# Patient Record
Sex: Female | Born: 1960 | Race: White | Hispanic: No | Marital: Married | Smoking: Never smoker
Health system: Southern US, Community
[De-identification: ages and names within clinical notes are randomized; demographics above are authoritative.]

## PROBLEM LIST (undated history)

## (undated) DIAGNOSIS — G8929 Other chronic pain: Secondary | ICD-10-CM

## (undated) DIAGNOSIS — K589 Irritable bowel syndrome without diarrhea: Secondary | ICD-10-CM

## (undated) DIAGNOSIS — G4733 Obstructive sleep apnea (adult) (pediatric): Secondary | ICD-10-CM

## (undated) DIAGNOSIS — M549 Dorsalgia, unspecified: Secondary | ICD-10-CM

## (undated) DIAGNOSIS — N390 Urinary tract infection, site not specified: Secondary | ICD-10-CM

## (undated) DIAGNOSIS — J45909 Unspecified asthma, uncomplicated: Secondary | ICD-10-CM

## (undated) DIAGNOSIS — Z9989 Dependence on other enabling machines and devices: Secondary | ICD-10-CM

## (undated) DIAGNOSIS — K602 Anal fissure, unspecified: Secondary | ICD-10-CM

## (undated) DIAGNOSIS — Z8719 Personal history of other diseases of the digestive system: Secondary | ICD-10-CM

## (undated) DIAGNOSIS — G5600 Carpal tunnel syndrome, unspecified upper limb: Secondary | ICD-10-CM

## (undated) DIAGNOSIS — R911 Solitary pulmonary nodule: Secondary | ICD-10-CM

## (undated) DIAGNOSIS — G43909 Migraine, unspecified, not intractable, without status migrainosus: Secondary | ICD-10-CM

## (undated) DIAGNOSIS — E039 Hypothyroidism, unspecified: Secondary | ICD-10-CM

## (undated) DIAGNOSIS — K648 Other hemorrhoids: Secondary | ICD-10-CM

## (undated) HISTORY — PX: OTHER SURGICAL HISTORY: SHX169

## (undated) HISTORY — DX: Dependence on other enabling machines and devices: Z99.89

## (undated) HISTORY — PX: NASAL SINUS SURGERY: SHX719

## (undated) HISTORY — DX: Dorsalgia, unspecified: M54.9

## (undated) HISTORY — PX: LYMPH NODE BIOPSY: SHX201

## (undated) HISTORY — DX: Other chronic pain: G89.29

## (undated) HISTORY — DX: Anal fissure, unspecified: K60.2

## (undated) HISTORY — DX: Solitary pulmonary nodule: R91.1

## (undated) HISTORY — DX: Urinary tract infection, site not specified: N39.0

## (undated) HISTORY — DX: Unspecified asthma, uncomplicated: J45.909

## (undated) HISTORY — DX: Carpal tunnel syndrome, unspecified upper limb: G56.00

## (undated) HISTORY — PX: EXCISION OF BREAST BIOPSY: SHX5822

## (undated) HISTORY — PX: HEMORRHOID SURGERY: SHX153

## (undated) HISTORY — PX: TONSILLECTOMY: SUR1361

## (undated) HISTORY — DX: Irritable bowel syndrome, unspecified: K58.9

## (undated) HISTORY — DX: Personal history of other diseases of the digestive system: Z87.19

## (undated) HISTORY — DX: Hypothyroidism, unspecified: E03.9

## (undated) HISTORY — DX: Other hemorrhoids: K64.8

## (undated) HISTORY — DX: Obstructive sleep apnea (adult) (pediatric): G47.33

## (undated) HISTORY — PX: TOTAL VAGINAL HYSTERECTOMY: SHX2548

## (undated) HISTORY — DX: Migraine, unspecified, not intractable, without status migrainosus: G43.909

## (undated) HISTORY — PX: BREAST EXCISIONAL BIOPSY: SUR124

---

## 1998-08-27 ENCOUNTER — Other Ambulatory Visit: Admission: RE | Admit: 1998-08-27 | Discharge: 1998-08-27 | Payer: Self-pay | Admitting: Obstetrics & Gynecology

## 1998-09-26 ENCOUNTER — Ambulatory Visit (HOSPITAL_COMMUNITY): Admission: RE | Admit: 1998-09-26 | Discharge: 1998-09-26 | Payer: Self-pay | Admitting: Obstetrics and Gynecology

## 1998-10-17 ENCOUNTER — Ambulatory Visit (HOSPITAL_COMMUNITY): Admission: RE | Admit: 1998-10-17 | Discharge: 1998-10-17 | Payer: Self-pay | Admitting: Obstetrics & Gynecology

## 1998-10-23 ENCOUNTER — Ambulatory Visit (HOSPITAL_COMMUNITY): Admission: RE | Admit: 1998-10-23 | Discharge: 1998-10-23 | Payer: Self-pay | Admitting: Obstetrics & Gynecology

## 1999-05-21 ENCOUNTER — Other Ambulatory Visit: Admission: RE | Admit: 1999-05-21 | Discharge: 1999-05-21 | Payer: Self-pay | Admitting: Obstetrics & Gynecology

## 1999-10-28 ENCOUNTER — Inpatient Hospital Stay (HOSPITAL_COMMUNITY): Admission: AD | Admit: 1999-10-28 | Discharge: 1999-10-28 | Payer: Self-pay | Admitting: Obstetrics and Gynecology

## 1999-11-04 ENCOUNTER — Encounter: Payer: Self-pay | Admitting: Obstetrics & Gynecology

## 1999-11-04 ENCOUNTER — Inpatient Hospital Stay (HOSPITAL_COMMUNITY): Admission: AD | Admit: 1999-11-04 | Discharge: 1999-11-04 | Payer: Self-pay | Admitting: Obstetrics & Gynecology

## 1999-12-12 ENCOUNTER — Encounter (INDEPENDENT_AMBULATORY_CARE_PROVIDER_SITE_OTHER): Payer: Self-pay

## 1999-12-12 ENCOUNTER — Inpatient Hospital Stay (HOSPITAL_COMMUNITY): Admission: AD | Admit: 1999-12-12 | Discharge: 1999-12-14 | Payer: Self-pay | Admitting: Obstetrics & Gynecology

## 1999-12-15 ENCOUNTER — Encounter: Admission: RE | Admit: 1999-12-15 | Discharge: 2000-02-19 | Payer: Self-pay | Admitting: Obstetrics and Gynecology

## 2000-01-20 ENCOUNTER — Other Ambulatory Visit: Admission: RE | Admit: 2000-01-20 | Discharge: 2000-01-20 | Payer: Self-pay | Admitting: Obstetrics & Gynecology

## 2000-07-14 ENCOUNTER — Observation Stay (HOSPITAL_COMMUNITY): Admission: RE | Admit: 2000-07-14 | Discharge: 2000-07-15 | Payer: Self-pay | Admitting: Obstetrics & Gynecology

## 2000-07-14 ENCOUNTER — Encounter (INDEPENDENT_AMBULATORY_CARE_PROVIDER_SITE_OTHER): Payer: Self-pay | Admitting: Specialist

## 2001-04-19 ENCOUNTER — Other Ambulatory Visit: Admission: RE | Admit: 2001-04-19 | Discharge: 2001-04-19 | Payer: Self-pay | Admitting: Obstetrics & Gynecology

## 2002-04-19 ENCOUNTER — Other Ambulatory Visit: Admission: RE | Admit: 2002-04-19 | Discharge: 2002-04-19 | Payer: Self-pay | Admitting: Obstetrics & Gynecology

## 2003-09-12 ENCOUNTER — Other Ambulatory Visit: Admission: RE | Admit: 2003-09-12 | Discharge: 2003-09-12 | Payer: Self-pay | Admitting: Obstetrics & Gynecology

## 2003-09-25 ENCOUNTER — Encounter: Admission: RE | Admit: 2003-09-25 | Discharge: 2003-09-25 | Payer: Self-pay | Admitting: Obstetrics & Gynecology

## 2003-11-04 ENCOUNTER — Encounter: Admission: RE | Admit: 2003-11-04 | Discharge: 2003-11-04 | Payer: Self-pay | Admitting: Obstetrics & Gynecology

## 2004-02-11 ENCOUNTER — Encounter (INDEPENDENT_AMBULATORY_CARE_PROVIDER_SITE_OTHER): Payer: Self-pay | Admitting: Specialist

## 2004-02-11 ENCOUNTER — Ambulatory Visit (HOSPITAL_COMMUNITY): Admission: RE | Admit: 2004-02-11 | Discharge: 2004-02-11 | Payer: Self-pay | Admitting: *Deleted

## 2004-02-11 ENCOUNTER — Ambulatory Visit (HOSPITAL_BASED_OUTPATIENT_CLINIC_OR_DEPARTMENT_OTHER): Admission: RE | Admit: 2004-02-11 | Discharge: 2004-02-11 | Payer: Self-pay | Admitting: *Deleted

## 2004-10-08 ENCOUNTER — Other Ambulatory Visit: Admission: RE | Admit: 2004-10-08 | Discharge: 2004-10-08 | Payer: Self-pay | Admitting: Obstetrics & Gynecology

## 2004-11-25 ENCOUNTER — Encounter: Admission: RE | Admit: 2004-11-25 | Discharge: 2004-11-25 | Payer: Self-pay | Admitting: Obstetrics & Gynecology

## 2004-12-25 ENCOUNTER — Encounter (INDEPENDENT_AMBULATORY_CARE_PROVIDER_SITE_OTHER): Payer: Self-pay | Admitting: Specialist

## 2004-12-25 ENCOUNTER — Ambulatory Visit (HOSPITAL_BASED_OUTPATIENT_CLINIC_OR_DEPARTMENT_OTHER): Admission: RE | Admit: 2004-12-25 | Discharge: 2004-12-25 | Payer: Self-pay | Admitting: *Deleted

## 2004-12-25 ENCOUNTER — Ambulatory Visit (HOSPITAL_COMMUNITY): Admission: RE | Admit: 2004-12-25 | Discharge: 2004-12-25 | Payer: Self-pay | Admitting: *Deleted

## 2005-02-04 ENCOUNTER — Ambulatory Visit: Payer: Self-pay | Admitting: Internal Medicine

## 2005-02-24 ENCOUNTER — Ambulatory Visit: Payer: Self-pay

## 2005-10-28 ENCOUNTER — Other Ambulatory Visit: Admission: RE | Admit: 2005-10-28 | Discharge: 2005-10-28 | Payer: Self-pay | Admitting: Obstetrics & Gynecology

## 2005-12-01 ENCOUNTER — Encounter: Admission: RE | Admit: 2005-12-01 | Discharge: 2005-12-01 | Payer: Self-pay | Admitting: Obstetrics & Gynecology

## 2006-11-30 ENCOUNTER — Encounter: Admission: RE | Admit: 2006-11-30 | Discharge: 2006-11-30 | Payer: Self-pay | Admitting: Obstetrics & Gynecology

## 2007-01-12 ENCOUNTER — Ambulatory Visit: Payer: Self-pay | Admitting: Cardiology

## 2007-02-01 ENCOUNTER — Ambulatory Visit: Payer: Self-pay | Admitting: Cardiology

## 2007-02-01 ENCOUNTER — Inpatient Hospital Stay (HOSPITAL_BASED_OUTPATIENT_CLINIC_OR_DEPARTMENT_OTHER): Admission: RE | Admit: 2007-02-01 | Discharge: 2007-02-01 | Payer: Self-pay | Admitting: Cardiology

## 2007-02-23 ENCOUNTER — Ambulatory Visit: Payer: Self-pay | Admitting: Cardiology

## 2007-06-01 ENCOUNTER — Encounter: Admission: RE | Admit: 2007-06-01 | Discharge: 2007-06-01 | Payer: Self-pay | Admitting: Obstetrics & Gynecology

## 2007-07-06 ENCOUNTER — Ambulatory Visit: Payer: Self-pay | Admitting: Internal Medicine

## 2007-07-11 ENCOUNTER — Ambulatory Visit (HOSPITAL_COMMUNITY): Admission: RE | Admit: 2007-07-11 | Discharge: 2007-07-11 | Payer: Self-pay | Admitting: Internal Medicine

## 2007-07-12 ENCOUNTER — Ambulatory Visit (HOSPITAL_COMMUNITY): Admission: RE | Admit: 2007-07-12 | Discharge: 2007-07-12 | Payer: Self-pay | Admitting: Internal Medicine

## 2007-07-25 ENCOUNTER — Ambulatory Visit: Payer: Self-pay | Admitting: Internal Medicine

## 2007-08-01 ENCOUNTER — Ambulatory Visit (HOSPITAL_COMMUNITY): Admission: RE | Admit: 2007-08-01 | Discharge: 2007-08-01 | Payer: Self-pay | Admitting: Internal Medicine

## 2007-08-11 ENCOUNTER — Ambulatory Visit: Payer: Self-pay | Admitting: Internal Medicine

## 2007-08-11 ENCOUNTER — Ambulatory Visit (HOSPITAL_COMMUNITY): Admission: RE | Admit: 2007-08-11 | Discharge: 2007-08-11 | Payer: Self-pay | Admitting: Internal Medicine

## 2007-08-17 DIAGNOSIS — R05 Cough: Secondary | ICD-10-CM

## 2007-08-17 DIAGNOSIS — R0602 Shortness of breath: Secondary | ICD-10-CM | POA: Insufficient documentation

## 2007-08-17 DIAGNOSIS — R059 Cough, unspecified: Secondary | ICD-10-CM | POA: Insufficient documentation

## 2007-08-17 DIAGNOSIS — R079 Chest pain, unspecified: Secondary | ICD-10-CM | POA: Insufficient documentation

## 2008-01-10 ENCOUNTER — Encounter: Admission: RE | Admit: 2008-01-10 | Discharge: 2008-01-10 | Payer: Self-pay | Admitting: Obstetrics & Gynecology

## 2008-02-20 ENCOUNTER — Encounter: Admission: RE | Admit: 2008-02-20 | Discharge: 2008-02-20 | Payer: Self-pay | Admitting: Obstetrics & Gynecology

## 2008-03-20 ENCOUNTER — Telehealth (INDEPENDENT_AMBULATORY_CARE_PROVIDER_SITE_OTHER): Payer: Self-pay | Admitting: *Deleted

## 2008-03-20 DIAGNOSIS — J984 Other disorders of lung: Secondary | ICD-10-CM | POA: Insufficient documentation

## 2008-07-15 ENCOUNTER — Ambulatory Visit: Payer: Self-pay | Admitting: Cardiology

## 2008-07-16 ENCOUNTER — Ambulatory Visit: Payer: Self-pay | Admitting: Internal Medicine

## 2008-08-12 ENCOUNTER — Ambulatory Visit: Payer: Self-pay | Admitting: Cardiology

## 2009-06-20 ENCOUNTER — Encounter: Admission: RE | Admit: 2009-06-20 | Discharge: 2009-06-20 | Payer: Self-pay | Admitting: Obstetrics & Gynecology

## 2009-09-22 ENCOUNTER — Ambulatory Visit: Payer: Self-pay | Admitting: Psychology

## 2009-09-27 ENCOUNTER — Encounter: Admission: RE | Admit: 2009-09-27 | Discharge: 2009-09-27 | Payer: Self-pay | Admitting: Neurology

## 2010-01-08 ENCOUNTER — Telehealth (INDEPENDENT_AMBULATORY_CARE_PROVIDER_SITE_OTHER): Payer: Self-pay | Admitting: *Deleted

## 2010-01-20 ENCOUNTER — Telehealth (INDEPENDENT_AMBULATORY_CARE_PROVIDER_SITE_OTHER): Payer: Self-pay | Admitting: *Deleted

## 2010-03-06 ENCOUNTER — Encounter: Payer: Self-pay | Admitting: Cardiology

## 2010-03-10 ENCOUNTER — Encounter: Payer: Self-pay | Admitting: Cardiology

## 2010-03-18 ENCOUNTER — Ambulatory Visit: Payer: Self-pay | Admitting: Cardiology

## 2010-03-18 DIAGNOSIS — R011 Cardiac murmur, unspecified: Secondary | ICD-10-CM | POA: Insufficient documentation

## 2010-04-13 ENCOUNTER — Ambulatory Visit: Payer: Self-pay

## 2010-04-13 ENCOUNTER — Ambulatory Visit: Payer: Self-pay | Admitting: Cardiovascular Disease

## 2010-04-13 ENCOUNTER — Encounter: Payer: Self-pay | Admitting: Cardiology

## 2010-04-13 ENCOUNTER — Ambulatory Visit (HOSPITAL_COMMUNITY): Admission: RE | Admit: 2010-04-13 | Discharge: 2010-04-13 | Payer: Self-pay | Admitting: Cardiology

## 2010-06-23 ENCOUNTER — Encounter: Admission: RE | Admit: 2010-06-23 | Discharge: 2010-06-23 | Payer: Self-pay | Admitting: Obstetrics & Gynecology

## 2010-11-22 ENCOUNTER — Encounter: Payer: Self-pay | Admitting: Obstetrics & Gynecology

## 2010-11-23 ENCOUNTER — Encounter: Payer: Self-pay | Admitting: Internal Medicine

## 2010-12-03 NOTE — Progress Notes (Signed)
Summary: office manage to call her not nurse  Phone Note Call from Patient Call back at Home Phone [phone]   Caller: Patient Reason for Call: Talk to Nurse Details for Reason: Per pt calling, doesn't want to speak with the [place] office. wants to come to [place] office if possible.  want office manager to call her not the nurse.  Initial call taken by: Lorne Skeens,  January 08, 2010 3:10 PM  Follow-up for Phone Call        Phone Call Completed

## 2010-12-03 NOTE — Progress Notes (Signed)
Summary: Talked with Ahlam on January 20, 2010  Talked with Lynden Ang on March 22,2011 she said that her husband has an appointment with Dr. Juanda Chance in [place].  I asked her if she needed too talk with me about any dissatisfaction she or her husband may have with the [facility] clinic. She said that she did not need too talk.

## 2010-12-03 NOTE — Assessment & Plan Note (Signed)
Summary: np6/jss   Primary Provider:  Dr. Vernell Leep  CC:  new patient/ pt to establish with Dr. Shirlee Latch.  Pt concerned with chest pain that has  been intermittent over the last year.  This as patient states was not a concern for Dr Andee Lineman and so she is switching physicians..  History of Present Illness: 50 yo with history of asthma and atypical chest pain presents for evaluation of chest pain.  Patient has had atypical chest pain for at least 2-3 years. In 2008, she had a normal left heart cath. She gets episodes of sharp, left-sided chest pain about 2-3 times a week.  They have increased in frequency recently. Pain lasts 5-10 minutes.  It is not associated with exertion and not associated with wheezing.  She does report some shortness of breath and chest tightness when she walks up the hill to get to her garden.  She has noticed this over the past 2 weeks since she started working in her garden.  She has not trouble with a flight of steps.    ECG: NSR, normal  Labs (5/11): creatinine 0.9, LDL 123, HDL 45, TGs 105, HCT 33.6   Current Medications (verified): 1)  Singulair 10 Mg  Tabs (Montelukast Sodium) .... At Bedtime 2)  Proventil Hfa 108 (90 Base) Mcg/act  Aers (Albuterol Sulfate) .... As Needed 3)  Xanax 0.5 Mg  Tabs (Alprazolam) .... As Needed 4)  Albuterol Sulfate 0.63 Mg/74ml  Nebu (Albuterol Sulfate) .... As Needed 5)  Topamax 100 Mg Tabs (Topiramate) .... Once Daily 6)  Cymbalta 30 Mg Cpep (Duloxetine Hcl) .... Once Daily 7)  Flexeril 10 Mg Tabs (Cyclobenzaprine Hcl) .... 1/2 Tablet Two Times A Day As Needed 8)  Ambien 10 Mg Tabs (Zolpidem Tartrate) .... 1/2 Tablet As Needed 9)  Caltrate 600 1500 Mg Tabs (Calcium Carbonate) .... Once Daily 10)  Vitamin D3 1000 Unit Caps (Cholecalciferol) .... Once Daily 11)  Align  Caps (Probiotic Product) .... Once Daily 12)  Vitamin D (Ergocalciferol) 50000 Unit Caps (Ergocalciferol) .... 2 X Weekly 13)  Miralax  Powd (Polyethylene Glycol 3350)  .... As Needed  Allergies (verified): 1)  ! * Advair 2)  ! Bactrim  Past History:  Past Medical History: 1. 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  2. CHEST PAIN, LEFT (ICD-786.50): Atypical.  Had left heart cath in 7/08 with EF 60%, normal coronaries.  3. Asthma 4. IBS 5. ? fibromyalgia 6. Depression  Family History: Sister with MV prolapse Father with MI at 71 and CHF  Social History: social stress -conflict between her and step daughter Works full time Married, lives in [place] Tobacco Use - No.  Alcohol Use - no Regular Exercise - no Drug Use - no  Review of Systems       All systems reviewed and negative except as per HPI.   Vital Signs:  Patient profile:   50 year old female Height:      60 inches Weight:      142 pounds BMI:     27.83 Pulse rate:   78 / minute Pulse rhythm:   regular BP sitting:   116 / 82  (left arm) Cuff size:   regular  Vitals Entered By: Judithe Modest CMA (Mar 18, 2010 11:45 AM)  Physical Exam  General:  Well developed, well nourished, in no acute distress. Head:  normocephalic and atraumatic Nose:  no deformity, discharge, inflammation, or lesions Mouth:  Teeth, gums and palate normal.  Oral mucosa normal. Neck:  Neck supple, no JVD. No masses, thyromegaly or abnormal cervical nodes. Lungs:  Clear bilaterally to auscultation and percussion. Heart:  Non-displaced PMI, chest non-tender; regular rate and rhythm, S1, S2 without rubs or gallops. 1/6 systolic murmur LLSB/apex.  Carotid upstroke normal, no bruit.  Pedals normal pulses. No edema, no varicosities. Abdomen:  Bowel sounds positive; abdomen soft and non-tender without masses, organomegaly, or hernias noted. No hepatosplenomegaly. Extremities:  No clubbing or cyanosis. Neurologic:  Alert and oriented x 3. Psych:  Normal affect.   Impression & Recommendations:  Problem # 1:  CHEST PAIN, LEFT (ICD-786.50) Chest pain with both  typical and atypical features (primarily atypical).  Negative cath in 2008.  Given some typical features, will have her do a stress echo to look for evidence for ischemia.   Problem # 2:  CARDIAC MURMUR (ICD-785.2) Soft systolic murmur.  Will assess valves with echo.   Other Orders: Echocardiogram (Echo) Stress Echo (Stress Echo)  Patient Instructions: 1)  Your physician has requested that you have an echocardiogram.  Echocardiography is a painless test that uses sound waves to create images of your heart. It provides your doctor with information about the size and shape of your heart and how well your heart's chambers and valves are working.  This procedure takes approximately one hour. There are no restrictions for this procedure. 2)  Your physician has requested that you have a stress echocardiogram. For further information please visit [email or web address]  Please follow instruction sheet as given. 3)  Your physician recommends that you schedule a follow-up appointment as needed with Dr Shirlee Latch.

## 2010-12-03 NOTE — Progress Notes (Signed)
Summary: Returned Catherine Hancock's phone call.  Attempted too call Catherine Hancock left message on answering machine on the following dates: March 17th; March 21st.

## 2011-03-16 NOTE — Assessment & Plan Note (Signed)
[facility]                             PULMONARY OFFICE NOTE   NAME:BURNETTRachyl, Wuebker                      MRN:          [id number]  DATE:08/11/2007                            DOB:          1961/08/18    CHIEF COMPLAINT:  Follow up for:  1. Left sided chest pain with results of bone scan.  2. Shortness of breath, cough. Follow up of methacholine challenge      test results.  3. Right lower lobe nodule. Follow up CT scan in 9-10 months to one      year.  4. Rash after Pneumovax administration at last visit on September      23rd. Status post doxycycline treatment.   HISTORY OF PRESENT ILLNESS:  Catherine Hancock returns for follow up after a  bone scan and methacholine challenge test. She is here with her husband.  They report no complaints. After her last visit on September 23rd and  Pneumovax administration, she picked up cellulitis on her left arm. I  told her to take pictures and I started her on doxycycline treatment.  She showed the pictures to me and it looks like definite cellulitis, but  it has resolved now after doxycycline treatment. In the interim, there  are no symptoms. It is essentially unchanged. She stopped the Symbicort  a few days ago prior to the methacholine challenge test, but she feels  that cough improved with symbicort. Now, even after stopping symbicort  cough has resolved. However, left sided chest pain still persists.   PAST MEDICAL HISTORY:  Unchanged from July 06, 2007.   PAST SURGICAL HISTORY:  Unchanged from July 06, 2007.   CURRENT MEDICATIONS:  Unchanged from July 06, 2007. She is not  taking Symbicort currently.   SOCIAL HISTORY:  Unchanged from July 06, 2007.   FAMILY HISTORY:  Unchanged from July 06, 2007.   REVIEW OF SYSTEMS:  Unchanged from July 06, 2007, except that her  cough is much better.   ALLERGIES:  LATEX.   PHYSICAL EXAMINATION:  VITAL SIGNS:  Weight 142 pounds,  temperature  98.3, blood pressure 112/62, pulse 82, saturation 99% on room air.  GENERAL:  Pleasant lady seated in the exam room comfortably.  CENTRAL NERVOUS SYSTEM:  Awake, alert, and oriented x3. Speech and  ambulation are normal.  CARDIOVASCULAR:  Normal heart sounds. No murmurs or gallops.  NECK:  No neck nodes. Supple. No elevated JVP.  RESPIRATORY:  Air entry equal on both sides with normal breath sounds.  CHEST:  Left lateral tenderness still present.  ABDOMEN:  Soft, nontender.  EXTREMITIES:  No cyanosis, clubbing, or edema.  SKIN:  Intact.  MUSCULOSKELETAL:  No joint deformities.   LABORATORY DATA:  1. Bone scan done on August 01, 2007. The bony tracer distribution      is normal. No focus or abnormal tracer distribution in the left      chest. It is an essentially negative bone scan.  2. Methacholine challenge test. This was done today. Baseline FEV1      prior to challenge was 2.72 liters, which was 120%  predicted. FVC      was 3.45 liters, 118% predicted. FEV1/FVC ratio was 79 (predicted      81). She had serial increments in methacholine at doses of 0.062,      0.025, 1, 4, and then 16 mg/mL. With these serial increments of      methacholine, FeV1 dropped -6% (2.57L) at 4 mg/mL, and then -40%      (FeV1 1.62L) at a dose of 16 mg/mL. Extrapolation shows that PC20      FeV1 was around 7-8 mg/mL. In summary,  per ATS criteria this is a      positive test with borderline bronchial hyper-responsiveness.   ASSESSMENT AND PLAN:  1. Left sided chest pain. Currently, cardiac catheterization is      negative, VQ scan, and bone scans are negative. Pain is chronic. I      do not know the etiology of the pain. I have reassured her that is      not lung cancer, blood clot, or malignancy locally that is causing      this pain. She and her husband felt reassured by this explanation.      She then had a question about whether she should have a breast MRI      versus a mammogram,  and whether there are breast issues that could      cause radiation of this pain. I have told her to follow this up      with a gynecologist.   1. Cough and shortness of breath. Her bronchial hyper-responsiveness      is borderline positive test. It is possible that this is either a      false positive test (more common at 4-16 mg/mL) or that the      symbicort wash-out was not adequate enough and it masked the      positivity of test at doses < 4mg /mL. The latter assumes      signficance esp so becasuse she reports symptom improvement with      symbicort. Regardless, she has no cough now. I have reassured her.      She is not taking symbicort anymore and is cough free. Told her to      retake symbicort if symptoms recur and reassess.   1. Left lower lobe nodule. She will have a CT scan of the chest, non-      contrast, in 10 to 12 months, and then follow up with me.   1. Cellulitis following Pneumovax administration. This is gone. I have      reassured her. I have told her that we filed an incident report.      She seems satisfied with the explanation.   Follow up in 10 to 12 months with CT scan of the chest, in the interim  if there are any problems, she can come back.    Kalman Shan, MD  Electronically Signed   MR/MedQ  DD: 08/13/2007  DT: 08/14/2007  Job #: [id number]

## 2011-03-16 NOTE — Assessment & Plan Note (Signed)
[facility]                             PULMONARY OFFICE NOTE   NAME:BURNETTYanitza, Shvartsman                      MRN:          [id number]  DATE:07/25/2007                            DOB:          Oct 19, 1961    CHIEF COMPLAINT:  Follow up for left sided chest pain, shortness of  breath, and cough.   HISTORY OF PRESENT ILLNESS:  Catherine Hancock is a pleasant 50 year old  female who I last saw on July 06, 2007 for left sided chronic chest  pain, dyspnea for a year, and cough for at least 2 to 3 months. At that  visit, I ordered CT scan of the chest, a VQ scan, and also pulmonary  function tests. She returns today for follow up of these test results  and also to report her status.   In the interim, she continues to have symptoms. They are unchanged. She  did start some Symbicort a few days ago and feels that her left sided  chest pain is a little bit better.   PAST MEDICAL HISTORY:  Unchanged from clinic visit of July 06, 2007.   PAST SURGICAL HISTORY:  Unchanged from July 06, 2007.   CURRENT MEDICATIONS:  Unchanged from July 06, 2007. The only  exception is that she has been taking Symbicort for a few days.   ALLERGIES:  LATEX.   SOCIAL HISTORY:  Unchanged from July 06, 2007.   FAMILY HISTORY:  Unchanged from July 06, 2007.   REVIEW OF SYSTEMS:  Unchanged from July 06, 2007.   PHYSICAL EXAMINATION:  VITAL SIGNS:  Weight 140 pounds, temperature  98.1, blood pressure 110/66, pulse 83, saturation 100% on room air.  GENERAL:  Pleasant lady sitting in the exam room comfortably.  CENTRAL NERVOUS SYSTEM:  Awake, alert, and oriented x3. Speech and  ambulation are normal.  CARDIOVASCULAR:  Normal heart sounds. No murmurs or gallops.  HEENT:  Normal, Mallampati class 1.  NECK:  No neck nodes. Supple. No elevated JVP.  RESPIRATORY:  Air entry equal on both sides, normal breath sounds.  CHEST:  Left axillary region around the 5th to  6th rib space is tender  to palpation, otherwise no deformity.  ABDOMEN:  Soft and nontender, no organomegaly, normal bowel sounds, no  mass.  EXTREMITIES:  No cyanosis, clubbing, or edema.  SKIN:  Intact.  MUSCULOSKELETAL:  No joint deformities.   LABORATORY DATA:  1. VQ scan done on July 10, 2007 is reported as very low      probability for pulmonary embolism.  2. CT scan of the chest, non-contrast, done on July 12, 2007 is      essentially normal except for a 3 millimeter nodule in the right      lower lobe.  3. Pulmonary function testing was done on July 12, 2007 at [facility] [place]. Her FEV1 was 2.96 liters 124%, FVC 3.7 liters 130%,      FEV1/FVC ratio 80, total lung capacity 4.99 liters, 119% predicted,      DLCO at 19.1 ml per millimeter of mercury per minute  which is 117%      predicted. Essentially, the PFTs are normal.   ASSESSMENT AND PLAN:   PROBLEM LIST:  1. Left sided chest pain. Again, there is no etiology yet for this      chest pain. Her prior cardiac catheterization is normal. The      current VQ scan along with her clinical pre-test probability      essentially rules out a pulmonary embolism. A CT scan of the chest      on the left side shows no pulmonary etiology for the chest pain.      Our plan is to proceed with a bone scan to see if there any bony      lesions causing chest pain.   1. PFTs are normal. She seems to get some relief with the Symbicort.      Therefore, this could be cough variant asthma. I will proceed with      getting a methacholine challenge test. I have instructed her that      she is stop her Symbicort at least 5 days before her methacholine      challenge test.   1. Right lower lobe nodule. This is an incidental finding when she was      having a CT scan of the chest for her left sided chest pain. The      nodule is 3 millimeters and is on the right sided. The probability      of lung cancer is extremely low.  The patient is extremely concerned      about lung cancer. She says that she knows of many instances where      patients with such small nodules have been dead a month later with      lung cancer. I spoke to her about the Fleischner criteria and said      that for a nodule of less than 4 millimeters in size in low risk      patients no follow up is warranted. However, given her concern and      family history of cancer, we could assume that she was high risk      and scan her chest again in one year. She is going to think about      this. I have meanwhile written an order for a CT scan of the chest      in one year. I also gave her a copy of the Fleischner criteria from      [employer] textbook of medicine.   1. Health maintenance. She wants Pneumovax and a flu shot, and this      will be administered today in the clinic.   I will see her back again after completion of the methacholine challenge  testing and bone scan.   NOTE: On 07/25/2007 I got a call from Ms. Giebler. She complained of rash  around left shoulder 1 below the pneumovax injection site. Sounded like  cellulits. I called in Doxycycline prescription  for 7 days. I advised reporteing to ER but she opted to treat it at home  with antibioitcs and would get to ER if it worsened. Incident report  filed in clinic on 07/28/2007     Kalman Shan, MD  Electronically Signed    MR/MedQ  DD: 07/25/2007  DT: 07/26/2007  Job #: [id number]
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
## 2011-03-16 NOTE — Assessment & Plan Note (Signed)
[facility]                          [facility] NOTE   Catherine Hancock, Catherine Hancock                      MRN:          [id number]  DATE:08/12/2008                            DOB:          [birth year]    PRIMARY CARE PHYSICIAN:  Dr. Corrie Mckusick.   HISTORY OF PRESENT ILLNESS:  The patient is a very pleasant 50 year old  female with a history of atypical chest pain.  She underwent a prior  cardiac catheterization, which was within normal limits.  She was also  seen by Dr. Kinnie Scales in [place] and had an EGD and colonoscopy.  She  was felt to have gastroesophageal reflux disease.  She also has a long-  standing history of irritable bowel syndrome and is on current treatment  with MiraLax and Align.  More recently, however, the patient feels very  tired and exhausted all the time.  She has also developed pain under  both breasts in a band-like pattern that is worse with certain positions  particularly when lying on the left side.  This does not worsen with  exertion.  She is concerned about this because her mother had similar  symptoms and died from lymphoma.  She is seen by pulmonary doctors for  asthma and also has a known pulmonary nodule, which is stable.  No  further followup CTs have been recommended in the meanwhile.  The  patient in addition also complains of significant insomnia,  irritability, and fatigue.   MEDICATIONS:  Prenatal vitamins, Topamax __________ p.o. daily, B  complex, vitamin D, MiraLax OTC, Align OTC.   PHYSICAL EXAMINATION:  VITAL SIGNS:  Blood pressure 102/66, heart rate  is 76, weight is 138 pounds.  NECK:  Normal carotid upstrokes and no carotid bruits.  LUNGS:  Clear breath sounds bilaterally.  HEART:  Regular rate and rhythm.  Normal S1 and S2.  No murmurs or  gallops.  ABDOMEN:  Soft, nontender.  No rebound or guarding.  Good bowel sounds.  EXTREMITIES:  No cyanosis, clubbing, or edema.   PROBLEM LIST:  1.  Normal cardiac catheterization.  2. Atypical chest pain.  3. Irritable bowel syndrome.  4. Rule out fibromyalgia.  5. Asthma.  6. History of normal cardiac catheterization.  7. History of gastroesophageal reflux and esophageal spasm.   PLAN:  1. I told the patient that I do not think her chest pain is cardiac in      nature.  2. Given some of the symptoms that she complains about and with her      profile of IBS and the fact that she has difficulty sleeping and is      irritable, I am concerned that she could have a mood disorder that      is contributing to her chest pain.  I have given her recommendation      to see Dr. Steele Sizer for an evaluation particularly to      diagnose possible mood disorder.  If this is negative, certainly a      CT scan of the abdomen can be considered, although I did not  particularly encourage the patient to do this.  3. From a cardiac perspective, however, the patient has no active      problems and can see [place] back in 1 year.      Learta Codding, MD,FACC  Electronically Signed    GED/MedQ  DD: 08/12/2008  DT: 08/13/2008  Job #: [id number]

## 2011-03-19 NOTE — Op Note (Signed)
[facility]  Patient:    Catherine Hancock, Catherine Hancock                        MRN: [id number] Adm. Date:  [id number] Attending:  Minette Headland                           Operative Report  PREOPERATIVE DIAGNOSES: 1. Slight uterine enlargement. 2. Suspected endometriosis. 3. Clinical history of menorrhagia and severe dysmenorrhea uncontrolled with    oral contraceptives.  POSTOPERATIVE DIAGNOSES: 1. Uterine enlarged consistent with adenomyosis. 2. Scattered minimal lesions consistent with endometriosis in cul-de-sac and    on anterior peritoneum overlying bladder.  OPERATIVE PROCEDURES: 1. Laparoscopy. 2. Laparoscopically-assisted vaginal hysterectomy. 3. Laparoscopic fulguration of endometriotic lesions.  SURGEON:  Freddy Finner, M.D.  ASSISTANT:  Trevor Iha, M.D.  ANESTHESIA:  General endotracheal.  INTRAOPERATIVE COMPLICATIONS:  None.  ESTIMATED INTRAOPERATIVE BLOOD LOSS:  200 cc.  INDICATIONS:  Details of the present illness are recorded in the admission note.  DESCRIPTION OF PROCEDURE:  The patient was admitted on the morning of surgery and brought to the operating room after receiving a gram of Ceftin IV and being placed on PAS hose.  She was placed in the dorsolithotomy position using the [employer].  A Betadine prep was carried out in the usual fashion.  The bladder was evacuated with a Robinson catheter.  A Hulka tenaculum was attached to the cervix under direct visualization.  Sterile d drapes were applied.  Two small incisions were made in the abdomen, one in the umbilicus, and one just above the symphysis.  Through the umbilical incision, a 12 mm disposable trocar was introduced.  Inspection with the laparoscope revealed adequate placement with no evidence of injury on entry. Pneumoperitoneum was allowed to accumulate with carbon dioxide gas.  A 5 mm trocar was placed through the lower incision.  A blunt probe and a later  the Nezhat irrigation and aspiration system probe was used through this trocar port.  Systemic examination of the pelvic and abdominal contents was carried out with findings as noted above.  The appendix was visualized and was completely normal and for that reason was left in place.  The bipolar Klepinger forceps were used through the operating channel of the laparoscope to fulgurate all visible evidence of endometriosis and some hemosiderin deposits also possibly consistent with endometriosis on the anterior peritoneum overlying the bladder.  It was felt that the mobility of the uterus would allow removal without additional stapling or other procedures laparoscopically.  Attention was then turned vaginally.  The trocars were left in place.  Gas was allowed to escape from the abdomen.  Other instruments were removed.  A posterior weighted vaginal retractor was then placed.  Deaver retractors were used to retract the anterolateral vaginal wall.  The cervix was visualized and grasped with a Judkins tenaculum after removal of the Hulka tenaculum.  A colpotomy incision was made while tenting the cul-de-sac with an Allis clamp.  The cervix was circumscribed with a scalpel to release the mucosa from the cervix.  Curved Heaneys were used to developed uterosacral pedicles on each side, which were divided sharply and ligated with 0 Monocryl in a Heaney fashion.  Bladder pedicles were taken separately, divided sharply, and ligated with 0 Monocryl.  The bladder was advanced off of the cervix. Cardinal ligament pedicles were taken with curved Heaneys, divided sharply, and ligated with  0 Monocryl.  Anterior vessel pedicles were then taken on each side, divided sharply, and ligated with 0 Monocryl.  One additional pedicle was taken above the vessels on each side, which was divided sharply and ligated with 0 Monocryl.  The uterus was delivered through the vaginal introitus.  Utero-ovarian pedicles were  cross clamped with Heaneys and the uterus removed.  Each of these pedicles were doubly ligated with free ties of 0 Monocryl followed by suture ligature of 0 Monocryl.  The angles of the vagina were anchored to the uterosacrals with a mattress suture of 0 Monocryl. The uterosacrals were plicated and the posterior peritoneum closed with a single interrupted 0 Monocryl suture.  The cuff was closed vertically with figure-of-eight of 0 Monocryl.  A Foley catheter was placed.  Attention was redirected abdominally with the laparoscope.  Minimal small bleeding sources along the dissected tissue were controlled with the bipolar without difficulty.  Irrigation was used and careful inspection revealed complete hemostasis.  Irrigating solution was aspirated.  Gas was allowed to escape from the abdomen.  All of the instruments were removed.  The incision sites were injected with 0.25% plain Marcaine for postoperative analgesia.  The umbilical incision was closed in two layers with a deep suture of 0 Vicryl placed in the fascia.  The skin incisions were both closed with interrupted subcuticular sutures of 3-0 Dexon.  Steri-Strips were applied to the lower incision.  There were no intraoperative difficulties or complications.  Counts were correct.  The estimated intraoperative blood loss was 200 cc. DD:  07/14/00 TD:  07/14/00 Job: [id number]

## 2011-03-19 NOTE — Assessment & Plan Note (Signed)
[facility]                          Catherine Hancock Catherine Hancock OFFICE NOTE   Catherine Hancock                      MRN:          [id number]  DATE:01/12/2007                            DOB:          15-Apr-1961    Catherine Hancock Catherine Hancock CONSULTATION NOTE:   PRIMARY CARDIOLOGIST:  Dr. Marcos Eke. Pradhan in [place], [place].   REASON FOR CONSULTATION:  Catherine Hancock is a very pleasant 50 year old  female, with no documented history of coronary artery disease, who is  now referred to Dr. Andee Lineman for evaluation of chest pain.   The patient was previously seen by Dr. Nicholes Mango in consultation at  our [facility] in 2006 for evaluation of chest pain and  palpitations.  Her symptoms were felt to be quite atypical and most  likely suggestive of a GI etiology.  Nevertheless, she was referred for  further evaluation with a 2-D echocardiogram which revealed normal left  ventricular size and function and no definite valvular abnormalities.  Additionally, she had an exercise stress Cardiolite during which she  walked for 7-1/2 minutes, achieved 86% of PMHR, had no associated chest  pain or significant EKG changes, and had no evidence of either scar or  ischemia by perfusion imaging.   The patient's cardiac risk factors are negative for history of  hypertension, diabetes mellitus, history of tobacco smoking,  hyperlipidemia or family history of premature coronary artery disease.   The patient relates a several year history of CONSTANT chest discomfort  localized under the left breast which is not exacerbated either by  movement, deep inspiration, or activity.  However, she tells me that  several hours following a pulmonary function test, she had worsening of  this discomfort.   Recently, the patient awoke at 3 a.m. with severe anterior chest  discomfort, described as stabbing, but not sharp, which radiated to the  left scapula, jaw and into the left arm.  There  was associated dyspnea,  nausea, but no diaphoresis or vomiting.  She took one nitroglycerin  tablet that belongs to her husband and reported immediate improvement in  her symptoms with complete resolution after approximately 20 minutes.  She has not had any of the severe chest pain, but continues to have the  same constant left chest pain that she has had for the last several  years.   The electrocardiogram today reveals NSR at 63 BPM with normal axis and  no ischemic changes.   ALLERGIES:  No known drug allergies.   HOME MEDICATIONS:  1. Prozac 20 daily.  2. Topamax of 50 daily.   PAST MEDICAL HISTORY:  1. Asthma.      a.     Reportedly recent normal pulmonary function tests.  2. History of severe headache.   SURGICAL HISTORY:  1. Status post partial hysterectomy in 2001.  2. Hemorrhoidectomy in 2005.  3. Sinus surgery in 2003.   REVIEW OF SYSTEMS:  Denies any exertional chest discomfort, but has mild  exertional dyspnea when walking up a flight of stairs.  Has occasional  reflux symptoms.  Otherwise, as noted per HPI, the remaining systems  are  negative.  The patient also reports that she has noticed transient  dizziness when turning her head from side-to-side, but no frank syncope.   SOCIAL HISTORY:  The patient is married.  Has 3 children.  Denies any  history of tobacco use.  She denies drinking alcohol.  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   FAMILY HISTORY:  Father deceased at age 60; history of CHF and  peripheral vascular disease.  Mother died at age 38; complications from  non-Hodgkin's lymphoma.  Sister, age 93, with mitral valve prolapse.   PHYSICAL EXAMINATION:  VITAL SIGNS:  Blood pressure is 111/71, pulse 64  and regular, weight 139.4.  GENERAL:  A 50 year old female sitting upright in no distress.  HEENT:  Normocephalic and atraumatic.  NECK:  Palpable bilateral carotid pulses without bruits.   No JVD.  LUNGS:  Clear to auscultation in all fields.  HEART:  Regular rate and rhythm (S1 and S2).  No murmurs, rubs or  gallops.  ABDOMEN:  Soft, nontender.  Intact bowel sounds.  EXTREMITIES:  Comparable femoral pulses without bruits.  Intact distal  pulses without edema.  NEUROLOGIC:  No focal deficits.   LABORATORY DATA:  Recent laboratory data notable for normal CBC,  electrolytes, renal function and liver enzymes.  Total cholesterol of  165, triglycerides 86, HDL 45 and LDL 103.   IMPRESSION:  1. Atypical chest pain.      a.     Suspect gastrointestinal etiology (i.e. esophageal spasm).      b.     Normal exercise stress Cardiolite in April of 2006.  2. Asthma.  3. Gastroesophageal reflux disease.  4. History of headaches.   PLAN:  Although the patient presents with symptoms which are felt to be  atypical for an ischemic etiology, and also in the context of no  significant cardiac risk factors, the patient is quite concerned about  this chest pain and has been for quite some time.  She had a normal  adequate exercise stress test in April of 2006, as well as a normal 2-D  echocardiogram at that time, as well.  Additionally, she is  hypervigilant with respect to this chest pain, given that her husband  has known coronary artery disease which was found by CT angiogram of the  chest and subsequent cardiac catheterization.  These 2 options were  presented to the patient, and she elects to proceed with definitive  exclusion of significant coronary artery disease with a cardiac  catheterization.  Therefore, we will arrange to have this scheduled in  our [facility] lab in the next few days, and the risks and benefits of the  procedure were discussed by Dr. Andee Lineman, and the patient is agreeable to  proceed.      Catherine Serpe, PA-C  Electronically Signed      Learta Codding, MD,FACC  Electronically Signed  GS/MedQ  DD: 01/12/2007  DT: 01/13/2007  Job #: [id number]   cc:   Pradeet K.  Vernell Leep, M.D.

## 2011-03-19 NOTE — Discharge Summary (Signed)
[facility]  Patient:    Catherine Hancock, Catherine Hancock                        MRN: [id number] Adm. Date:  [id number] Disc. Date: 07/15/00 Attending:  Minette Headland                           Discharge Summary  DISCHARGE DIAGNOSES: 1. Pelvic endometriosis. 2. Clinical symptoms consistent with adenomyosis of the uterus including    severe dysmenorrhea, menorrhagia unresponsive to hormonal therapy.  PROCEDURE:  Laparoscopically assisted vaginal hysterectomy, fulguration of pelvic endometriosis.  POSTOPERATIVE COMPLICATIONS:  None.  DISPOSITION:  The patient is in satisfactory improved condition at the time of her discharge. She is having adequate bowel and bladder activity. She is ambulating without difficulty and tolerating a regular diet.  She is discharged home with Percocet 5 mg to be taken 1 or 2 every 4h as needed for postoperative pain which can be used on conjunction with ibuprofen. She is to avoid heavy lifting, vaginal entry. She is to call for heavy vaginal bleeding, for severe pain or for fever above 100. She is to return to the office in approximately 2 weeks to see me for her first postoperative visit.  Details of the present illness, past history, family history, review of systems and physical exam are recorded in the admission note. Clinically, the pelvic exam was essentially normal on admission with perhaps slight enlargement of the uterus clinically and some tenderness. She is known to have symptoms consistent with endometriosis for many years.  LABORATORY DATA:  During this admission includes an admission hemoglobin of 12.5, a normal prothrombin time, normal INR, normal activated PTT, normal urinalysis. Her postoperative hemoglobin was 10.7. Histologically her surgical pathology report did not confirm adenomyosis but her symptoms are clinically consistent with this.  The patient was admitted on the morning of surgery. She was  treated perioperatively with PAS hose. She was given IV Cefotan preoperatively. Her postoperative course was without complications. She remained afebrile throughout the hospital stay. By the evening of the first postoperative day, she was ambulating without difficulty. She was having adequate bowel and bladder function. Her disposition is further noted above. She was discharged home with follow-up as noted above. DD:  07/15/00 TD:  07/15/00 Job: [id number]

## 2011-03-19 NOTE — Assessment & Plan Note (Signed)
[facility]                          [facility] NOTE   Catherine Hancock, Catherine Hancock                      MRN:          [id number]  DATE:02/23/2007                            DOB:          10-Jan-1961    HISTORY OF PRESENT ILLNESS:  The patient is a 50 year old female who was  evaluated by [name] in the office on January 12, 2007. The patient had  very atypical chest pain and we felt that this was likely GI in  etiology. However, the patient wanted to proceed with a cardiac  catheterization, which was done. The patient had no coronary artery  disease and she has normal LV function. She developed no complications  after catheterization. In the interim, she has already seen Dr. Kinnie Scales  in [place] and he will be doing EGD and colonoscopy. She was told  that he anticipates that she has significant reflux with esophageal  spasm which confirms our earlier diagnosis. The patient is reassured.  She states that her husband is going to visit me tomorrow. She has no  other cardiovascular related symptoms.   The patient's vital signs were stable. The blood pressure is 119/66,  heart rate 57.  LUNGS:  Clear.  HEART: Regular rate and rhythm.  EXTREMITIES: Revealed no swelling in the groin and no bruit. This is at  the catheterization site.   PROBLEM LIST:  1. Gastroesophageal reflux and esophageal spasm.  2. Normal cardiac catheterization.  3. History of headaches.  4. History of asthma.   PLAN:  1. The patient is doing quite well. She has no cardiovascular      symptoms.  2. The patient will followup with Dr. Kinnie Scales in [place] and at this      point does not need to return to our clinic.     Learta Codding, MD,FACC     GED/MedQ  DD: 02/23/2007  DT: 02/23/2007  Job #: [id number]

## 2011-03-19 NOTE — Cardiovascular Report (Signed)
NAMEJENNET, Catherine Hancock               ACCOUNT NO.:  [id number]   MEDICAL RECORD NO.:  [id number]          PATIENT TYPE:  OIB   LOCATION:  NA                           FACILITY:  [facility]   PHYSICIAN:  Bruce R. Juanda Chance, MD, FACCDATE OF BIRTH:  1960-12-20   DATE OF PROCEDURE:  02/01/2007  DATE OF DISCHARGE:                            CARDIAC CATHETERIZATION   CLINICAL HISTORY:  [name] is a 50 year old and has no prior history  of heart disease but has had recurrent chest pain over the past year.  She was seen in consultation by [name] and Lewayne Bunting.  In 2006, she  was seen by Nicholes Mango, and he did a 2-D echo which was normal and a  stress Cardiolite which was normal.  Her risk factors are mostly  negative, but because of persistent pain and because of her concern  about the possibility of coronary disease, a decision was made to  proceed with angiography.  Her husband has coronary artery disease and  is seen by Dr. Andee Lineman and has had stents put in by Dr. Samule Ohm.   PROCEDURE:  The procedure was performed by right femoral artery arterial  sheath and 4-French preformed coronary catheters.  A front wall arterial  pucture was performed and Omnipaque contrast was used.  The patient  tolerated procedure well and left the laboratory in satisfactory  condition.   RESULTS:  The left main coronary:  The left main coronary artery was  free of significant disease.   Left anterior descending artery:  The left anterior descending artery  gave rise to 2 large diagonal branches, a septal perforator and 2  smaller diagonal branches.  These and its branches were free of  significant disease.   The circumflex artery:  The circumflex artery was a large dominant  vessel that gave rise to a small marginal branch, large marginal branch,  two posterolateral branches and posterior descending branch.  These  vessels were free of significant disease.   Right coronary artery:  The right coronary  artery is a nondominant  vessel despite a conus branch and 2 right ventricular branches.  These  vessels were free of significant disease.   LEFT VENTRICULOGRAM:  The left ventriculogram in the RAO projection  showed good wall motion with no areas of hypokinesis.  The estimated  fraction was 60%.   CONCLUSION:  Normal coronary angiography left ventricle wall motion.   RECOMMENDATIONS:  Reassurance.  We will reassure the patient regarding  cardiac etiology for her chest pain.  Not sure if she has had a full GI  evaluation in the past and will arrange for follow-up with Dr. Andee Lineman,  and he can decide about further evaluation.      Bruce Elvera Lennox Juanda Chance, MD, [facility]  Electronically Signed     BRB/MEDQ  D:  02/01/2007  T:  02/01/2007  Job:  [id number]   cc:   Learta Codding, MD,FACC  Corrie Mckusick

## 2011-03-19 NOTE — H&P (Signed)
[facility]  Patient:    Catherine Hancock, Catherine Hancock                        MRN: [id number] Adm. Date:  07/14/00 Attending:  Freddy Finner, M.D.                         History and Physical  ADMITTING DIAGNOSIS:  Long history of severe dysmenorrhea unrelieved by oral contraceptives, long history of pelvic pain and dyspareunia, multiparity, desire for permanent sterilization.  HISTORY OF PRESENT ILLNESS:  Patient is a 50 year old white married female, gravida 4, para 3, who has long history of pelvic pain and female-type problems.  On previous occasions, she was scheduled for laparoscopy for pelvic pain but, on both occasions, was found to be pregnant and did not proceed with laparoscopy.  She recently gave birth to her third viable child and has now requested permanent surgical sterilization.  She is on oral contraceptives but continues to have menorrhagia and severe dysmenorrhea.  I am considering the options.  The patient specifically requested something definitive and asked for hysterectomy.  The option of laparoscopy with tubal ligation with delaying the more major procedure was considered with her and she refused.  She has reviewed the video in the office which describes the operative procedure and is admitted now for laparoscopically assisted vaginal hysterectomy and BSO if the ovaries are abnormal.  She has also very specifically requested appendectomy, if possible, and understands the increased risk of postoperative infection in the event that this is done.  REVIEW OF SYSTEMS:  Her current review of systems is otherwise negative  No cardiopulmonary, GI, or GU complaints.  PAST MEDICAL HISTORY:  Patient has no known significant medical illnesses. Previous operative procedures include a D&C for a missed AB in 2000.  She has given birth to three infants vaginally.  She has never had a blood transfusion.  ALLERGIES:  She has no known allergies to  medications.  SOCIAL HISTORY:  She does not use cigarettes.  FAMILY HISTORY:  Noncontributory.  PHYSICAL EXAMINATION:  HEENT:  Grossly within normal limits.  VITAL SIGNS:  Blood pressure in the office was 110/80.  NECK:  Thyroid gland is not palpably enlarged.  BREASTS:  Exam is considered to be normal.  No palpable masses, no skin change.  HEART:  Normal sinus rhythm without murmurs, rubs, or gallops.  LUNGS:  Clear to auscultation.  ABDOMEN:  Soft and nontender without appreciable organomegaly or palpable masses.  EXTREMITIES:  Without clubbing, cyanosis, or edema.  PELVIC:  External genitalia, vagina, and cervix were normal.  There is no significant descent of the bladder or rectum.  No evidence of prolapse.  There is a very small urethrocele but no cystocele.  The adnexa are free of palpable masses on bimanual.  The uterus is upper normal in size.  There are no palpable adnexal masses.  RECTAL:  Palpably normal and rectovaginal exam confirms these findings.  ASSESSMENT:  Long history of menorrhagia, dysmenorrhea, pelvic pain, and dyspareunia, a request for definitive surgical intervention.  PLAN:  Laparoscopically assisted vaginal hysterectomy, possible bilateral salpingo-oophorectomy, possible appendectomy. DD:  07/13/00 TD:  07/13/00 Job: [id number]

## 2011-03-19 NOTE — Op Note (Signed)
Catherine Hancock, Catherine Hancock               ACCOUNT NO.:  [id number]   MEDICAL RECORD NO.:  [id number]          PATIENT TYPE:  AMB   LOCATION:  [facility]                         FACILITY:  [facility]   PHYSICIAN:  Vikki Ports, MDDATE OF BIRTH:  August 03, 1961   DATE OF PROCEDURE:  12/25/2004  DATE OF DISCHARGE:                                 OPERATIVE REPORT   PREOPERATIVE DIAGNOSES:  Grade 3 internal hemorrhoids with prolapse.   POSTOPERATIVE DIAGNOSES:  Grade 3 internal hemorrhoids with prolapse.   PROCEDURE:  PPH rectopexy.   SURGEON:  Vikki Ports, MD   ANESTHESIA:  General.   DESCRIPTION OF PROCEDURE:  The patient was taken to the operating room,  placed supine position and after adequate general anesthesia was induced,  using an endotracheal tube, the patient was placed in the prone jackknife  position.  Digital rectal dilatation was accomplished, the perianal and  rectal prep were undertaken and then three hemorrhoidal bundles were  injected with 0.5 Marcaine with Wydase. Internal and external sphincters  were injected with additional 0.5 Marcaine. A 2-0 Prolene pursestring suture  was placed 5 cm proximal to the dentate line in the rectal mucosa.  The  stapler was then placed within the rectum and pursestring was tied down. An  additional stitch was used to complete the pursestring's proximity to the  anvil. It was closed, held in place for 30 seconds. The vagina was checked.  It was fired and removed. I had a good 2 cm segment of hemorrhoidal tissue  with no evidence of muscularis.  The staple line was tediously inspected.  There was no bleeding. Gelfoam packing was placed. The patient tolerated the  procedure well and went to PACU in good condition.      KRH/MEDQ  D:  12/25/2004  T:  12/25/2004  Job:  [id number]

## 2011-03-19 NOTE — Op Note (Signed)
NAME:  Catherine Hancock, Catherine Hancock                         ACCOUNT NO.:  [id number]   MEDICAL RECORD NO.:  [id number]                   PATIENT TYPE:  AMB   LOCATION:  [facility]                                  FACILITY:  [facility]   PHYSICIAN:  Vikki Ports, M.D.         DATE OF BIRTH:  1961-07-23   DATE OF PROCEDURE:  02/11/2004  DATE OF DISCHARGE:                                 OPERATIVE REPORT   PREOPERATIVE DIAGNOSIS:  Left axillary mass.   POSTOPERATIVE DIAGNOSIS:  Left axillary mass.   PROCEDURE:  Excision of left axillary lymph node.   SURGEON:  Vikki Ports, M.D.   ANESTHESIA:  General.   DESCRIPTION OF PROCEDURE:  The patient was taken to the operating room and  placed in the supine position.  After adequate anesthesia was induced using  laryngeal mask, the left axilla was prepped and draped in normal sterile  fashion.  A transverse incision was made over the axilla and the palpable  mass.  Dissecting down through subcutaneous fat into the axillary space, an  enlarged but soft lymph node was identified, grasped with a Babcock, and  excised.  This was accomplished using Bovie electrocautery, adequate  hemostasis was assured, and the skin was closed with a subcuticular 4-0  Monocryl.  Steri-Strips and sterile dressing were applied.  The patient  tolerated the procedure well and went to PACU in good condition.                                               Vikki Ports, M.D.    KRH/MEDQ  D:  02/11/2004  T:  02/12/2004  Job:  [id number]

## 2011-04-23 ENCOUNTER — Other Ambulatory Visit: Payer: Self-pay | Admitting: Obstetrics & Gynecology

## 2011-04-23 DIAGNOSIS — Z1231 Encounter for screening mammogram for malignant neoplasm of breast: Secondary | ICD-10-CM

## 2011-06-28 ENCOUNTER — Ambulatory Visit
Admission: RE | Admit: 2011-06-28 | Discharge: 2011-06-28 | Disposition: A | Discharge status: Home / Self Care | Payer: BC Managed Care – PPO | Source: Ambulatory Visit | Attending: Obstetrics & Gynecology | Admitting: Obstetrics & Gynecology

## 2011-06-28 DIAGNOSIS — Z1231 Encounter for screening mammogram for malignant neoplasm of breast: Secondary | ICD-10-CM

## 2011-12-28 ENCOUNTER — Ambulatory Visit (INDEPENDENT_AMBULATORY_CARE_PROVIDER_SITE_OTHER): Payer: BC Managed Care – PPO | Admitting: Physician Assistant

## 2011-12-28 ENCOUNTER — Encounter: Payer: Self-pay | Admitting: Physician Assistant

## 2011-12-28 ENCOUNTER — Encounter: Payer: Self-pay | Admitting: *Deleted

## 2011-12-28 VITALS — BP 131/90 | HR 93 | Ht 60.0 in | Wt 160.0 lb

## 2011-12-28 DIAGNOSIS — R079 Chest pain, unspecified: Secondary | ICD-10-CM

## 2011-12-28 NOTE — Patient Instructions (Signed)
Your physician recommends that you schedule a follow-up appointment as needed with Dr Shirlee Latch (unless we need to see you after your test) Your physician recommends that you have lab work drawn today (Troponin) Your physician has requested that you have an echocardiogram. Echocardiography is a painless test that uses sound waves to create images of your heart. It provides your doctor with information about the size and shape of your heart and how well your heart's chambers and valves are working. This procedure takes approximately one hour. There are no restrictions for this procedure. Your physician has requested that you have a stress echocardiogram. For further information please visit [email or web address] Please follow instruction sheet as given. Your physician recommends that you schedule a follow-up appointment with your primary care Dr for your blood pressure Try Prevacid daily for 1 month -- if chest pain gets better see your primary care Dr for GERD -- if it does not help Try Symbicort daily for 1 month -- if chest pain gets better see Dr Marchelle Gearing (pulmonary)

## 2011-12-28 NOTE — Assessment & Plan Note (Signed)
Atypical.  She did have a prolonged episode 2 nights ago.  This was quite concerning for her.  I recommend getting a troponin today.  If this is negative we can proceed with a stress echocardiogram for further evaluation.  We discussed her normal heart catheterization in 2008.  We also discussed that the likelihood of developing rapidly progressive coronary disease is quite low.  If her stress test is negative, I would recommend further followup with her PCP.  She has several other reasons for chest pain.  One possibility is acid reflux disease.  She takes Prevacid as needed.  I suggest that she try taking Prevacid for one month on a daily basis.  If this does not help her symptoms, she should try to use Symbicort on a daily basis to cover for the possibility of her asthma causing her symptoms.  She currently takes Symbicort as needed.  She can follow up with Dr. Marca Ancona as needed.

## 2011-12-28 NOTE — Progress Notes (Signed)
[address] [place], [place]  [address] Phone: [phone] Fax:  [phone]  Date:  12/28/2011   Name:  Catherine Hancock       DOB:  01/24/1961 MRN:  [id number]  PCP:  Dr. Vernell Leep Primary Cardiologist:  Dr. Marca Ancona  Primary Electrophysiologist:  None    History of Present Illness: Catherine Hancock is a 51 y.o. female who presents for chest pain.  She has a history of atypical chest pain for the last several years.  She had a heart catheterization in 2000 and demonstrated normal coronary arteries and an EF of 60%.  She was evaluated by Dr. Shirlee Latch in 5/11.  She underwent an echocardiogram 6/11: EF 55-60%.  Stress echo images showed no ischemia.    She states she's had left-sided chest pain for the last 5-6 years.  She says is constant.  Nothing she does makes it better or worse.  She has shortness of breath that she relates to her asthma.  This is no worse.  She denies orthopnea, PND or edema. She denies pleuritic chest pains.  She denies chest pains associated with meals.  Several nights ago, she developed worsening chest pain that was in a different location.  She took her husband's nitroglycerin without relief.  It lasted for several hours.  She has not had a recurrence.  She denies any dyspnea or fatigue since that time.  She notes occasional skipping of her heartbeat.  She has noted increased blood pressures over the last several months.  She is going through menopause.  Past Medical History  Diagnosis Date  . Pulmonary nodule, right   . Chest pain     LHC 7/08: EF 60%, normal coronaries; ETT-Echo 6/11: EF 55-60%, no ischemia  . Asthma   . IBS (irritable bowel syndrome)   . Fibromyalgia   . Depression     Current Outpatient Prescriptions  Medication Sig Dispense Refill  . ALPRAZolam (XANAX) 0.5 MG tablet PRN      . BLACK COHOSH PO Take 1 tablet by mouth daily.      . cyclobenzaprine (FLEXERIL) 10 MG tablet Take 10 mg by mouth as needed.      .  montelukast (SINGULAIR) 10 MG tablet Take 10 mg by mouth at bedtime.      . Prenat-FeFum-Doc-FA-DHA w/o A (NEXA PLUS) 29-1.25-350 MG CAPS Take 1 tablet by mouth Daily.      Marland Kitchen zolpidem (AMBIEN) 10 MG tablet Take 1 tablet by mouth as needed.        Allergies: Allergies  Allergen Reactions  . Advair Hfa   . Sulfamethoxazole W/Trimethoprim     REACTION: GI upset    History  Substance Use Topics  . Smoking status: Never Smoker   . Smokeless tobacco: Not on file  . Alcohol Use: Not on file     Family History  Problem Relation Age of Onset  . Mitral valve prolapse Sister      ROS:  Please see the history of present illness.   She has chronic headaches and sees neurology.  She gained about 15 pounds in last several months.  She notes her major symptoms of asthma are shortness of breath and coughing.  She also has symptoms of IBS.  All other systems reviewed and negative.   PHYSICAL EXAM: VS:  BP 131/90  Pulse 93  Ht 5' (1.524 m)  Wt 160 lb (72.576 kg)  BMI 31.25 kg/m2 Repeat blood pressure by me on the  right 120/90  Well nourished, well developed, in no acute distress HEENT: normal Neck: no JVD Vascular: No carotid bruits Endocrine: No thyromegaly Cardiac:  normal S1, S2; RRR; no murmur Lungs:  clear to auscultation bilaterally, no wheezing, rhonchi or rales Abd: soft, nontender, no hepatomegaly Ext: no edema Skin: warm and dry Neuro:  CNs 2-12 intact, no focal abnormalities noted Psych: Normal affect  EKG:  Sinus rhythm, heart rate 93, normal axis, nonspecific ST-T wave changes, No significant change when compared to prior tracing  ASSESSMENT AND PLAN:

## 2012-01-10 ENCOUNTER — Other Ambulatory Visit: Payer: Self-pay

## 2012-01-10 ENCOUNTER — Ambulatory Visit (HOSPITAL_COMMUNITY): Payer: BC Managed Care – PPO | Attending: Cardiology

## 2012-01-10 DIAGNOSIS — R0609 Other forms of dyspnea: Secondary | ICD-10-CM | POA: Insufficient documentation

## 2012-01-10 DIAGNOSIS — I079 Rheumatic tricuspid valve disease, unspecified: Secondary | ICD-10-CM | POA: Insufficient documentation

## 2012-01-10 DIAGNOSIS — R0989 Other specified symptoms and signs involving the circulatory and respiratory systems: Secondary | ICD-10-CM | POA: Insufficient documentation

## 2012-01-10 DIAGNOSIS — R072 Precordial pain: Secondary | ICD-10-CM

## 2012-01-10 DIAGNOSIS — R079 Chest pain, unspecified: Secondary | ICD-10-CM

## 2012-01-11 ENCOUNTER — Telehealth: Payer: Self-pay | Admitting: *Deleted

## 2012-01-11 NOTE — Telephone Encounter (Signed)
lmom 2D echo normal, stress echo will be done 01/14/12 . Catherine Hancock

## 2012-01-11 NOTE — Telephone Encounter (Signed)
Message copied by Tarri Fuller on Tue Jan 11, 2012 12:10 PM ------      Message from: [place], [place] T      Created: Mon Jan 10, 2012  5:11 PM       Normal LV function      Stress Echo pending      Tereso Newcomer, PA-C  5:11 PM 01/10/2012

## 2012-01-14 ENCOUNTER — Ambulatory Visit (HOSPITAL_COMMUNITY): Payer: BC Managed Care – PPO

## 2012-01-14 ENCOUNTER — Ambulatory Visit (HOSPITAL_COMMUNITY): Payer: BC Managed Care – PPO | Attending: Physician Assistant

## 2012-01-14 ENCOUNTER — Telehealth: Payer: Self-pay | Admitting: *Deleted

## 2012-01-14 DIAGNOSIS — R079 Chest pain, unspecified: Secondary | ICD-10-CM

## 2012-01-14 DIAGNOSIS — R011 Cardiac murmur, unspecified: Secondary | ICD-10-CM | POA: Insufficient documentation

## 2012-01-14 DIAGNOSIS — R5381 Other malaise: Secondary | ICD-10-CM | POA: Insufficient documentation

## 2012-01-14 DIAGNOSIS — R072 Precordial pain: Secondary | ICD-10-CM | POA: Insufficient documentation

## 2012-01-14 NOTE — Telephone Encounter (Signed)
Message copied by Tarri Fuller on Fri Jan 14, 2012  2:57 PM ------      Message from: [place], [place] T      Created: Fri Jan 14, 2012 12:19 PM       Please inform patient stress test normal.      Tereso Newcomer, PA-C  12:19 PM 01/14/2012

## 2012-01-14 NOTE — Telephone Encounter (Signed)
lmom stress test normal . Danielle Rankin

## 2012-01-24 ENCOUNTER — Ambulatory Visit (INDEPENDENT_AMBULATORY_CARE_PROVIDER_SITE_OTHER): Payer: BC Managed Care – PPO | Admitting: Cardiology

## 2012-01-24 ENCOUNTER — Encounter: Payer: Self-pay | Admitting: Cardiology

## 2012-01-24 VITALS — BP 138/82 | HR 82 | Ht 60.0 in | Wt 161.1 lb

## 2012-01-24 DIAGNOSIS — R079 Chest pain, unspecified: Secondary | ICD-10-CM

## 2012-01-24 MED ORDER — METOPROLOL TARTRATE 50 MG PO TABS: ORAL_TABLET | ORAL | Status: DC

## 2012-01-24 MED ORDER — ISOSORBIDE MONONITRATE ER 30 MG PO TB24: 30.0000 mg | ORAL_TABLET | Freq: Every day | ORAL | Status: DC

## 2012-01-24 NOTE — Progress Notes (Signed)
PCP: Dr. Vernell Leep  51 yo with history of asthma and atypical chest pain presents for evaluation of chest pain. Patient has had atypical chest pain for at least 5 years. In 2008, she had a normal left heart cath. I had her do a stress echo in 6/11, which was normal.  In 2/13, she had an episode of severe chest pain at rest and was seen by the PA in our office.  She took NTG with the episode but had no relief of chest pain. A repeat stress echo was done, which showed normal wall motion by echo but significant ST depression in recovery on ECG.    Patient continues to have periodic left-sided chest aching. This is similar to the past in character but is becoming more frequent.  It is not related to exertion and can really happen at any time.  She has been taking a PPI.  This helps heartburn, but the pain she calls her "heart pain" is different and not affected by PPI.  She has also been more short of breath with exertion recently (stairs, inclines) and has been using her Symbicort fairly regularly.  She says that the chest pain is not associated with wheezing.   Labs (5/11): creatinine 0.9, LDL 123, HDL 45, TGs 105, HCT 33.6   Allergies (verified):  1) ! * Advair  2) ! Bactrim   Past Medical History:  1. 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  2. CHEST PAIN, LEFT (ICD-786.50): Atypical. Had left heart cath in 7/08 with EF 60%, normal coronaries.  Stress echo (6/11) showed no evidence for ischemia.  Stress echo (3/13) with EF 60-65%, no significant valvular abnormalities, no wall motion abnormalities at rest or with stress but there was significant ST depression in recovery on the ECG.  3. Asthma  4. IBS  5. ? fibromyalgia  6. Depression   Family History:  Sister with MV prolapse  Father with MI at 20 and CHF   Social History:  Works full time  Married, lives in [place]  Tobacco Use - No.  Alcohol Use - no  Regular Exercise - no  Drug Use - no    Review of Systems  All systems reviewed and negative except as per HPI.   Current Outpatient Prescriptions  Medication Sig Dispense Refill  . ALPRAZolam (XANAX) 0.5 MG tablet PRN      . BLACK COHOSH PO Take 1 tablet by mouth daily.      . cyclobenzaprine (FLEXERIL) 10 MG tablet Take 10 mg by mouth as needed.      . montelukast (SINGULAIR) 10 MG tablet Take 10 mg by mouth at bedtime.      . Prenat-FeFum-Doc-FA-DHA w/o A (NEXA PLUS) 29-1.25-350 MG CAPS Take 1 tablet by mouth Daily.      Marland Kitchen zolpidem (AMBIEN) 10 MG tablet Take 1 tablet by mouth as needed.      . isosorbide mononitrate (IMDUR) 30 MG 24 hr tablet Take 1 tablet (30 mg total) by mouth daily.  90 tablet  3  . metoprolol (LOPRESSOR) 50 MG tablet Take one tablet 2 hours before the CT scan  1 tablet  0  . DISCONTD: isosorbide mononitrate (IMDUR) 30 MG 24 hr tablet Take 1 tablet (30 mg total) by mouth daily.  30 tablet  11    BP 138/82  Pulse 82  Ht 5' (1.524 m)  Wt 161 lb 1.9 oz (73.084 kg)  BMI 31.47 kg/m2 General: NAD Neck: No JVD,  no thyromegaly or thyroid nodule.  Lungs: Clear to auscultation bilaterally with normal respiratory effort. CV: Nondisplaced PMI.  Heart regular S1/S2, no S3/S4, no murmur.  No peripheral edema.  No carotid bruit.  Normal pedal pulses.  Abdomen: Soft, nontender, no hepatosplenomegaly, no distention.  Neurologic: Alert and oriented x 3.  Psych: Normal affect. Extremities: No clubbing or cyanosis.

## 2012-01-24 NOTE — Assessment & Plan Note (Signed)
Episodes of atypical chest pain, becoming more frequent recently.  She had a negative cath in 2008.  She had a stress echo this month that showed no wall motion abnormalities but did show significant ST depression during recovery on the stress ECG.  She is concerned because her chest pain seems to be becoming more frequent.  It is certainly possible, especially given the ECG changes, that she has microvascular angina (Syndrome X).  Another consideration would be coronary vasospasm.  PPI has not helped.  The chest pain also does not appear to be related to wheezing (she has asthma as well).  Given ongoing symptoms, will start her on Imdur 30 mg daily to see if this helps.  I will also arrange for a coronary CT angiogram to assess more directly for coronary stenosis given the abnormal ECG response on the stress echo. She will need a BMET prior to the test.

## 2012-01-24 NOTE — Patient Instructions (Signed)
Start Imdur(isosorbide) 30mg  daily.  Your physician recommends that you have lab work today--BMET 786.50  Your physician has requested that you have cardiac CT. Cardiac computed tomography (CT) is a painless test that uses an x-ray machine to take clear, detailed pictures of your heart. For further information please visit [email or web address] Please follow instruction sheet as given.   Take metoprolol 50mg  two hours before the CT is scheduled to be done.  Your physician recommends that you schedule a follow-up appointment as needed with Dr Shirlee Latch.

## 2012-01-25 LAB — BASIC METABOLIC PANEL
Creatinine, Ser: 0.6 mg/dL (ref 0.4–1.2)
Glucose, Bld: 78 mg/dL (ref 70–99)
Potassium: 4 mEq/L (ref 3.5–5.1)
Sodium: 139 mEq/L (ref 135–145)

## 2012-01-26 ENCOUNTER — Telehealth: Payer: Self-pay | Admitting: *Deleted

## 2012-01-26 NOTE — Telephone Encounter (Signed)
Called patient to inform her that we are still waiting on the insurance company approve the Cardiac CT.

## 2012-02-02 ENCOUNTER — Telehealth: Payer: Self-pay | Admitting: Cardiology

## 2012-02-02 NOTE — Telephone Encounter (Signed)
LMTCB

## 2012-02-02 NOTE — Telephone Encounter (Signed)
New msg Pt was calling back about test to be scheduled Please call her back

## 2012-02-08 ENCOUNTER — Telehealth: Payer: Self-pay | Admitting: Cardiology

## 2012-02-08 NOTE — Telephone Encounter (Signed)
Spoke with pt

## 2012-02-08 NOTE — Telephone Encounter (Signed)
Spoke with pt. Pt states after starting Imdur she developed diarrhea. She took Imdur for 4-5 days but had to stop due to diarrhea. Her diarrhea has resolved since stopping Imdur. I will forward to Dr Shirlee Latch.

## 2012-02-08 NOTE — Telephone Encounter (Signed)
Ok, await CTA.

## 2012-02-08 NOTE — Telephone Encounter (Signed)
Patient would like nurse to call [facility] [employer] [phone] and give them patient member # [id number] DOB 06/06/1961 Name Catherine Hancock Test# Diagnosis code  For precert for CTA test patient is having.   Patient can be reached at hm# for additional information [phone].

## 2012-02-08 NOTE — Telephone Encounter (Signed)
Catherine Hancock took care of this and talked with pt.

## 2012-02-08 NOTE — Telephone Encounter (Signed)
LMTCB

## 2012-02-08 NOTE — Telephone Encounter (Signed)
Fu call °Patient returning your call °

## 2012-02-09 ENCOUNTER — Encounter: Payer: Self-pay | Admitting: *Deleted

## 2012-02-23 ENCOUNTER — Encounter (HOSPITAL_COMMUNITY): Payer: Self-pay

## 2012-02-23 ENCOUNTER — Ambulatory Visit (HOSPITAL_COMMUNITY)
Admission: RE | Admit: 2012-02-23 | Discharge: 2012-02-23 | Disposition: A | Discharge status: Home / Self Care | Payer: BC Managed Care – PPO | Source: Ambulatory Visit | Attending: Cardiology | Admitting: Cardiology

## 2012-02-23 DIAGNOSIS — R079 Chest pain, unspecified: Secondary | ICD-10-CM

## 2012-02-23 DIAGNOSIS — R911 Solitary pulmonary nodule: Secondary | ICD-10-CM | POA: Insufficient documentation

## 2012-02-23 DIAGNOSIS — J9819 Other pulmonary collapse: Secondary | ICD-10-CM | POA: Insufficient documentation

## 2012-02-23 MED ORDER — IOHEXOL 350 MG/ML SOLN
80.0000 mL | Freq: Once | INTRAVENOUS | Status: AC | PRN
  Administered 2012-02-23: 80 mL via INTRAVENOUS

## 2012-02-23 MED ORDER — SODIUM CHLORIDE 0.9 % IV SOLN
Freq: Once | INTRAVENOUS | Status: AC
  Administered 2012-02-23: 250 mL via INTRAVENOUS

## 2012-02-23 MED ORDER — METOPROLOL TARTRATE 1 MG/ML IV SOLN
INTRAVENOUS | Status: AC
  Filled 2012-02-23: qty 5

## 2012-02-23 MED ORDER — NITROGLYCERIN 0.4 MG SL SUBL
SUBLINGUAL_TABLET | SUBLINGUAL | Status: AC
  Administered 2012-02-23: 0.4 mg
  Filled 2012-02-23: qty 25

## 2012-03-06 ENCOUNTER — Telehealth: Payer: Self-pay | Admitting: *Deleted

## 2012-03-06 MED ORDER — NITROGLYCERIN 0.4 MG SL SUBL: 0.4000 mg | SUBLINGUAL_TABLET | SUBLINGUAL | Status: AC | PRN

## 2012-03-06 NOTE — Telephone Encounter (Signed)
Spoke with pt. Will send in a prescription for NTG

## 2012-03-06 NOTE — Telephone Encounter (Signed)
LMTCB

## 2012-03-06 NOTE — Telephone Encounter (Signed)
That would be fine 

## 2012-03-06 NOTE — Telephone Encounter (Signed)
Pt had normal cardiac CT scan 02/24/12. Pt was given Imdur 01/24/12 before CT scan was done but had to stop after about 4 days due to diarrhea. Pt states she was under some stress last week and had chest pain similar to previous chest pain. She took someone else's NTG and it relieved her chest pain. Pt is requesting a prescription for NTG. I will forward to Dr Shirlee Latch for review.

## 2012-05-02 ENCOUNTER — Encounter: Payer: Self-pay | Admitting: Internal Medicine

## 2012-05-12 ENCOUNTER — Other Ambulatory Visit: Payer: Self-pay | Admitting: Otolaryngology

## 2012-05-12 DIAGNOSIS — R599 Enlarged lymph nodes, unspecified: Secondary | ICD-10-CM

## 2012-05-12 DIAGNOSIS — J029 Acute pharyngitis, unspecified: Secondary | ICD-10-CM

## 2012-05-15 ENCOUNTER — Other Ambulatory Visit: Payer: BC Managed Care – PPO

## 2012-05-16 ENCOUNTER — Ambulatory Visit
Admission: RE | Admit: 2012-05-16 | Discharge: 2012-05-16 | Disposition: A | Discharge status: Home / Self Care | Payer: BC Managed Care – PPO | Source: Ambulatory Visit | Attending: Otolaryngology | Admitting: Otolaryngology

## 2012-05-16 DIAGNOSIS — J029 Acute pharyngitis, unspecified: Secondary | ICD-10-CM

## 2012-05-16 DIAGNOSIS — R599 Enlarged lymph nodes, unspecified: Secondary | ICD-10-CM

## 2012-05-30 ENCOUNTER — Encounter: Payer: Self-pay | Admitting: Gastroenterology

## 2012-05-30 ENCOUNTER — Ambulatory Visit (AMBULATORY_SURGERY_CENTER): Payer: BC Managed Care – PPO

## 2012-05-30 ENCOUNTER — Other Ambulatory Visit: Payer: Self-pay | Admitting: Internal Medicine

## 2012-05-30 VITALS — Ht 60.0 in | Wt 138.8 lb

## 2012-05-30 DIAGNOSIS — Z800 Family history of malignant neoplasm of digestive organs: Secondary | ICD-10-CM

## 2012-05-30 DIAGNOSIS — Z1211 Encounter for screening for malignant neoplasm of colon: Secondary | ICD-10-CM

## 2012-05-30 DIAGNOSIS — R1012 Left upper quadrant pain: Secondary | ICD-10-CM

## 2012-05-30 MED ORDER — MOVIPREP 100 G PO SOLR: ORAL | Status: DC

## 2012-05-30 NOTE — Progress Notes (Signed)
Dr Juanda Chance spoke with patient while she was in the office today with her husband (who is our patient as well). Patient was told that she needed an office visit due to abdominal pain before we would do colonoscopy. Her colonoscopy on 07/05/12 was cancelled. Dr Juanda Chance states that patient does not need an office visit prior to colonoscopy. We will schedule colonoscopy at [facility] since the dates patient originally had were cancelled. She has a previsit today with Belenda Cruise, RN. Scheduled procedure with Noreene Larsson @ [facility]. Booking number [id number].

## 2012-05-31 ENCOUNTER — Encounter: Payer: Self-pay | Admitting: Internal Medicine

## 2012-06-07 ENCOUNTER — Ambulatory Visit: Payer: BC Managed Care – PPO | Admitting: Gastroenterology

## 2012-06-09 ENCOUNTER — Encounter (HOSPITAL_COMMUNITY)
Admission: RE | Disposition: A | Discharge status: Home / Self Care | Payer: Self-pay | Source: Ambulatory Visit | Attending: Internal Medicine

## 2012-06-09 ENCOUNTER — Encounter (HOSPITAL_COMMUNITY): Payer: Self-pay

## 2012-06-09 ENCOUNTER — Ambulatory Visit (HOSPITAL_COMMUNITY)
Admission: RE | Admit: 2012-06-09 | Discharge: 2012-06-09 | Disposition: A | Discharge status: Home / Self Care | Payer: BC Managed Care – PPO | Source: Ambulatory Visit | Attending: Internal Medicine | Admitting: Internal Medicine

## 2012-06-09 ENCOUNTER — Encounter: Payer: Self-pay | Admitting: Internal Medicine

## 2012-06-09 DIAGNOSIS — K648 Other hemorrhoids: Secondary | ICD-10-CM | POA: Insufficient documentation

## 2012-06-09 DIAGNOSIS — Z1211 Encounter for screening for malignant neoplasm of colon: Secondary | ICD-10-CM

## 2012-06-09 DIAGNOSIS — R1012 Left upper quadrant pain: Secondary | ICD-10-CM

## 2012-06-09 DIAGNOSIS — Z800 Family history of malignant neoplasm of digestive organs: Secondary | ICD-10-CM | POA: Insufficient documentation

## 2012-06-09 DIAGNOSIS — R109 Unspecified abdominal pain: Secondary | ICD-10-CM | POA: Insufficient documentation

## 2012-06-09 DIAGNOSIS — K589 Irritable bowel syndrome without diarrhea: Secondary | ICD-10-CM | POA: Insufficient documentation

## 2012-06-09 DIAGNOSIS — J45909 Unspecified asthma, uncomplicated: Secondary | ICD-10-CM | POA: Insufficient documentation

## 2012-06-09 HISTORY — PX: COLONOSCOPY: SHX5424

## 2012-06-09 SURGERY — COLONOSCOPY
Anesthesia: Moderate Sedation

## 2012-06-09 MED ORDER — SODIUM CHLORIDE 0.9 % IV SOLN
INTRAVENOUS | Status: DC
  Administered 2012-06-09: 500 mL via INTRAVENOUS

## 2012-06-09 MED ORDER — HYOSCYAMINE SULFATE 0.125 MG SL SUBL: 0.1250 mg | SUBLINGUAL_TABLET | SUBLINGUAL | Status: DC | PRN

## 2012-06-09 MED ORDER — MIDAZOLAM HCL 10 MG/2ML IJ SOLN
INTRAMUSCULAR | Status: AC
  Filled 2012-06-09: qty 4

## 2012-06-09 MED ORDER — MIDAZOLAM HCL 5 MG/5ML IJ SOLN
INTRAMUSCULAR | Status: DC | PRN
  Administered 2012-06-09: 2 mg via INTRAVENOUS
  Administered 2012-06-09: 1 mg via INTRAVENOUS
  Administered 2012-06-09: 2 mg via INTRAVENOUS
  Administered 2012-06-09: 1 mg via INTRAVENOUS

## 2012-06-09 MED ORDER — SODIUM CHLORIDE 0.9 % IV SOLN: INTRAVENOUS | Status: DC

## 2012-06-09 MED ORDER — DIPHENHYDRAMINE HCL 50 MG/ML IJ SOLN
INTRAMUSCULAR | Status: AC
  Filled 2012-06-09: qty 1

## 2012-06-09 MED ORDER — FENTANYL CITRATE 0.05 MG/ML IJ SOLN
INTRAMUSCULAR | Status: DC | PRN
  Administered 2012-06-09 (×3): 25 ug via INTRAVENOUS

## 2012-06-09 MED ORDER — FENTANYL CITRATE 0.05 MG/ML IJ SOLN
INTRAMUSCULAR | Status: AC
  Filled 2012-06-09: qty 4

## 2012-06-09 NOTE — H&P (Signed)
Catherine Hancock 12/16/1960 MRN [id number]        History of Present Illness:  This is a 51 yo female presenting for recall colonoscopy   Past Medical History  Diagnosis Date  . Pulmonary nodule, right   . Chest pain     LHC 7/08: EF 60%, normal coronaries; ETT-Echo 6/11: EF 55-60%, no ischemia  . Asthma   . IBS (irritable bowel syndrome)   . CTS (carpal tunnel syndrome)     bil   Past Surgical History  Procedure Date  . Total vaginal hysterectomy   . Nasal sinus surgery     1992  . Tonsillectomy     1981  . Breast nodule surg     right breast nodule and left lymph node removed in 1994  . Lymph node biopsy     left breast  . Hemorrhoid surgery     2007    reports that she has never smoked. She does not have any smokeless tobacco history on file. She reports that she does not drink alcohol or use illicit drugs. family history includes Breast cancer in her maternal aunt; Colon cancer in her maternal aunts, maternal grandmother, and maternal uncle; Heart disease in her maternal grandfather, paternal grandfather, and paternal uncles; Mitral valve prolapse in her sister; and Ovarian cancer in her maternal aunt. Allergies  Allergen Reactions  . Fluticasone-Salmeterol   . Sulfamethoxazole W-Trimethoprim     REACTION: GI upset        Review of Systems:  The remainder of the 10 point ROS is negative except as outlined in H&P   Physical Exam: General appearance  Well developed, in no distress. Eyes- non icteric. HEENT nontraumatic, normocephalic. Mouth no lesions, tongue papillated, no cheilosis. Neck supple without adenopathy, thyroid not enlarged, no carotid bruits, no JVD. Lungs Clear to auscultation bilaterally. Cor normal S1, normal S2, regular rhythm, no murmur,  quiet precordium. Abdomen: soft, normal bowl sounds Rectal:deferred to colonoscopy Extremities no pedal edema. Skin no lesions. Neurological alert and oriented x 3. Psychological normal mood and  affect.  Assessment and Plan: Recall colonoscopy   06/09/2012 Lina Sar

## 2012-06-09 NOTE — Op Note (Signed)
[facility] [address] [place], [place]  [address]  COLONOSCOPY PROCEDURE REPORT  PATIENT:  Catherine Hancock, Catherine Hancock  MR#:  [id number] BIRTHDATE:  1961-07-06, 51 yrs. old  GENDER:  female ENDOSCOPIST:  Hedwig Morton. Juanda Chance, MD REF. BY: PROCEDURE DATE:  06/09/2012 PROCEDURE:  Colonoscopy 28413 ASA CLASS:  Class II INDICATIONS:  Abdominal pain, colorectal cancer screening, average risk grandfather with colon cancer LUQ abd. pain MEDICATIONS:   These medications were titrated to patient response per physician's verbal order, Versed 6 mg, Fentanyl 75 mcg, Benadryl 25  DESCRIPTION OF PROCEDURE:   After the risks and benefits and of the procedure were explained, informed consent was obtained. Digital rectal exam was performed and revealed no rectal masses. The Pentax Ped Colon [id number] endoscope was introduced through the anus and advanced to the cecum, which was identified by both the appendix and ileocecal valve.  The quality of the prep was good, using MoviPrep.  The instrument was then slowly withdrawn as the colon was fully examined. <<PROCEDUREIMAGES>>  FINDINGS:  No polyps or cancers were seen (see image1, image2, image4, and image5).  Internal Hemorrhoids were found. Retroflexed views in the rectum revealed no abnormalities.    The scope was then withdrawn from the patient and the procedure completed.  COMPLICATIONS:  None ENDOSCOPIC IMPRESSION: 1) No polyps or cancers 2) Internal hemorrhoids RECOMMENDATIONS: rial of Align 1 po qd, Levsin SL.125 mg prn q 4 hrs OVisit  REPEAT EXAM:  In 10 year(s) for.  ______________________________ Hedwig Morton. Juanda Chance, MD  CC:  n. eSIGNED:   Hedwig Morton. Imanuel Pruiett at 06/09/2012 09:33 AM  Jimmy Picket, [phone]

## 2012-06-12 ENCOUNTER — Encounter (HOSPITAL_COMMUNITY): Payer: Self-pay | Admitting: Internal Medicine

## 2012-06-14 ENCOUNTER — Encounter: Payer: BC Managed Care – PPO | Admitting: Internal Medicine

## 2012-06-28 ENCOUNTER — Other Ambulatory Visit: Payer: Self-pay | Admitting: Obstetrics & Gynecology

## 2012-06-28 DIAGNOSIS — N644 Mastodynia: Secondary | ICD-10-CM

## 2012-06-28 DIAGNOSIS — N63 Unspecified lump in unspecified breast: Secondary | ICD-10-CM

## 2012-07-04 ENCOUNTER — Other Ambulatory Visit: Payer: Self-pay | Admitting: Otolaryngology

## 2012-07-04 DIAGNOSIS — J029 Acute pharyngitis, unspecified: Secondary | ICD-10-CM

## 2012-07-04 DIAGNOSIS — H9209 Otalgia, unspecified ear: Secondary | ICD-10-CM

## 2012-07-04 DIAGNOSIS — R599 Enlarged lymph nodes, unspecified: Secondary | ICD-10-CM

## 2012-07-05 ENCOUNTER — Ambulatory Visit: Payer: BC Managed Care – PPO | Admitting: Gastroenterology

## 2012-07-05 ENCOUNTER — Encounter: Payer: BC Managed Care – PPO | Admitting: Internal Medicine

## 2012-07-05 ENCOUNTER — Encounter: Payer: Self-pay | Admitting: *Deleted

## 2012-07-06 ENCOUNTER — Ambulatory Visit
Admission: RE | Admit: 2012-07-06 | Discharge: 2012-07-06 | Disposition: A | Discharge status: Home / Self Care | Payer: BC Managed Care – PPO | Source: Ambulatory Visit | Attending: Obstetrics & Gynecology | Admitting: Obstetrics & Gynecology

## 2012-07-06 ENCOUNTER — Other Ambulatory Visit: Payer: Self-pay | Admitting: Obstetrics & Gynecology

## 2012-07-06 ENCOUNTER — Ambulatory Visit
Admission: RE | Admit: 2012-07-06 | Discharge: 2012-07-06 | Disposition: A | Discharge status: Home / Self Care | Payer: BC Managed Care – PPO | Source: Ambulatory Visit | Attending: Otolaryngology | Admitting: Otolaryngology

## 2012-07-06 DIAGNOSIS — J029 Acute pharyngitis, unspecified: Secondary | ICD-10-CM

## 2012-07-06 DIAGNOSIS — N644 Mastodynia: Secondary | ICD-10-CM

## 2012-07-06 DIAGNOSIS — H9209 Otalgia, unspecified ear: Secondary | ICD-10-CM

## 2012-07-06 DIAGNOSIS — R599 Enlarged lymph nodes, unspecified: Secondary | ICD-10-CM

## 2012-07-06 MED ORDER — IOHEXOL 300 MG/ML  SOLN
75.0000 mL | Freq: Once | INTRAMUSCULAR | Status: AC | PRN
  Administered 2012-07-06: 75 mL via INTRAVENOUS

## 2012-07-25 ENCOUNTER — Ambulatory Visit: Payer: BC Managed Care – PPO | Admitting: Internal Medicine

## 2012-08-31 ENCOUNTER — Encounter: Payer: Self-pay | Admitting: *Deleted

## 2012-09-20 ENCOUNTER — Ambulatory Visit (INDEPENDENT_AMBULATORY_CARE_PROVIDER_SITE_OTHER): Payer: BC Managed Care – PPO | Admitting: Internal Medicine

## 2012-09-20 ENCOUNTER — Encounter: Payer: Self-pay | Admitting: Internal Medicine

## 2012-09-20 VITALS — BP 110/80 | HR 88 | Ht 60.0 in | Wt 137.4 lb

## 2012-09-20 DIAGNOSIS — R1012 Left upper quadrant pain: Secondary | ICD-10-CM

## 2012-09-20 NOTE — Progress Notes (Signed)
Catherine Hancock 12-29-1960 MRN [id number]  History of Present Illness:  This is a 51 year old white female with persistent left costal margin discomfort which radiates through to her back and occurs independent of eating or position. She had a colonoscopy in August 2013 to examine the splenic flexure. The exam was normal except for internal hemorrhoids. She has some improvement of the discomfort with Levsin sublingual but she is still having sharp intermittent pain under the left rib cage going to her back. Her mother died of non-Hodgkin's lymphoma and patient is concerned about the possibility of malignancy. She was losing weight initially but has now stabilized. Her current weight is up 137 pounds. She has lactose intolerance and was recently evaluated in [facility] for multiple multisystemic complaints and there were no serious findings.   Past Medical History  Diagnosis Date  . Pulmonary nodule, right   . Chest pain     LHC 7/08: EF 60%, normal coronaries; ETT-Echo 6/11: EF 55-60%, no ischemia  . Asthma   . IBS (irritable bowel syndrome)   . CTS (carpal tunnel syndrome)     bil  . Internal hemorrhoids   . Migraines   . Hypothyroidism   . Chronic back pain   . Asthma    Past Surgical History  Procedure Date  . Total vaginal hysterectomy   . Nasal sinus surgery     1992  . Tonsillectomy     1981  . Breast nodule surg     right breast nodule and left lymph node removed in 1994  . Lymph node biopsy     left breast  . Hemorrhoid surgery     2007  . Colonoscopy 06/09/2012    Procedure: COLONOSCOPY;  Surgeon: Hart Carwin, MD;  Location: [facility];  Service: Endoscopy;  Laterality: N/A;    reports that she has never smoked. She does not have any smokeless tobacco history on file. She reports that she drinks alcohol. She reports that she does not use illicit drugs. family history includes Breast cancer in her maternal aunt; COPD in her father; Colon cancer in her maternal aunt,  maternal grandmother, and maternal uncle; Heart disease in her maternal grandfather, paternal grandfather, and paternal uncle; Mitral valve prolapse in her sister; Ovarian cancer in her maternal aunt; and Prostate cancer in her father. Allergies  Allergen Reactions  . Fluticasone-Salmeterol Other (See Comments)    Blurry vision  . Sulfamethoxazole W-Trimethoprim     REACTION: GI upset        Review of Systems:  The remainder of the 10 point ROS is negative except as outlined in H&P   Physical Exam: General appearance  Well developed, in no distress. Eyes- non icteric. HEENT nontraumatic, normocephalic. Mouth no lesions, tongue papillated, no cheilosis. Neck supple without adenopathy, thyroid not enlarged, no carotid bruits, no JVD. Lungs Clear to auscultation bilaterally. Cor normal S1, normal S2, regular rhythm, no murmur,  quiet precordium. Abdomen: Soft with minimal tenderness in left upper quadrants. No pain on inspiration. No enlargement of the spleen. No CVA tenderness. Bowel sounds are normal active. Rectal: Not repeated. Extremities no pedal edema. Skin no lesions. Neurological alert and oriented x 3. Psychological normal mood and affect.  Assessment and Plan:  Problem #1 Chronic left upper quadrant abdominal discomfort. Patient has had a negative colonoscopy. Patient raises several multisystemic concerns. We will go ahead with a CT scan of the abdomen and pelvis with attention to the left upper quadrant. We will look for a  space-occupying lesion, adenopathy or anatomic abnormality which would explain her discomfort. She is to continue on her IBS regimen.   09/20/2012 Lina Sar

## 2012-09-20 NOTE — Patient Instructions (Addendum)
You have been scheduled for a CT scan of the abdomen and pelvis at [facility] ([address]---this is in the same building as [facility]).   You are scheduled on 09/21/12 at 9:00 am. You should arrive 15 minutes prior to your appointment time for registration. Please follow the written instructions below on the day of your exam:  WARNING: IF YOU ARE ALLERGIC TO IODINE/X-RAY DYE, PLEASE NOTIFY RADIOLOGY IMMEDIATELY AT [phone]! YOU WILL BE GIVEN A 13 HOUR PREMEDICATION PREP.  1) Do not eat or drink anything after 5:00 am (4 hours prior to your test) 2) You have been given 2 bottles of oral contrast to drink. The solution may taste better if refrigerated, but do NOT add ice or any other liquid to this solution. Shake well before drinking.    Drink 1 bottle of contrast @ 7:00 am (2 hours prior to your exam)  Drink 1 bottle of contrast @ 8:00 am (1 hour prior to your exam)  You may take any medications as prescribed with a small amount of water except for the following: Metformin, Glucophage, Glucovance, Avandamet, Riomet, Fortamet, Actoplus Met, Janumet, Glumetza or Metaglip. The above medications must be held the day of the exam AND 48 hours after the exam.  The purpose of you drinking the oral contrast is to aid in the visualization of your intestinal tract. The contrast solution may cause some diarrhea. Before your exam is started, you will be given a small amount of fluid to drink. Depending on your individual set of symptoms, you may also receive an intravenous injection of x-ray contrast/dye. Plan on being at [facility] for 30 minutes or long, depending on the type of exam you are having performed.  This test typically takes 30-45 minutes to complete.  If you have any questions regarding your exam or if you need to reschedule, you may call the CT department at [phone] between the hours of 8:00 am and 5:00 pm,  Monday-Friday.  ________________________________________________________________________  CC: Dr Corrie Mckusick

## 2012-09-21 ENCOUNTER — Ambulatory Visit (INDEPENDENT_AMBULATORY_CARE_PROVIDER_SITE_OTHER)
Admission: RE | Admit: 2012-09-21 | Discharge: 2012-09-21 | Disposition: A | Discharge status: Home / Self Care | Payer: BC Managed Care – PPO | Source: Ambulatory Visit | Attending: Internal Medicine | Admitting: Internal Medicine

## 2012-09-21 DIAGNOSIS — R1012 Left upper quadrant pain: Secondary | ICD-10-CM

## 2012-09-21 MED ORDER — IOHEXOL 300 MG/ML  SOLN
100.0000 mL | Freq: Once | INTRAMUSCULAR | Status: AC | PRN
  Administered 2012-09-21: 100 mL via INTRAVENOUS

## 2013-01-18 ENCOUNTER — Other Ambulatory Visit: Payer: Self-pay | Admitting: Obstetrics & Gynecology

## 2013-01-18 DIAGNOSIS — N63 Unspecified lump in unspecified breast: Secondary | ICD-10-CM

## 2013-01-29 ENCOUNTER — Ambulatory Visit
Admission: RE | Admit: 2013-01-29 | Discharge: 2013-01-29 | Disposition: A | Discharge status: Home / Self Care | Payer: BC Managed Care – PPO | Source: Ambulatory Visit | Attending: Obstetrics & Gynecology | Admitting: Obstetrics & Gynecology

## 2013-01-29 DIAGNOSIS — N63 Unspecified lump in unspecified breast: Secondary | ICD-10-CM

## 2013-05-15 ENCOUNTER — Other Ambulatory Visit: Payer: Self-pay

## 2013-05-15 DIAGNOSIS — Z1231 Encounter for screening mammogram for malignant neoplasm of breast: Secondary | ICD-10-CM

## 2013-07-09 ENCOUNTER — Ambulatory Visit: Payer: BC Managed Care – PPO

## 2013-07-24 ENCOUNTER — Ambulatory Visit
Admission: RE | Admit: 2013-07-24 | Discharge: 2013-07-24 | Disposition: A | Discharge status: Home / Self Care | Payer: BC Managed Care – PPO | Source: Ambulatory Visit

## 2013-07-24 DIAGNOSIS — Z1231 Encounter for screening mammogram for malignant neoplasm of breast: Secondary | ICD-10-CM

## 2015-01-13 ENCOUNTER — Encounter: Payer: Self-pay | Admitting: Neurology

## 2015-01-13 ENCOUNTER — Ambulatory Visit (INDEPENDENT_AMBULATORY_CARE_PROVIDER_SITE_OTHER): Payer: BLUE CROSS/BLUE SHIELD | Admitting: Neurology

## 2015-01-13 VITALS — BP 122/76 | HR 77 | Ht 60.0 in | Wt 141.0 lb

## 2015-01-13 DIAGNOSIS — R519 Headache, unspecified: Secondary | ICD-10-CM

## 2015-01-13 DIAGNOSIS — R51 Headache: Secondary | ICD-10-CM

## 2015-01-13 DIAGNOSIS — G9389 Other specified disorders of brain: Secondary | ICD-10-CM

## 2015-01-13 DIAGNOSIS — G939 Disorder of brain, unspecified: Secondary | ICD-10-CM

## 2015-01-13 MED ORDER — ELETRIPTAN HYDROBROMIDE 40 MG PO TABS: 40.0000 mg | ORAL_TABLET | ORAL | Status: DC | PRN

## 2015-01-13 MED ORDER — TOPIRAMATE 100 MG PO TABS: 100.0000 mg | ORAL_TABLET | Freq: Two times a day (BID) | ORAL | Status: DC

## 2015-01-13 MED ORDER — CYCLOBENZAPRINE HCL 10 MG PO TABS: 10.0000 mg | ORAL_TABLET | Freq: Three times a day (TID) | ORAL | Status: DC | PRN

## 2015-01-13 NOTE — Progress Notes (Signed)
[facility]    Provider:  Dr Jaynee Eagles Referring Provider: Tobe Sos, MD Primary Care Physician:  Tobe Sos, MD  CC:  Migraine  HPI:  Catherine Hancock is a 54 y.o. female here as a referral from Dr. Shon Millet for migraines. She is a former patient of Dr. Erling Cruz. She has a thousand crickets in her head and a loud pitched noise in her head. The noises wake her up. It is all over her head. She has been very dizzy for 3 weeks. She has sharp pains in the right side of her head. The sound is on the top of her head on the right. In the last year it has been worse. Her vision is worsening, she is buying magnifying glasses for everything, more blurry, no double vision, Sharp pain. Also in the back of the head. He gave her flexeril tid, that helps with the headaches. Imitrex doesn't help, her face feels on fire when she takes it. She feels like someone poured gasoline on her head. She was also given hydrocodone for acute management. She has the headaches daily, worse with changes in weather. + light sensitivity, + photophobia, + nausea.  They last for several hours, 7-8/10. Headaches are worsening.   Review of Systems: Patient complains of symptoms per HPI as well as the following symptoms: No CP, no SOB Pertinent negatives per HPI. All others negative.   History   Social History  . Marital Status: Married    Spouse Name: Doren Custard  . Number of Children: 3  . Years of Education: 12   Occupational History  . Not on file.   Social History Main Topics  . Smoking status: Never Smoker   . Smokeless tobacco: Not on file  . Alcohol Use: Yes     Comment: socially  . Drug Use: No  . Sexual Activity: Not on file   Other Topics Concern  . Not on file   Social History Narrative   Live at home with husband and son.   Right handed.   Caffeine use: drinks 1 pepsi/day        Family History  Problem Relation Age of Onset  . Mitral valve prolapse Sister   . Colon  cancer Maternal Aunt     x 4  . Colon cancer Maternal Uncle   . Colon cancer Maternal Grandmother   . Heart disease Maternal Grandfather   . Breast cancer Maternal Aunt   . Ovarian cancer Maternal Aunt   . Heart disease Paternal Uncle     x 4  . Heart disease Paternal Grandfather   . Prostate cancer Father   . COPD Father     Past Medical History  Diagnosis Date  . Pulmonary nodule, right   . Chest pain     LHC 7/08: EF 60%, normal coronaries; ETT-Echo 6/11: EF 55-60%, no ischemia  . Asthma   . IBS (irritable bowel syndrome)   . CTS (carpal tunnel syndrome)     bil  . Internal hemorrhoids   . Migraines   . Hypothyroidism   . Chronic back pain   . Asthma     Past Surgical History  Procedure Laterality Date  . Total vaginal hysterectomy    . Nasal sinus surgery      1992  . Tonsillectomy      1981  . Breast nodule surg      right breast nodule and left lymph node removed in 1994  . Lymph node biopsy  left breast  . Hemorrhoid surgery      2007  . Colonoscopy  06/09/2012    Procedure: COLONOSCOPY;  Surgeon: Lafayette Dragon, MD;  Location: [facility];  Service: Endoscopy;  Laterality: N/A;    Current Outpatient Prescriptions  Medication Sig Dispense Refill  . ALPRAZolam (XANAX) 0.5 MG tablet PRN    . budesonide-formoterol (SYMBICORT) 80-4.5 MCG/ACT inhaler Inhale 1 puff into the lungs 2 (two) times daily as needed.     . montelukast (SINGULAIR) 10 MG tablet Take 10 mg by mouth at bedtime.    . polyethylene glycol powder (GLYCOLAX/MIRALAX) powder Take 17 g by mouth as needed.    . Prenat-FeFum-Doc-FA-DHA w/o A (NEXA PLUS) 29-1.25-350 MG CAPS Take 1 tablet by mouth Daily.    Marland Kitchen topiramate (TOPAMAX) 100 MG tablet Take 50 mg by mouth. Take one pill in am and 2 at bedtime    . zolpidem (AMBIEN) 10 MG tablet Take 1 tablet by mouth as needed.    . Azelastine HCl 0.15 % SOLN Place 1 Squirt into the nose as needed.    . DULoxetine (CYMBALTA) 20 MG capsule Take 20 mg by  mouth at bedtime.    Marland Kitchen HYDROcodone-acetaminophen (NORCO/VICODIN) 5-325 MG per tablet Take 1 tablet by mouth as needed.    . hyoscyamine (LEVSIN/SL) 0.125 MG SL tablet Place 1 tablet (0.125 mg total) under the tongue every 4 (four) hours as needed for cramping. 30 tablet 2  . lansoprazole (PREVACID) 30 MG capsule Take 30 mg by mouth as needed.     . nitroGLYCERIN (NITROSTAT) 0.4 MG SL tablet Place 1 tablet (0.4 mg total) under the tongue every 5 (five) minutes as needed for chest pain. 25 tablet 12  . PROAIR HFA 108 (90 BASE) MCG/ACT inhaler Take 2 puffs by mouth 4 (four) times daily. PRN    . SYNTHROID 75 MCG tablet Take 75 mcg by mouth daily.     No current facility-administered medications for this visit.    Allergies as of 01/13/2015 - Review Complete 01/13/2015  Allergen Reaction Noted  . Fluticasone-salmeterol Other (See Comments)   . Sulfamethoxazole-trimethoprim      Vitals: BP 122/76 mmHg  Pulse 77  Ht 5' (1.524 m)  Wt 141 lb (63.957 kg)  BMI 27.54 kg/m2 Last Weight:  Wt Readings from Last 1 Encounters:  01/13/15 141 lb (63.957 kg)   Last Height:   Ht Readings from Last 1 Encounters:  01/13/15 5' (1.524 m)    Physical exam: Exam: Gen: NAD, conversant, well nourised, obese, well groomed                     CV: RRR, no MRG. No Carotid Bruits. No peripheral edema, warm, nontender Eyes: Conjunctivae clear without exudates or hemorrhage  Neuro: Detailed Neurologic Exam  Speech:    Speech is normal; fluent and spontaneous with normal comprehension.  Cognition:    The patient is oriented to person, place, and time;     recent and remote memory intact;     language fluent;     normal attention, concentration,     fund of knowledge Cranial Nerves:    The pupils are equal, round, and reactive to light. The fundi are normal and spontaneous venous pulsations are present. Visual fields are full to finger confrontation. Extraocular movements are intact. Trigeminal  sensation is intact and the muscles of mastication are normal. The face is symmetric. The palate elevates in the midline. Hearing intact. Voice is normal.  Shoulder shrug is normal. The tongue has normal motion without fasciculations.   Coordination:    Normal finger to nose and heel to shin. Normal rapid alternating movements.   Gait:    Heel-toe and tandem gait are normal.   Motor Observation:    No asymmetry, no atrophy, and no involuntary movements noted. Tone:    Normal muscle tone.    Posture:    Posture is normal. normal erect    Strength:    Strength is V/V in the upper and lower limbs.      Sensation: intact to LT     Reflex Exam:  DTR's:    Deep tendon reflexes in the upper and lower extremities are normal bilaterally.   Toes:    The toes are downgoing bilaterally.   Clonus:    Clonus is absent.      Assessment/Plan:    Catherine Hancock is a 54 y.o. female here as a referral from Dr. Shon Millet for migraines. She is a former patient of Dr. Erling Cruz. She has been very dizzy for 3 weeks. She has sharp pains in the right side of her head. The sound is on the top of her head on the right. In the last year it has been worse. Her vision is worsening, she is buying magnifying glasses for everything, more blurry, no double vision, Sharp pain.She has the headaches daily, worse with changes in weather. + light sensitivity, + photophobia, + nausea.  They last for several hours, 7-8/10. Headaches are worsening.  Chronic migraines:  Increase Topamax 100mg  twice daily. Relpax at onset of headache. Please take one tablet at the onset of your headache. If it does not improve the symptoms please take one additional tablet. Do not take more then 2 tablets in 24hrs. Do not take use more then 2  Days in a week. MRI of the brain     Sarina Ill, MD  [facility] [address] [place], [place] [address]  Phone [phone] Fax [phone]  A total of 30  minutes was spent face-to-face with this patient. Over half this time was spent on counseling patient on the migraine diagnosis and different diagnostic and therapeutic options available.

## 2015-01-13 NOTE — Patient Instructions (Addendum)
Overall you are doing fairly well but I do want to suggest a few things today:   Remember to drink plenty of fluid, eat healthy meals and do not skip any meals. Try to eat protein with a every meal and eat a healthy snack such as fruit or nuts in between meals. Try to keep a regular sleep-wake schedule and try to exercise daily, particularly in the form of walking, 20-30 minutes a day, if you can.   As far as your medications are concerned, I would like to suggest: Topamax 100mg  twice daily. Relpax at onset of headache. Please take one tablet at the onset of your headache. If it does not improve the symptoms please take one additional tablet. Do not take more then 2 tablets in 24hrs. Do not take use more then 2  Days in a week.  As far as diagnostic testing: MRI of the brain w/wo contrast, labs  I would like to see you back in 4-6 months, sooner if we need to. Please call us with any interim questions, concerns, problems, updates or refill requests.   Please also call us for any test results so we can go over those with you on the phone.  My clinical assistant and will answer any of your questions and relay your messages to me and also relay most of my messages to you.   Our phone number is [phone]. We also have an after hours call service for urgent matters and there is a physician on-call for urgent questions. For any emergencies you know to call 911 or go to the nearest emergency room

## 2015-01-14 ENCOUNTER — Telehealth: Payer: Self-pay | Admitting: *Deleted

## 2015-01-14 LAB — COMPREHENSIVE METABOLIC PANEL
A/G RATIO: 2.1 (ref 1.1–2.5)
ALK PHOS: 93 IU/L (ref 39–117)
ALT: 9 IU/L (ref 0–32)
AST: 17 IU/L (ref 0–40)
Albumin: 4.4 g/dL (ref 3.5–5.5)
BUN / CREAT RATIO: 17 (ref 9–23)
BUN: 11 mg/dL (ref 6–24)
Bilirubin Total: 0.3 mg/dL (ref 0.0–1.2)
CO2: 19 mmol/L (ref 18–29)
CREATININE: 0.66 mg/dL (ref 0.57–1.00)
Calcium: 9.7 mg/dL (ref 8.7–10.2)
Chloride: 107 mmol/L (ref 97–108)
GFR calc Af Amer: 116 mL/min/{1.73_m2} (ref >59)
GFR calc non Af Amer: 100 mL/min/{1.73_m2} (ref >59)
GLOBULIN, TOTAL: 2.1 g/dL (ref 1.5–4.5)
Glucose: 95 mg/dL (ref 65–99)
Potassium: 4.2 mmol/L (ref 3.5–5.2)
SODIUM: 143 mmol/L (ref 134–144)
Total Protein: 6.5 g/dL (ref 6.0–8.5)

## 2015-01-14 NOTE — Telephone Encounter (Signed)
Talked with pt about normal lab results. Pt verbalized understanding.

## 2015-01-22 ENCOUNTER — Ambulatory Visit (INDEPENDENT_AMBULATORY_CARE_PROVIDER_SITE_OTHER): Payer: BLUE CROSS/BLUE SHIELD

## 2015-01-22 DIAGNOSIS — G9389 Other specified disorders of brain: Secondary | ICD-10-CM

## 2015-01-22 DIAGNOSIS — G939 Disorder of brain, unspecified: Secondary | ICD-10-CM

## 2015-01-22 DIAGNOSIS — R519 Headache, unspecified: Secondary | ICD-10-CM

## 2015-01-22 DIAGNOSIS — R51 Headache: Secondary | ICD-10-CM

## 2015-01-22 MED ORDER — GADOPENTETATE DIMEGLUMINE 469.01 MG/ML IV SOLN: 14.0000 mL | Freq: Once | INTRAVENOUS | Status: AC | PRN

## 2015-01-24 ENCOUNTER — Telehealth: Payer: Self-pay | Admitting: *Deleted

## 2015-01-24 NOTE — Telephone Encounter (Signed)
Patient is returning a call regarding MRI results.  °

## 2015-01-24 NOTE — Telephone Encounter (Signed)
Left message for pt to call us back. Gave [facility] office hours and phone number.

## 2015-02-03 ENCOUNTER — Other Ambulatory Visit: Payer: Self-pay | Admitting: Obstetrics & Gynecology

## 2015-02-04 LAB — CYTOLOGY - PAP

## 2015-02-11 ENCOUNTER — Telehealth: Payer: Self-pay | Admitting: Neurology

## 2015-02-11 NOTE — Telephone Encounter (Signed)
Patient needing name of contrast from MRI on 01/22/15 due to having allergic reaction and would like to add to allergy list.  Please call and advise.

## 2015-02-12 NOTE — Telephone Encounter (Signed)
Left message for patient to let her know the contrast dye was called gadolinium contrast and I told her I added it to her allergy list. Told her to call back if she had any questions. Gave [facility] phone number.

## 2015-02-12 NOTE — Telephone Encounter (Signed)
It is gadolinium contrast, please add that to her allergies thank you

## 2015-03-12 ENCOUNTER — Ambulatory Visit (INDEPENDENT_AMBULATORY_CARE_PROVIDER_SITE_OTHER): Payer: BLUE CROSS/BLUE SHIELD | Admitting: Neurology

## 2015-03-12 ENCOUNTER — Telehealth: Payer: Self-pay | Admitting: *Deleted

## 2015-03-12 ENCOUNTER — Encounter: Payer: Self-pay | Admitting: Neurology

## 2015-03-12 ENCOUNTER — Ambulatory Visit
Admission: RE | Admit: 2015-03-12 | Discharge: 2015-03-12 | Disposition: A | Discharge status: Home / Self Care | Payer: BLUE CROSS/BLUE SHIELD | Source: Ambulatory Visit | Attending: Neurology | Admitting: Neurology

## 2015-03-12 VITALS — BP 119/69 | HR 85 | Ht 60.0 in | Wt 146.5 lb

## 2015-03-12 DIAGNOSIS — M1380 Other specified arthritis, unspecified site: Secondary | ICD-10-CM

## 2015-03-12 DIAGNOSIS — M25511 Pain in right shoulder: Secondary | ICD-10-CM

## 2015-03-12 DIAGNOSIS — I00 Rheumatic fever without heart involvement: Secondary | ICD-10-CM | POA: Diagnosis not present

## 2015-03-12 DIAGNOSIS — T508X5A Adverse effect of diagnostic agents, initial encounter: Secondary | ICD-10-CM

## 2015-03-12 NOTE — Telephone Encounter (Signed)
Called patient to let her know her xray of her right shoulder was normal and Dr. Jaynee Eagles will be happy to order MRI if her symptoms continue. I told her I would call her once her labwork is ready. Pt verbalized understanding.

## 2015-03-12 NOTE — Progress Notes (Signed)
[facility]    Provider:  Dr Jaynee Eagles Referring Provider: Tobe Sos, MD Primary Care Physician:  Tobe Sos, MD  CC:  Joint pain  HPI:  Catherine Hancock is a 54 y.o. female here as a follow up  She is here today aftre having an MRI of the brain. She has had MRi contrast in the past but always via IV. This time she was injected with a needle. She went shopping afterwards. She felt like she was going to collapse. Not fatigued, not lightheaded, no pain. She felt an unusual weakness all over. She can't explain it. 2 days later her hleft arm from the elbow "locked up".  When she moved her hands or arms she could hear her bones.  The day before she was in the labor room with her daughter and thought maybe it was due to fatigue. The muscles hardened in the left arm. Her shoulder hurt the next morning. Her eft shoulder "locked up" she couldn't raise it, she says she "screamed, and hollared and cried" all the way to [place], when questioned she states this was not an expression she really was screaming and crying. She did not stop at the ED. She got a knot on the right wrist and it went away. No skin problems currently or rashes.  It is still happening every other day or every day. They are migrating. She is eating motrin like it is candy. She is urinating a lot and it hurts a lot. She has migrating joint paint and the right shoulder is the worst. "locked up" for three days. Every day she is having a joint problem. Had issues with the left hip. It happens in the ankles and hips and knees. She had   Initial visit: Catherine Hancock is a 54 y.o. female here as a referral from Dr. Shon Millet for migraines. She is a former patient of Dr. Erling Cruz. She has a thousand crickets in her head and a loud pitched noise in her head. The noises wake her up. It is all over her head. She has been very dizzy for 3 weeks. She has sharp pains in the right side of her head. The sound is on the top of her  head on the right. In the last year it has been worse. Her vision is worsening, she is buying magnifying glasses for everything, more blurry, no double vision, Sharp pain. Also in the back of the head. He gave her flexeril tid, that helps with the headaches. Imitrex doesn't help, her face feels on fire when she takes it. She feels like someone poured gasoline on her head. She was also given hydrocodone for acute management. She has the headaches daily, worse with changes in weather. + light sensitivity, + photophobia, + nausea. They last for several hours, 7-8/10. Headaches are worsening.  Review of Systems: Patient complains of symptoms per HPI as well as the following symptoms: Requip and see of urination, urgency, joint pain, joint swelling, back pain, aching muscles, walking difficulty, neck pain, neck stiffness Pertinent negatives per HPI. All others negative.   History   Social History  . Marital Status: Married    Spouse Name: Doren Custard  . Number of Children: 3  . Years of Education: 12   Occupational History  . Not on file.   Social History Main Topics  . Smoking status: Never Smoker   . Smokeless tobacco: Not on file  . Alcohol Use: Yes     Comment: socially  .  Drug Use: No  . Sexual Activity: Not on file   Other Topics Concern  . Not on file   Social History Narrative   Live at home with husband and son.   Right handed.   Caffeine use: drinks 1 pepsi/day        Family History  Problem Relation Age of Onset  . Mitral valve prolapse Sister   . Colon cancer Maternal Aunt     x 4  . Colon cancer Maternal Uncle   . Colon cancer Maternal Grandmother   . Heart disease Maternal Grandfather   . Breast cancer Maternal Aunt   . Ovarian cancer Maternal Aunt   . Heart disease Paternal Uncle     x 4  . Heart disease Paternal Grandfather   . Prostate cancer Father   . COPD Father     Past Medical History  Diagnosis Date  . Pulmonary nodule, right   . Chest pain      LHC 7/08: EF 60%, normal coronaries; ETT-Echo 6/11: EF 55-60%, no ischemia  . Asthma   . IBS (irritable bowel syndrome)   . CTS (carpal tunnel syndrome)     bil  . Internal hemorrhoids   . Migraines   . Hypothyroidism   . Chronic back pain   . Asthma     Past Surgical History  Procedure Laterality Date  . Total vaginal hysterectomy    . Nasal sinus surgery      1992  . Tonsillectomy      1981  . Breast nodule surg      right breast nodule and left lymph node removed in 1994  . Lymph node biopsy      left breast  . Hemorrhoid surgery      2007  . Colonoscopy  06/09/2012    Procedure: COLONOSCOPY;  Surgeon: Lafayette Dragon, MD;  Location: [facility];  Service: Endoscopy;  Laterality: N/A;    Current Outpatient Prescriptions  Medication Sig Dispense Refill  . ALPRAZolam (XANAX) 0.5 MG tablet PRN    . Azelastine HCl 0.15 % SOLN Place 1 Squirt into the nose as needed.    . budesonide-formoterol (SYMBICORT) 80-4.5 MCG/ACT inhaler Inhale 1 puff into the lungs 2 (two) times daily as needed.     . cyclobenzaprine (FLEXERIL) 10 MG tablet Take 1 tablet (10 mg total) by mouth 3 (three) times daily as needed for muscle spasms. 270 tablet 3  . DULoxetine (CYMBALTA) 20 MG capsule Take 20 mg by mouth at bedtime.    Marland Kitchen eletriptan (RELPAX) 40 MG tablet Take 1 tablet (40 mg total) by mouth as needed for migraine or headache. One tablet by mouth at onset of headache. May repeat in 2 hours if headache persists or recurs. 12 tablet 0  . HYDROcodone-acetaminophen (NORCO/VICODIN) 5-325 MG per tablet Take 1 tablet by mouth as needed.    . lansoprazole (PREVACID) 30 MG capsule Take 30 mg by mouth as needed.     . montelukast (SINGULAIR) 10 MG tablet Take 10 mg by mouth at bedtime.    . polyethylene glycol powder (GLYCOLAX/MIRALAX) powder Take 17 g by mouth as needed.    . Prenat-FeFum-Doc-FA-DHA w/o A (NEXA PLUS) 29-1.25-350 MG CAPS Take 1 tablet by mouth Daily.    Marland Kitchen PROAIR HFA 108 (90 BASE) MCG/ACT  inhaler Take 2 puffs by mouth 4 (four) times daily. PRN    . SYNTHROID 75 MCG tablet Take 75 mcg by mouth daily.    Marland Kitchen topiramate (TOPAMAX) 100  MG tablet Take 1 tablet (100 mg total) by mouth 2 (two) times daily. (Patient taking differently: Take 50 mg by mouth 2 (two) times daily. 1 in the morning and 2 mg at night (50mg  tablet)) 180 tablet 3  . zolpidem (AMBIEN) 10 MG tablet Take 1 tablet by mouth as needed.    . hyoscyamine (LEVSIN/SL) 0.125 MG SL tablet Place 1 tablet (0.125 mg total) under the tongue every 4 (four) hours as needed for cramping. 30 tablet 2  . nitroGLYCERIN (NITROSTAT) 0.4 MG SL tablet Place 1 tablet (0.4 mg total) under the tongue every 5 (five) minutes as needed for chest pain. 25 tablet 12   No current facility-administered medications for this visit.    Allergies as of 03/12/2015 - Review Complete 01/13/2015  Allergen Reaction Noted  . Contrast media [iodinated diagnostic agents]  02/12/2015  . Fluticasone-salmeterol Other (See Comments)   . Sulfamethoxazole-trimethoprim      Vitals: BP 119/69 mmHg  Pulse 85  Ht 5' (1.524 m)  Wt 146 lb 8 oz (66.452 kg)  BMI 28.61 kg/m2 Last Weight:  Wt Readings from Last 1 Encounters:  03/12/15 146 lb 8 oz (66.452 kg)   Last Height:   Ht Readings from Last 1 Encounters:  03/12/15 5' (1.524 m)   Focused neurologic exam: No rashes, effusions. Full ROM at all joints. No pain on palpation of joints.       Assessment/Plan:  54 year old female who says she is hurting all over every since she had the MRI and MRA. She feels as though the inciting factor may have been that the dye was directly injected as opposed to the other ways which she has had IV contrast administrated. Neurologic exam is normal, no rashes and no effusions, full range of motion in all joints, no pain on palpation of joints.  We'll order labs including sedimentation rate, CRP, to check renal function, TSH, CK, ANA with reflex, rheumatoid factor. Since patient  is having migratory joint pain will refer her to rheumatology.  Sarina Ill, MD  [facility] [address] [place], [place] [address]  Phone [phone] Fax [phone]  A total of 30 minutes was spent face-to-face with this patient. Over half this time was spent on counseling patient on patient's joint pain and different diagnostic and therapeutic options available.

## 2015-03-12 NOTE — Patient Instructions (Addendum)
Overall you are doing fairly well but I do want to suggest a few things today:   Remember to drink plenty of fluid, eat healthy meals and do not skip any meals. Try to eat protein with a every meal and eat a healthy snack such as fruit or nuts in between meals. Try to keep a regular sleep-wake schedule and try to exercise daily, particularly in the form of walking, 20-30 minutes a day, if you can.   As far as your medications are concerned, I would like to suggest: continue current medications  As far as diagnostic testing: Labs today,xray of the right shoulder  I would like to see you back as needed, sooner if we need to. Please call us with any interim questions, concerns, problems, updates or refill requests.   Please also call us for any test results so we can go over those with you on the phone.  My clinical assistant and will answer any of your questions and relay your messages to me and also relay most of my messages to you.   Our phone number is [phone]. We also have an after hours call service for urgent matters and there is a physician on-call for urgent questions. For any emergencies you know to call 911 or go to the nearest emergency room

## 2015-03-14 ENCOUNTER — Telehealth: Payer: Self-pay | Admitting: Neurology

## 2015-03-14 ENCOUNTER — Other Ambulatory Visit: Payer: Self-pay | Admitting: Neurology

## 2015-03-14 MED ORDER — IBUPROFEN 800 MG PO TABS: 800.0000 mg | ORAL_TABLET | Freq: Three times a day (TID) | ORAL | Status: DC | PRN

## 2015-03-14 NOTE — Telephone Encounter (Signed)
Spoke to patient. ESr and CRP normal. She is taking ibuprofen multiple daily and wants a prescription. I warned against long-term daily NSAIDs due to risk of GI bleeding, she should take only as necessary and do not want her taking it every day for her safety. She is to call back if her joint pain persists or worsens. Take with food, watch for dark stools or gi upset.

## 2015-03-14 NOTE — Telephone Encounter (Signed)
Catherine Hancock - patient would like a copy of the labs performed on our office this week. Please mail them to her home thank you.

## 2015-03-17 ENCOUNTER — Encounter: Payer: Self-pay | Admitting: *Deleted

## 2015-03-17 NOTE — Telephone Encounter (Signed)
Sent letter of results from 03/12/15 on 03/17/15.

## 2015-03-21 LAB — COMPREHENSIVE METABOLIC PANEL
ALT: 9 IU/L (ref 0–32)
AST: 13 IU/L (ref 0–40)
Albumin/Globulin Ratio: 2 (ref 1.1–2.5)
Albumin: 4.2 g/dL (ref 3.5–5.5)
Alkaline Phosphatase: 92 IU/L (ref 39–117)
BUN/Creatinine Ratio: 17 (ref 9–23)
BUN: 12 mg/dL (ref 6–24)
Bilirubin Total: <0.2 mg/dL (ref 0.0–1.2)
CHLORIDE: 108 mmol/L (ref 97–108)
CO2: 22 mmol/L (ref 18–29)
Calcium: 9.3 mg/dL (ref 8.7–10.2)
Creatinine, Ser: 0.69 mg/dL (ref 0.57–1.00)
GFR calc Af Amer: 114 mL/min/{1.73_m2} (ref >59)
GFR calc non Af Amer: 99 mL/min/{1.73_m2} (ref >59)
Globulin, Total: 2.1 g/dL (ref 1.5–4.5)
Glucose: 84 mg/dL (ref 65–99)
POTASSIUM: 4.1 mmol/L (ref 3.5–5.2)
Sodium: 143 mmol/L (ref 134–144)
Total Protein: 6.3 g/dL (ref 6.0–8.5)

## 2015-03-21 LAB — ANA W/REFLEX: Anti Nuclear Antibody(ANA): NEGATIVE

## 2015-03-21 LAB — HYPERCOAGULABLE PANEL, COMPREHENSIVE
Anticardiolipin Ab, IgG: <10 [GPL'U]
Anticardiolipin Ab, IgM: <10 [MPL'U]
Beta-2 Glycoprotein I, IgG: <10 SGU
HOMOCYSTEINE: 8.5 umol/L

## 2015-03-21 LAB — TSH: TSH: 2.18 u[IU]/mL (ref 0.450–4.500)

## 2015-03-21 LAB — CK: Total CK: 73 U/L (ref 24–173)

## 2015-03-21 LAB — SEDIMENTATION RATE: SED RATE: 3 mm/h (ref 0–40)

## 2015-03-21 LAB — RHEUMATOID FACTOR: Rhuematoid fact SerPl-aCnc: 9.3 IU/mL (ref 0.0–13.9)

## 2015-03-21 LAB — C-REACTIVE PROTEIN: CRP: 3.8 mg/L (ref 0.0–4.9)

## 2016-03-10 ENCOUNTER — Other Ambulatory Visit: Payer: Self-pay | Admitting: Neurology

## 2016-06-09 ENCOUNTER — Other Ambulatory Visit: Payer: Self-pay

## 2016-06-09 DIAGNOSIS — Z1231 Encounter for screening mammogram for malignant neoplasm of breast: Secondary | ICD-10-CM

## 2017-02-04 ENCOUNTER — Ambulatory Visit: Payer: BLUE CROSS/BLUE SHIELD | Admitting: Internal Medicine

## 2017-03-18 ENCOUNTER — Ambulatory Visit (INDEPENDENT_AMBULATORY_CARE_PROVIDER_SITE_OTHER): Payer: BLUE CROSS/BLUE SHIELD | Admitting: Internal Medicine

## 2017-03-18 ENCOUNTER — Encounter: Payer: Self-pay | Admitting: Internal Medicine

## 2017-03-18 ENCOUNTER — Encounter (INDEPENDENT_AMBULATORY_CARE_PROVIDER_SITE_OTHER): Payer: Self-pay

## 2017-03-18 VITALS — BP 110/68 | HR 73 | Ht 60.0 in | Wt 161.6 lb

## 2017-03-18 DIAGNOSIS — R0683 Snoring: Secondary | ICD-10-CM | POA: Diagnosis not present

## 2017-03-18 DIAGNOSIS — R5383 Other fatigue: Secondary | ICD-10-CM

## 2017-03-18 DIAGNOSIS — R0789 Other chest pain: Secondary | ICD-10-CM | POA: Diagnosis not present

## 2017-03-18 MED ORDER — ISOSORBIDE MONONITRATE ER 30 MG PO TB24: 30.0000 mg | ORAL_TABLET | Freq: Every day | ORAL | 1 refills | Status: DC

## 2017-03-18 NOTE — Progress Notes (Signed)
New Outpatient Visit Date: 03/18/2017  Referring Provider: T[place] [address]  Chief Complaint: Chest pain  HPI:  Catherine Hancock is a 56 y.o. female who is being seen today for the evaluation of chest pain at the request of Dr. Shon Millet. She has a history of noncardiac chest pain, irritable bowel syndrome, asthma, chronic back pain, and hypothyroidism. She has been evaluated in our office several times for chest pain, most recently having seen Dr. Aundra Dubin in 2013. Previous workup has included left heart catheterization in 2008, stress echos in 2011 and 2013, and coronary CTA in 2013, all of which have been normal. In the past, treatment with PPI was not beneficial. She has noted some relief with sublingual nitroglycerin.  Over the last 1.5 years, Catherine Hancock has experienced intermittent chest pain that "comes and goes." She describes a sharp, stabbing sensation under the sternum and left breast without radiation. This is sometimes accompanied by shortness of breath. The episodes can last a few minutes to several hours. Her most recent episode occurred yesterday. Over the last 2-3 months, the episodes have become more frequent and are now happening almost once a week. The symptoms are typically not exertional or related to any other activity/position. The pain is usually 6/10 in intensity. She also notes constant left upper quadrant and lower chest pain radiating to the flank. This has been present for many years. Superimposed on this, Catherine Hancock notes that her asthma flares have been more frequent over the last 1-2 years. She has been on several courses of prednisone. She also contracted the flu this spring, from which she is still recovering. She denies palpitations, lightheadedness, orthopnea, PND, and edema.  --------------------------------------------------------------------------------------------------  Cardiovascular History & Procedures: Cardiovascular  Problems:  Chest pain  Risk Factors:  Obesity and sedentary lifestyle  Cath/PCI:  LHC (02/01/07): LMCA normal. LAD with 2 large diagonal branches, normal. Dominant LCx without significant disease. Nondominant RCA without significant disease. LVEF 60% without Hancock motion abnormalities.  CV Surgery:  None  EP Procedures and Devices:  None  Non-Invasive Evaluation(s):  Cardiac CTA (02/23/12): Coronary calcium score 0. Left dominant. Normal coronary arteries.  Exercise stress echo (01/14/12): Normal resting and stress echo images. Positive EKG changes and recovery with ST depression. Overall low risk study.  TTE (01/10/12): Normal LV size and function (EF 60-65%) with normal diastolic function. Normal RV size and function. No significant valvular abnormalities.  Exercise stress echo (04/13/10): Normal EKG and stress echocardiogram without evidence of ischemia.  TTE (04/13/10): Normal LV size and thickness (EF 55-60%). Normal diastolic function. No significant valvular abnormalities. Normal RV size and function.  Recent CV Pertinent Labs: Lab Results  Component Value Date   K 4.1 03/12/2015   BUN 12 03/12/2015   CREATININE 0.69 03/12/2015    --------------------------------------------------------------------------------------------------  Past Medical History:  Diagnosis Date  . Asthma   . Asthma   . Chest pain    LHC 7/08: EF 60%, normal coronaries; ETT-Echo 6/11: EF 55-60%, no ischemia  . Chronic back pain   . CTS (carpal tunnel syndrome)    bil  . Hypothyroidism   . IBS (irritable bowel syndrome)   . Internal hemorrhoids   . Migraines   . Pulmonary nodule, right     Past Surgical History:  Procedure Laterality Date  . breast nodule surg     right breast nodule and left lymph node removed in 1994  . COLONOSCOPY  06/09/2012   Procedure: COLONOSCOPY;  Surgeon: Lafayette Dragon, MD;  Location: [facility];  Service: Endoscopy;  Laterality: N/A;  . HEMORRHOID SURGERY       2007  . LYMPH NODE BIOPSY     left breast  . NASAL SINUS SURGERY     1992  . TONSILLECTOMY     1981  . TOTAL VAGINAL HYSTERECTOMY      Outpatient Encounter Prescriptions as of 03/18/2017  Medication Sig  . ALPRAZolam (XANAX) 0.5 MG tablet Take 0.25 mg by mouth as directed. PRN  . Azelastine HCl 0.15 % SOLN Place 1 Squirt into the nose as directed.   . budesonide-formoterol (SYMBICORT) 80-4.5 MCG/ACT inhaler Inhale 1 puff into the lungs 2 (two) times daily as needed.   . cyclobenzaprine (FLEXERIL) 10 MG tablet TAKE 1 TABLET THREE TIMES A DAY AS NEEDED FOR MUSCLE SPASMS  . DONNATAL 16.2 MG tablet Take 16.2 mg by mouth daily as needed. Stomach pain  . DULoxetine (CYMBALTA) 20 MG capsule Take 20 mg by mouth at bedtime.  Marland Kitchen esomeprazole (NEXIUM) 40 MG capsule Take 40 mg by mouth 2 (two) times daily.  Marland Kitchen HYDROcodone-acetaminophen (NORCO/VICODIN) 5-325 MG per tablet Take 1 tablet by mouth daily as needed (pain).   Marland Kitchen ibuprofen (ADVIL,MOTRIN) 800 MG tablet Take 800 mg by mouth every 8 (eight) hours as needed (pain).  . montelukast (SINGULAIR) 10 MG tablet Take 10 mg by mouth at bedtime.  . polyethylene glycol powder (GLYCOLAX/MIRALAX) powder Take 17 g by mouth as needed for mild constipation.   . Prenat-FeFum-Doc-FA-DHA w/o A (NEXA PLUS) 29-1.25-350 MG CAPS Take 1 tablet by mouth Daily.  Marland Kitchen PROAIR HFA 108 (90 BASE) MCG/ACT inhaler Take 2 puffs by mouth 4 (four) times daily. PRN  . SYNTHROID 75 MCG tablet Take 75 mcg by mouth daily.  Marland Kitchen topiramate (TOPAMAX) 50 MG tablet Take 50 mg by mouth every morning. Take 100mg  by mouth at night  . zolpidem (AMBIEN) 10 MG tablet Take 0.5 tablets by mouth at bedtime as needed for sleep.   . hyoscyamine (LEVSIN/SL) 0.125 MG SL tablet Place 1 tablet (0.125 mg total) under the tongue every 4 (four) hours as needed for cramping.  . nitroGLYCERIN (NITROSTAT) 0.4 MG SL tablet Place 1 tablet (0.4 mg total) under the tongue every 5 (five) minutes as needed for chest  pain.  . [DISCONTINUED] eletriptan (RELPAX) 40 MG tablet Take 1 tablet (40 mg total) by mouth as needed for migraine or headache. One tablet by mouth at onset of headache. May repeat in 2 hours if headache persists or recurs.  . [DISCONTINUED] ibuprofen (ADVIL,MOTRIN) 800 MG tablet Take 1 tablet (800 mg total) by mouth every 8 (eight) hours as needed.  . [DISCONTINUED] lansoprazole (PREVACID) 30 MG capsule Take 30 mg by mouth as needed.   . [DISCONTINUED] topiramate (TOPAMAX) 100 MG tablet Take 1 tablet (100 mg total) by mouth 2 (two) times daily. (Patient taking differently: Take 50 mg by mouth 2 (two) times daily. 1 in the morning and 2 mg at night (50mg  tablet))   No facility-administered encounter medications on file as of 03/18/2017.     Allergies: Contrast media [iodinated diagnostic agents]; Fluticasone-salmeterol; Latex; and Sulfamethoxazole-trimethoprim  Social History   Social History  . Marital status: Married    Spouse name: Doren Custard  . Number of children: 3  . Years of education: 12   Occupational History  . Not on file.   Social History Main Topics  . Smoking status: Never Smoker  . Smokeless tobacco: Never Used  .  Alcohol use No  . Drug use: No  . Sexual activity: Not on file   Other Topics Concern  . Not on file   Social History Narrative   Live at home with husband and son.   Right handed.   Caffeine use: drinks 1 pepsi/day        Family History  Problem Relation Age of Onset  . Colon cancer Maternal Aunt        x 4  . Colon cancer Maternal Uncle   . Colon cancer Maternal Grandmother   . Heart disease Maternal Grandfather   . Breast cancer Maternal Aunt   . Ovarian cancer Maternal Aunt   . Lymphoma Mother   . Prostate cancer Father   . COPD Father   . Heart failure Father   . Hypertension Father   . Heart disease Paternal Uncle        x 4  . Heart disease Paternal Grandfather   . Mitral valve prolapse Sister     Review of Systems: Review of  Systems  Constitutional: Positive for malaise/fatigue.  HENT: Negative.   Eyes: Negative.   Respiratory: Positive for cough. Negative for wheezing (Snores frequently at night).   Cardiovascular: Positive for chest pain. Negative for claudication (intermittent leg pain but not exertional).  Gastrointestinal: Positive for abdominal pain.  Genitourinary: Negative.   Musculoskeletal: Positive for back pain.  Skin: Negative.   Neurological: Positive for dizziness and headaches.  Endo/Heme/Allergies: Bruises/bleeds easily.  Psychiatric/Behavioral: Negative.    --------------------------------------------------------------------------------------------------  Physical Exam: BP 110/68   Pulse 73   Ht 5' (1.524 m)   Wt 161 lb 9.6 oz (73.3 kg)   BMI 31.56 kg/m   General:  Obese woman, seated comfortably in the exam room. HEENT: No conjunctival pallor or scleral icterus.  Moist mucous membranes.  OP clear. Neck: Supple without lymphadenopathy, thyromegaly, JVD, or HJR.  No carotid bruit. Lungs: Normal work of breathing.  Clear to auscultation bilaterally without wheezes or crackles. Heart: Regular rate and rhythm without murmurs, rubs, or gallops.  Non-displaced PMI. Abd: Bowel sounds present.  Mild diffuse abdominal tenderness without rebound or guarding. Nondistended. No hepatosplenomegaly. Ext: No lower extremity edema.  Radial, PT, and DP pulses are 2+ bilaterally Skin: warm and dry without rash Neuro: CNIII-XII intact.  Strength and fine-touch sensation intact in upper and lower extremities bilaterally. Psych: Normal mood and affect.  EKG:  Normal sinus rhythm without significant abnormalities.  No results found for: WBC, HGB, HCT, MCV, PLT  Lab Results  Component Value Date   NA 143 03/12/2015   K 4.1 03/12/2015   CL 108 03/12/2015   CO2 22 03/12/2015   BUN 12 03/12/2015   CREATININE 0.69 03/12/2015   GLUCOSE 84 03/12/2015   ALT 9 03/12/2015    No results found for:  CHOL, HDL, LDLCALC, LDLDIRECT, TRIG, CHOLHDL  --------------------------------------------------------------------------------------------------  ASSESSMENT AND PLAN: Chest pain Symptoms are atypical, given sharp nature that is not related to exertion. Catherine Hancock has undergone extensive prior cardiac workup including coronary angiography, cardiac CTA, and exercise stress echocardiogram 2. No CAD was noted on any of these tests. However, most recent ischemia evaluation was approximately 5 years ago. Given recent recurrence of pain, we have agreed to perform an exercise stress echocardiogram. Pending evaluation, I have asked the patient to start taking aspirin 81 mg daily. We will also initiate isosorbide mononitrate 30 mg daily for potential component of coronary vasospasm and/or microvascular dysfunction.  Fatigue and snoring These have been  chronic problems for the patient. Given her husband's report of snoring and brief apneic episodes combined with Catherine Hancock's obesity, I am suspicious for sleep apnea. We will refer her for a sleep study.  Follow-up: Return to clinic in 6-8 weeks.  Nelva Bush, MD 03/19/2017 12:12 PM

## 2017-03-18 NOTE — Patient Instructions (Signed)
Medication Instructions:  Start aspirin 81 mg daily.  Start Imdur (isosorbide mononitrate) 30 mg daily  Labwork: None   Testing/Procedures: Schedule an appointment for a stress echocardiogram.  Your physician has recommended that you have a sleep study. This test records several body functions during sleep, including: brain activity, eye movement, oxygen and carbon dioxide blood levels, heart rate and rhythm, breathing rate and rhythm, the flow of air through your mouth and nose, snoring, body muscle movements, and chest and belly movement.    Follow-Up: Your physician recommends that you schedule a follow-up appointment in: 6-8 weeks.         If you need a refill on your cardiac medications before your next appointment, please call your pharmacy.

## 2017-03-21 ENCOUNTER — Telehealth: Payer: Self-pay | Admitting: *Deleted

## 2017-03-21 NOTE — Telephone Encounter (Addendum)
ESS= 17 Informed patient of upcoming sleep study and patient understanding was verbalized. Patient understands her sleep study is scheduled for June 02 2017. Patient understands her sleep study will be done at [facility] sleep lab. Patient understands she will receive a sleep packet in a week or so. Patient understands to call if she does not receive the sleep packet in a timely manner. Patient agrees with treatment and thanked me for call

## 2017-03-31 ENCOUNTER — Telehealth (HOSPITAL_COMMUNITY): Payer: Self-pay | Admitting: *Deleted

## 2017-03-31 NOTE — Telephone Encounter (Signed)
Patient given detailed instructions per Stress Test Requisition Sheet for test on 04/04/17 at 7:30.Patient Notified to arrive 30 minutes early, and that it is imperative to arrive on time for appointment to keep from having the test rescheduled.  Patient verbalized understanding. Catherine Hancock

## 2017-04-04 ENCOUNTER — Ambulatory Visit (HOSPITAL_COMMUNITY): Payer: BLUE CROSS/BLUE SHIELD

## 2017-04-04 ENCOUNTER — Ambulatory Visit (HOSPITAL_COMMUNITY): Payer: BLUE CROSS/BLUE SHIELD | Attending: Cardiovascular Disease

## 2017-04-04 DIAGNOSIS — R0789 Other chest pain: Secondary | ICD-10-CM | POA: Diagnosis present

## 2017-04-07 ENCOUNTER — Telehealth: Payer: Self-pay | Admitting: *Deleted

## 2017-04-07 DIAGNOSIS — R0789 Other chest pain: Secondary | ICD-10-CM

## 2017-04-07 NOTE — Telephone Encounter (Signed)
Notes recorded by Katrine Coho, RN on 04/07/2017 at 3:02 PM EDT Discussed with pt, pt states her chest pain in unchanged, she has not started Imdur yet, she was waiting for the results of this test. I reviewed with Dr End-per Dr End get lexiscan myoview, start Imdur 30 mg daily. ------  Notes recorded by Nelva Bush, MD on 04/06/2017 at 8:09 AM EDT Please let Ms. Mapel know that her exercise stress echo did not show any evidence of a blockage, though she was unable to reach her target heart rate. This limits the sensitivity of the test somewhat. If her chest pain has improved with the medical therapy that we added at her last visit, we will f/u as planned next month. However, if the chest pain has not improved or has worsened, we will need to consider pharmacologic stress test, given suboptimal heart rate response with exercise. Thanks.  I discussed Dr Darnelle Bos recommendations to get lexiscan myoview and start Imdur 30 mg daily with patient. Pt verbalized understanding--instructions for lexiscan myoview reviewed with patient over the telephone.

## 2017-04-12 ENCOUNTER — Telehealth (HOSPITAL_COMMUNITY): Payer: Self-pay | Admitting: *Deleted

## 2017-04-12 NOTE — Telephone Encounter (Signed)
Left message on voicemail per DPR in reference to upcoming appointment scheduled on 04/15/17 at 0815 with detailed instructions given per Myocardial Perfusion Study Information Sheet for the test. LM to arrive 15 minutes early, and that it is imperative to arrive on time for appointment to keep from having the test rescheduled. If you need to cancel or reschedule your appointment, please call the office within 24 hours of your appointment. Failure to do so may result in a cancellation of your appointment, and a $50 no show fee. Phone number given for call back for any questions.

## 2017-04-15 ENCOUNTER — Ambulatory Visit (HOSPITAL_COMMUNITY): Payer: BLUE CROSS/BLUE SHIELD | Attending: Internal Medicine

## 2017-04-15 DIAGNOSIS — R0789 Other chest pain: Secondary | ICD-10-CM

## 2017-04-15 LAB — MYOCARDIAL PERFUSION IMAGING
CHL CUP NUCLEAR SDS: 3
CHL CUP RESTING HR STRESS: 64 {beats}/min
LHR: 0.25
LV sys vol: 20 mL
LVDIAVOL: 70 mL (ref 46–106)
NUC STRESS TID: 0.93
Peak HR: 94 {beats}/min
SRS: 0
SSS: 3

## 2017-04-15 MED ORDER — TECHNETIUM TC 99M TETROFOSMIN IV KIT
31.7000 | PACK | Freq: Once | INTRAVENOUS | Status: AC | PRN
  Administered 2017-04-15: 31.7 via INTRAVENOUS
  Filled 2017-04-15: qty 32

## 2017-04-15 MED ORDER — TECHNETIUM TC 99M TETROFOSMIN IV KIT
9.8000 | PACK | Freq: Once | INTRAVENOUS | Status: AC | PRN
  Administered 2017-04-15: 9.8 via INTRAVENOUS
  Filled 2017-04-15: qty 10

## 2017-04-15 MED ORDER — REGADENOSON 0.4 MG/5ML IV SOLN
0.4000 mg | Freq: Once | INTRAVENOUS | Status: AC
  Administered 2017-04-15: 0.4 mg via INTRAVENOUS

## 2017-05-06 ENCOUNTER — Ambulatory Visit (INDEPENDENT_AMBULATORY_CARE_PROVIDER_SITE_OTHER): Payer: BLUE CROSS/BLUE SHIELD | Admitting: Internal Medicine

## 2017-05-06 ENCOUNTER — Encounter: Payer: Self-pay | Admitting: Internal Medicine

## 2017-05-06 VITALS — BP 118/70 | HR 74 | Ht 60.0 in | Wt 162.0 lb

## 2017-05-06 DIAGNOSIS — R0789 Other chest pain: Secondary | ICD-10-CM | POA: Diagnosis not present

## 2017-05-06 DIAGNOSIS — M79605 Pain in left leg: Secondary | ICD-10-CM | POA: Diagnosis not present

## 2017-05-06 DIAGNOSIS — M79604 Pain in right leg: Secondary | ICD-10-CM | POA: Diagnosis not present

## 2017-05-06 NOTE — Patient Instructions (Signed)
Medication Instructions:  Your physician recommends that you continue on your current medications as directed. Please refer to the Current Medication list given to you today.  Labwork: No new orders.   Testing/Procedures: No new orders.   Follow-Up: Your physician wants you to follow-up in: 6 MONTHS with Dr End.  You will receive a reminder letter in the mail two months in advance. If you don't receive a letter, please call our office to schedule the follow-up appointment.   Any Other Special Instructions Will Be Listed Below (If Applicable).     If you need a refill on your cardiac medications before your next appointment, please call your pharmacy.

## 2017-05-06 NOTE — Progress Notes (Signed)
Follow-up Outpatient Visit Date: 05/06/2017  Primary Care Provider: Tobe Sos, [place] [address]  Chief Complaint: Follow-up chest pain  HPI:  Catherine Hancock is a 56 y.o. year-old female with history of noncardiac chest pain, irritable bowel syndrome, asthma, chronic back pain, and hypothyroidism, who presents for follow-up of chest pain. I last saw her on 03/18/17 for assessment of recurrent chest pain. We agreed to perform an exercise stress echocardiogram, which was inconclusive due to failure to achieve target heart rate. However, at workload achieved, there were no wall motion abnormalities. We subsequently obtained a pharmacologic myocardial perfusion stress test, which was normal. Today, Catherine Hancock reports that her chest pain is unchanged from our prior visit. She has intermittent tightening and "pain" across the precordium, which comes on randomly and can last up to 3 hours. She has not tried using isosorbide mononitrate or sublingual NTG, as we discussed at her last visit. She remains on a PPI but will need further esophageal dilation next month. She denies shortness of breath and edema.  -------------------------------------------------------------------------------------------------- Cardiovascular History & Procedures: Cardiovascular Problems:  Chest pain  Risk Factors:  Obesity and sedentary lifestyle  Cath/PCI:  LHC (02/01/07): LMCA normal. LAD with 2 large diagonal branches, normal. Dominant LCx without significant disease. Nondominant RCA without significant disease. LVEF 60% without wall motion abnormalities.  CV Surgery:  None  EP Procedures and Devices:  None  Non-Invasive Evaluation(s):  Pharmacologic MPI (04/15/17): Normal study without evidence of ischemia or scar. LVEF 71%.  Exercise stress echo (04/04/17): Submaximal workload achieving 81% MPHR. Normal LVEF at baseline with hyperdynamic response to stress. No EKG changes at  submaximal workload.  Cardiac CTA (02/23/12): Coronary calcium score 0. Left dominant. Normal coronary arteries.  Exercise stress echo (01/14/12): Normal resting and stress echo images. Positive EKG changes and recovery with ST depression. Overall low risk study.  TTE (01/10/12): Normal LV size and function (EF 60-65%) with normal diastolic function. Normal RV size and function. No significant valvular abnormalities.  Exercise stress echo (04/13/10): Normal EKG and stress echocardiogram without evidence of ischemia.  TTE (04/13/10): Normal LV size and thickness (EF 55-60%). Normal diastolic function. No significant valvular abnormalities. Normal RV size and function.  Recent CV Pertinent Labs: Lab Results  Component Value Date   K 4.1 03/12/2015   BUN 12 03/12/2015   CREATININE 0.69 03/12/2015    Past medical and surgical history were reviewed and updated in EPIC.  Current Meds  Medication Sig  . ALPRAZolam (XANAX) 0.5 MG tablet Take 0.25 mg by mouth as directed. PRN  . aspirin EC 81 MG tablet Take 1 tablet (81 mg total) by mouth daily.  . budesonide-formoterol (SYMBICORT) 80-4.5 MCG/ACT inhaler Inhale 1 puff into the lungs 2 (two) times daily as needed.   . cyclobenzaprine (FLEXERIL) 10 MG tablet TAKE 1 TABLET THREE TIMES A DAY AS NEEDED FOR MUSCLE SPASMS  . DONNATAL 16.2 MG tablet Take 16.2 mg by mouth daily as needed. Stomach pain  . DULoxetine (CYMBALTA) 20 MG capsule Take 20 mg by mouth at bedtime.  Marland Kitchen esomeprazole (NEXIUM) 40 MG capsule Take 40 mg by mouth 2 (two) times daily.  Marland Kitchen HYDROcodone-acetaminophen (NORCO/VICODIN) 5-325 MG per tablet Take 1 tablet by mouth daily as needed (pain).   . hyoscyamine (LEVSIN/SL) 0.125 MG SL tablet Place 1 tablet (0.125 mg total) under the tongue every 4 (four) hours as needed for cramping.  Marland Kitchen ibuprofen (ADVIL,MOTRIN) 800 MG tablet Take 800 mg by mouth every  8 (eight) hours as needed (pain).  . montelukast (SINGULAIR) 10 MG tablet Take 10 mg by  mouth at bedtime.  . nitroGLYCERIN (NITROSTAT) 0.4 MG SL tablet Place 1 tablet (0.4 mg total) under the tongue every 5 (five) minutes as needed for chest pain.  . polyethylene glycol powder (GLYCOLAX/MIRALAX) powder Take 17 g by mouth as needed for mild constipation.   . Prenat-FeFum-Doc-FA-DHA w/o A (NEXA PLUS) 29-1.25-350 MG CAPS Take 1 tablet by mouth Daily.  Marland Kitchen PROAIR HFA 108 (90 BASE) MCG/ACT inhaler Take 2 puffs by mouth 4 (four) times daily. PRN  . SYNTHROID 75 MCG tablet Take 75 mcg by mouth daily.  Marland Kitchen topiramate (TOPAMAX) 50 MG tablet Take 50 mg by mouth every morning. Take 100mg  by mouth at night  . zolpidem (AMBIEN) 10 MG tablet Take 0.5 tablets by mouth at bedtime as needed for sleep.     Allergies: Contrast media [iodinated diagnostic agents]; Fluticasone-salmeterol; Latex; and Sulfamethoxazole-trimethoprim  Social History   Social History  . Marital status: Married    Spouse name: Doren Custard  . Number of children: 3  . Years of education: 12   Occupational History  . Not on file.   Social History Main Topics  . Smoking status: Never Smoker  . Smokeless tobacco: Never Used  . Alcohol use No  . Drug use: No  . Sexual activity: Not on file   Other Topics Concern  . Not on file   Social History Narrative   Live at home with husband and son.   Right handed.   Caffeine use: drinks 1 pepsi/day        Family History  Problem Relation Age of Onset  . Colon cancer Maternal Aunt        x 4  . Colon cancer Maternal Uncle   . Colon cancer Maternal Grandmother   . Heart disease Maternal Grandfather   . Breast cancer Maternal Aunt   . Ovarian cancer Maternal Aunt   . Lymphoma Mother   . Prostate cancer Father   . COPD Father   . Heart failure Father   . Hypertension Father   . Heart disease Paternal Uncle        x 4  . Heart disease Paternal Grandfather   . Mitral valve prolapse Sister     Review of Systems: Patient reports pain and cramping in both legs,  especially at night. Otherwise, a 12-system review of systems was performed and was negative except as noted in the HPI.  --------------------------------------------------------------------------------------------------  Physical Exam: BP 118/70   Pulse 74   Ht 5' (1.524 m)   Wt 162 lb (73.5 kg)   SpO2 99%   BMI 31.64 kg/m   General:  Obese woman, seated comfortably in the exam room. She is accompanied by her husband. HEENT: No conjunctival pallor or scleral icterus. Moist mucous membranes.  OP clear. Neck: Supple without lymphadenopathy, thyromegaly, JVD, or HJR. No carotid bruit. Lungs: Normal work of breathing. Clear to auscultation bilaterally without wheezes or crackles. Heart: Regular rate and rhythm without murmurs, rubs, or gallops. Non-displaced PMI. Abd: Bowel sounds present. Soft with RUQ tenderness without guarding. Non-distended without hepatosplenomegaly. Ext: No lower extremity edema. Radial, PT, and DP pulses are 2+ bilaterally. Skin: Warm and dry without rash.  No results found for: WBC, HGB, HCT, MCV, PLT  Lab Results  Component Value Date   NA 143 03/12/2015   K 4.1 03/12/2015   CL 108 03/12/2015   CO2 22 03/12/2015  BUN 12 03/12/2015   CREATININE 0.69 03/12/2015   GLUCOSE 84 03/12/2015   ALT 9 03/12/2015    No results found for: CHOL, HDL, LDLCALC, LDLDIRECT, TRIG, CHOLHDL  --------------------------------------------------------------------------------------------------  ASSESSMENT AND PLAN: Atypical chest pain Pain is unchanged since our last visit. ESE with submaximal exercise and pharmacologic MPI were both without evidence of ischemia or scar. LHC in 2008 and coronary CTA in 2013 were also normal. I doubt that her chest pain is due to significant epicardial CAD. I have recommended that she try isosorbide mononitrate in case there is an element of coronary vasospasm and/or microvascular dysfunction contributing to her symptoms. She could begin with  15 mg daily, if she is concerned about potential side effects. I also advised her to speak with her PCP and/or gastroenterologist about potential for esophageal spasm and gall bladder disease, given RUQ tenderness on exam today. No further cardiac testing at this time; sleep study is still pending, as discussed at our prior visit.  Leg pain Symptoms are not typical for claudication. Patient has 2+ pedal pulses bilaterally. I have a low suspicion for obstructive PAD.  Follow-up: Return to clinic in 6 months.  Nelva Bush, MD 05/06/2017 8:26 AM

## 2017-05-22 ENCOUNTER — Encounter (HOSPITAL_BASED_OUTPATIENT_CLINIC_OR_DEPARTMENT_OTHER): Payer: BLUE CROSS/BLUE SHIELD

## 2017-06-02 ENCOUNTER — Ambulatory Visit (HOSPITAL_BASED_OUTPATIENT_CLINIC_OR_DEPARTMENT_OTHER): Payer: BLUE CROSS/BLUE SHIELD | Attending: Internal Medicine | Admitting: Cardiology

## 2017-06-02 VITALS — Ht 60.0 in | Wt 160.0 lb

## 2017-06-02 DIAGNOSIS — G4733 Obstructive sleep apnea (adult) (pediatric): Secondary | ICD-10-CM

## 2017-06-02 DIAGNOSIS — R0683 Snoring: Secondary | ICD-10-CM

## 2017-06-03 ENCOUNTER — Telehealth: Payer: Self-pay | Admitting: *Deleted

## 2017-06-03 NOTE — Telephone Encounter (Addendum)
Informed patient of sleep study results and patient understanding was verbalized. Patient understands Dr Radford Pax has ordered her a cpap in epic. Patient understands she will be contacted by [facility] to set up her cpap. She understands to call if [facility] does not contact her with new setup in a timely manner. She understands she will be called once confirmation has been received from [facility] that she has received her new machine to schedule 10 week follow up appointment.  Patient has chosen [facility] for her DME  [facility] notified and new cpap order faxed Please add to airview She was grateful for the call and thanked me

## 2017-06-03 NOTE — Telephone Encounter (Signed)
-----   Message from Sueanne Margarita, MD sent at 06/03/2017  2:23 PM EDT ----- Please let patient know that they have significant sleep apnea and had successful CPAP titration and will be set up with CPAP unit.  Please let DME know that order is in EPIC.  Please set patient up for OV in 10 weeks

## 2017-06-03 NOTE — Procedures (Signed)
Patient Name: Catherine Hancock, Speak Date: 06/02/2017   Gender: Female  D.O.B: Dec 25, 1960  Age (years): 11  Referring Provider: Nelva Bush MD  Height (inches): 60  Interpreting Physician: Fransico Him MD, ABSM  Weight (lbs): 160  RPSGT: Jorge Ny   BMI: 31  MRN: [id number]  Neck Size: 15.00     CLINICAL INFORMATION  Sleep Study Type: Split Night CPAP  Indication for sleep study: Snoring  Epworth Sleepiness Score: 13   SLEEP STUDY TECHNIQUE  As per the AASM Manual for the Scoring of Sleep and Associated Events v2.3 (April 2016) with a hypopnea requiring 4% desaturations.  The channels recorded and monitored were frontal, central and occipital EEG, electrooculogram (EOG), submentalis EMG (chin), nasal and oral airflow, thoracic and abdominal wall motion, anterior tibialis EMG, snore microphone, electrocardiogram, and pulse oximetry. Continuous positive airway pressure (CPAP) was initiated when the patient met split night criteria and was titrated according to treat sleep-disordered breathing.  MEDICATIONS  Medications self-administered by patient taken the night of the study : FLEXERIL, ASPIRIN, TOPAMAX, SINGULAIR, NEXIUM, XANAX, HYLAND'S RESTFUL LEGS   RESPIRATORY PARAMETERS  Diagnostic Total AHI (/hr):  13.1 RDI (/hr): 21.3 OA Index (/hr):  4.1 CA Index (/hr):  0.9  REM AHI (/hr):  N/A NREM AHI (/hr):  13.1 Supine AHI (/hr):  22.4 Non-supine AHI (/hr):  5.07  Min O2 Sat (%): 90.00 Mean O2 (%): 96.08 Time below 88% (min): 0.0    Titration Optimal Pressure (cm): 13 AHI at Optimal Pressure (/hr): 1.2 Min O2 at Optimal Pressure (%): 92.0  Supine % at Optimal (%): 100 Sleep % at Optimal (%): 93     SLEEP ARCHITECTURE  The recording time for the entire night was 385.4 minutes. During a baseline period of 178.2 minutes, the patient slept for 132.5 minutes in REM and nonREM, yielding a sleep efficiency of 74.4%. Sleep onset after lights out was 21.5 minutes with a  REM latency of N/A minutes. The patient spent 4.15% of the night in stage N1 sleep, 95.85% in stage N2 sleep, 0.00% in stage N3 and 0.00% in REM.  During the titration period of 194.4 minutes, the patient slept for 168.5 minutes in REM and nonREM, yielding a sleep efficiency of 86.7%. Sleep onset after CPAP initiation was 16.0 minutes with a REM latency of 95.0 minutes. The patient spent 2.67% of the night in stage N1 sleep, 57.57% in stage N2 sleep, 0.00% in stage N3 and 39.76% in REM.  CARDIAC DATA  The 2 lead EKG demonstrated sinus rhythm. The mean heart rate was 67.55 beats per minute. Other EKG findings include: None.   LEG MOVEMENT DATA  The total Periodic Limb Movements of Sleep (PLMS) were 148. The PLMS index was 29.40 .  IMPRESSIONS  - Mild obstructive sleep apnea occurred during the diagnostic portion of the study (AHI = 13.1 /hour). An optimal PAP pressure was selected for this patient ( 13 cm of water)  - No significant central sleep apnea occurred during the diagnostic portion of the study (CAI = 0.9/hour).  - The patient had minimal or no oxygen desaturation during the diagnostic portion of the study (Min O2 = 90.00%)  - The patient snored with Soft snoring volume during the diagnostic portion of the study.  - No cardiac abnormalities were noted during this study.  - Moderate periodic limb movements of sleep occurred during the study.  DIAGNOSIS  - Obstructive Sleep Apnea (327.23 [G47.33 ICD-10])  RECOMMENDATIONS  - Trial of  CPAP therapy on 13 cm H2O with a Small size Resmed Full Face Mask AirFit F20 mask and heated humidification.  - Avoid alcohol, sedatives and other CNS depressants that may worsen sleep apnea and disrupt normal sleep architecture.  - Sleep hygiene should be reviewed to assess factors that may improve sleep quality.  - Weight management and regular exercise should be initiated or continued.  - Return to Sleep Center for re-evaluation after 10 weeks of  therapy  [Electronically signed] 06/03/2017 02:18 PM Fransico Him MD, [place], American Board of Sleep Medicine  NPI: [id number]

## 2017-06-06 NOTE — Telephone Encounter (Signed)
Pt calling regarding needing a machine, she wants to pick up a mask and wanted the sleep nurse to call her first-pls call on cell

## 2017-06-07 NOTE — Telephone Encounter (Signed)
Sent all paperwork required to [facility] for patient to receive her cpap. Patient agrees with treatment and thanked me for the call.

## 2017-06-09 ENCOUNTER — Telehealth: Payer: Self-pay | Admitting: Cardiovascular Disease

## 2017-06-09 NOTE — Telephone Encounter (Signed)
Mrs  

## 2017-06-09 NOTE — Telephone Encounter (Signed)
New message  Juliann Pulse from the [employer] call to f/u on faxed paperwork for pt to receive a cpap. She states it was not received and asked if it could be re faxed. Please call back to discuss

## 2017-06-10 NOTE — Telephone Encounter (Signed)
All paperwork has been re- faxed to [facility]. Confirmation of fax received by Shirlee Limerick.

## 2017-06-13 NOTE — Telephone Encounter (Signed)
All paperwork has been re- faxed to [facility]. Confirmation of fax received by Shirlee Limerick.

## 2017-06-24 NOTE — Telephone Encounter (Signed)
Got set up on 06/14/17. Patient's compliance range is 9/14/1/-11/12.

## 2017-07-27 ENCOUNTER — Other Ambulatory Visit: Payer: Self-pay | Admitting: Obstetrics & Gynecology

## 2017-07-27 DIAGNOSIS — N6320 Unspecified lump in the left breast, unspecified quadrant: Secondary | ICD-10-CM

## 2017-07-29 ENCOUNTER — Ambulatory Visit
Admission: RE | Admit: 2017-07-29 | Discharge: 2017-07-29 | Disposition: A | Discharge status: Home / Self Care | Payer: BLUE CROSS/BLUE SHIELD | Source: Ambulatory Visit | Attending: Obstetrics & Gynecology | Admitting: Obstetrics & Gynecology

## 2017-07-29 DIAGNOSIS — N6320 Unspecified lump in the left breast, unspecified quadrant: Secondary | ICD-10-CM

## 2017-10-06 NOTE — Telephone Encounter (Addendum)
Patient has a 10 week follow up appointment scheduled for November 14 2017. Patient understands she needs to keep this appointment for insurance compliance. Patient was grateful for the call and thanked me.

## 2017-10-26 DIAGNOSIS — R911 Solitary pulmonary nodule: Secondary | ICD-10-CM | POA: Insufficient documentation

## 2017-10-26 DIAGNOSIS — K589 Irritable bowel syndrome without diarrhea: Secondary | ICD-10-CM | POA: Insufficient documentation

## 2017-10-26 DIAGNOSIS — G43909 Migraine, unspecified, not intractable, without status migrainosus: Secondary | ICD-10-CM | POA: Insufficient documentation

## 2017-10-26 DIAGNOSIS — G8929 Other chronic pain: Secondary | ICD-10-CM | POA: Insufficient documentation

## 2017-10-26 DIAGNOSIS — G5600 Carpal tunnel syndrome, unspecified upper limb: Secondary | ICD-10-CM | POA: Insufficient documentation

## 2017-10-26 DIAGNOSIS — K648 Other hemorrhoids: Secondary | ICD-10-CM | POA: Insufficient documentation

## 2017-10-26 DIAGNOSIS — E039 Hypothyroidism, unspecified: Secondary | ICD-10-CM | POA: Insufficient documentation

## 2017-10-26 DIAGNOSIS — M549 Dorsalgia, unspecified: Secondary | ICD-10-CM

## 2017-10-26 DIAGNOSIS — J45909 Unspecified asthma, uncomplicated: Secondary | ICD-10-CM | POA: Insufficient documentation

## 2017-11-13 ENCOUNTER — Encounter: Payer: Self-pay | Admitting: Cardiology

## 2017-11-13 DIAGNOSIS — Z9989 Dependence on other enabling machines and devices: Secondary | ICD-10-CM

## 2017-11-13 DIAGNOSIS — G4733 Obstructive sleep apnea (adult) (pediatric): Secondary | ICD-10-CM

## 2017-11-13 HISTORY — DX: Obstructive sleep apnea (adult) (pediatric): G47.33

## 2017-11-13 NOTE — Progress Notes (Deleted)
Cardiology Office Note:    Date:  11/13/2017   ID:  Catherine Hancock, DOB [birth year], MRN [id number]  PCP:  Tobe Sos, MD  Cardiologist:  No primary care provider on file.    Referring MD: Tobe Sos, MD   No chief complaint on file.   History of Present Illness:    Catherine Hancock is a 57 y.o. female with a hx of asthma who was referred by Dr. Saunders Revel for evaluation of OSA.  She underwent PSG showing mild OSA with an AHI of 13.1/hr and underwent CPAP titration to 13cm H2O.  She is doing well with her CPAP device and thinks that she has gotten used to it.  She tolerates the mask and feels the pressure is adequate.  Since going on CPAP she feels rested in the am and has no significant daytime sleepiness.  She denies any significant mouth or nasal dryness or nasal congestion.  She does not think that he snores.     Past Medical History:  Diagnosis Date  . Asthma   . Asthma   . Chest pain    LHC 7/08: EF 60%, normal coronaries; ETT-Echo 6/11: EF 55-60%, no ischemia  . Chronic back pain   . CTS (carpal tunnel syndrome)    bil  . CTS (carpal tunnel syndrome)   . Hypothyroidism   . IBS (irritable bowel syndrome)   . Internal hemorrhoids   . Migraines   . OSA on CPAP 11/13/2017   Mild with AHI 13/hr   . Pulmonary nodule, right     Past Surgical History:  Procedure Laterality Date  . BREAST EXCISIONAL BIOPSY Right   . breast nodule surg     right breast nodule and left lymph node removed in 1994  . COLONOSCOPY  06/09/2012   Procedure: COLONOSCOPY;  Surgeon: Lafayette Dragon, MD;  Location: [facility];  Service: Endoscopy;  Laterality: N/A;  . HEMORRHOID SURGERY     2007  . LYMPH NODE BIOPSY     left breast  . NASAL SINUS SURGERY     1992  . TONSILLECTOMY     1981  . TOTAL VAGINAL HYSTERECTOMY      Current Medications: No outpatient medications have been marked as taking for the 11/14/17 encounter (Appointment) with Sueanne Margarita, MD.     Allergies:    Contrast media [iodinated diagnostic agents]; Fluticasone-salmeterol; Latex; and Sulfamethoxazole-trimethoprim   Social History   Socioeconomic History  . Marital status: Married    Spouse name: Catherine Hancock  . Number of children: 3  . Years of education: 2  . Highest education level: Not on file  Social Needs  . Financial resource strain: Not on file  . Food insecurity - worry: Not on file  . Food insecurity - inability: Not on file  . Transportation needs - medical: Not on file  . Transportation needs - non-medical: Not on file  Occupational History  . Not on file  Tobacco Use  . Smoking status: Never Smoker  . Smokeless tobacco: Never Used  Substance and Sexual Activity  . Alcohol use: No  . Drug use: No  . Sexual activity: Not on file  Other Topics Concern  . Not on file  Social History Narrative   Live at home with husband and son.   Right handed.   Caffeine use: drinks 1 pepsi/day      Family History: The patient's family history includes Breast cancer in her maternal aunt; COPD in her father;  Colon cancer in her maternal aunt, maternal grandmother, and maternal uncle; Heart disease in her maternal grandfather, paternal grandfather, and paternal uncle; Heart failure in her father; Hypertension in her father; Lymphoma in her mother; Mitral valve prolapse in her sister; Ovarian cancer in her maternal aunt; Prostate cancer in her father.  ROS:   Please see the history of present illness.    ROS  All other systems reviewed and negative.   EKGs/Labs/Other Studies Reviewed:    The following studies were reviewed today: CPAP download  EKG:  EKG is not ordered today.   Recent Labs: No results found for requested labs within last 8760 hours.   Recent Lipid Panel No results found for: CHOL, TRIG, HDL, CHOLHDL, VLDL, LDLCALC, LDLDIRECT  Physical Exam:    VS:  There were no vitals taken for this visit.    Wt Readings from Last 3 Encounters:  06/02/17 160 lb (72.6 kg)    05/06/17 162 lb (73.5 kg)  03/18/17 161 lb 9.6 oz (73.3 kg)     GEN:  Well nourished, well developed in no acute distress HEENT: Normal NECK: No JVD; No carotid bruits LYMPHATICS: No lymphadenopathy CARDIAC: RRR, no murmurs, rubs, gallops RESPIRATORY:  Clear to auscultation without rales, wheezing or rhonchi  ABDOMEN: Soft, non-tender, non-distended MUSCULOSKELETAL:  No edema; No deformity  SKIN: Warm and dry NEUROLOGIC:  Alert and oriented x 3 PSYCHIATRIC:  Normal affect   ASSESSMENT:    1. OSA on CPAP    PLAN:    In order of problems listed above:  1.  OSA - the patient is tolerating PAP therapy well without any problems. The PAP download was reviewed today and showed an AHI of ***/hr on *** cm H2O with ***% compliance in using more than 4 hours nightly.  The patient has been using and benefiting from PAP use and will continue to benefit from therapy.      Medication Adjustments/Labs and Tests Ordered: Current medicines are reviewed at length with the patient today.  Concerns regarding medicines are outlined above.  No orders of the defined types were placed in this encounter.  No orders of the defined types were placed in this encounter.   Signed, Fransico Him, MD  11/13/2017 9:32 PM    [place]

## 2017-11-14 ENCOUNTER — Ambulatory Visit: Payer: BLUE CROSS/BLUE SHIELD | Admitting: Cardiology

## 2018-02-08 ENCOUNTER — Telehealth: Payer: Self-pay | Admitting: *Deleted

## 2018-02-08 DIAGNOSIS — Z9989 Dependence on other enabling machines and devices: Principal | ICD-10-CM

## 2018-02-08 DIAGNOSIS — G4733 Obstructive sleep apnea (adult) (pediatric): Secondary | ICD-10-CM

## 2018-02-08 NOTE — Addendum Note (Signed)
Addended by: Freada Bergeron on: 02/08/2018 09:03 AM   Modules accepted: Orders

## 2018-02-08 NOTE — Telephone Encounter (Signed)
-----   Message from Sueanne Margarita, MD sent at 02/06/2018  8:44 PM EDT ----- AHI too high - increaes CPAP to 8cm H2O and repeat download in 6 weeks - please encouraged increased compliance

## 2018-02-08 NOTE — Telephone Encounter (Signed)
Informed patient of compliance results and verbalized understanding was indicated. Patient understands her events are too high at 5.7. Patient understands her cpap pressure will increase to 8cm H20 and we will repeat a download in 6 weeks. Patient has been encouraged to increase her compliance. Patient states she has been suffering with allergies and had been out of town for a while and did not take her machine with her but she will resume compliance. Patient is agreeable to treatment.

## 2018-03-29 ENCOUNTER — Other Ambulatory Visit: Payer: Self-pay | Admitting: Obstetrics & Gynecology

## 2018-03-29 DIAGNOSIS — N6320 Unspecified lump in the left breast, unspecified quadrant: Secondary | ICD-10-CM

## 2018-04-03 ENCOUNTER — Ambulatory Visit
Admission: RE | Admit: 2018-04-03 | Discharge: 2018-04-03 | Disposition: A | Discharge status: Home / Self Care | Payer: BLUE CROSS/BLUE SHIELD | Source: Ambulatory Visit | Attending: Obstetrics & Gynecology | Admitting: Obstetrics & Gynecology

## 2018-04-03 DIAGNOSIS — N6320 Unspecified lump in the left breast, unspecified quadrant: Secondary | ICD-10-CM

## 2018-05-31 IMAGING — MG 2D DIGITAL DIAGNOSTIC BILATERAL MAMMOGRAM WITH CAD AND ADJUNCT T
8 of 13 series · 8 of 29 positions shown · non-contrast
Comparison: Previous exam(s).

CLINICAL DATA: 56-year-old female presenting for evaluation of
numerous palpable abnormalities in the left breast.

EXAM:
2D DIGITAL DIAGNOSTIC BILATERAL MAMMOGRAM WITH CAD AND ADJUNCT TOMO
LEFT BREAST ULTRASOUND

[L MLO (1 of 2)]
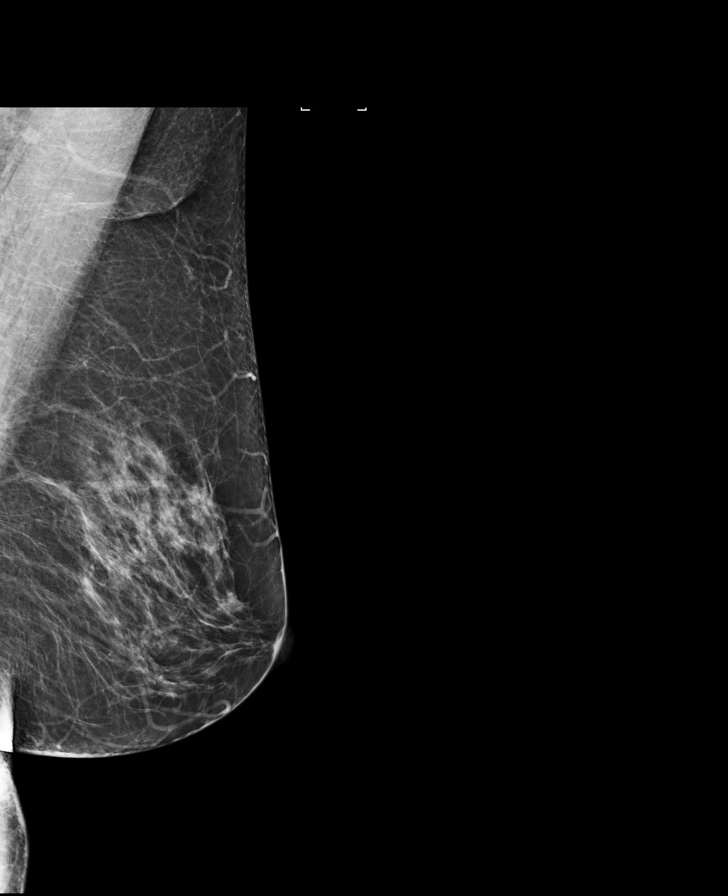

[R CC synth-2D]
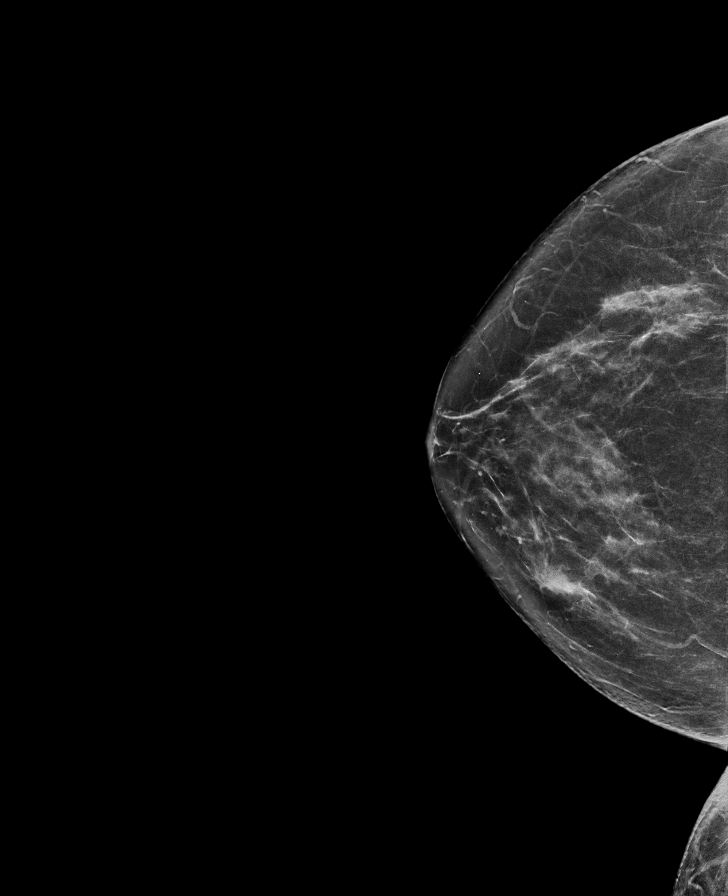

[R MLO]
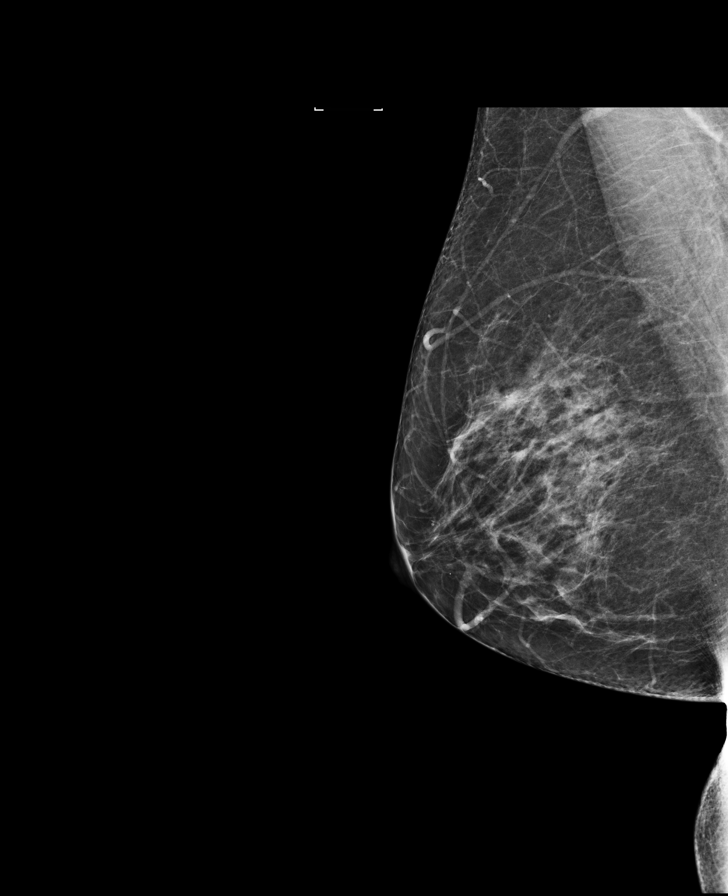

[L CC synth-2D]
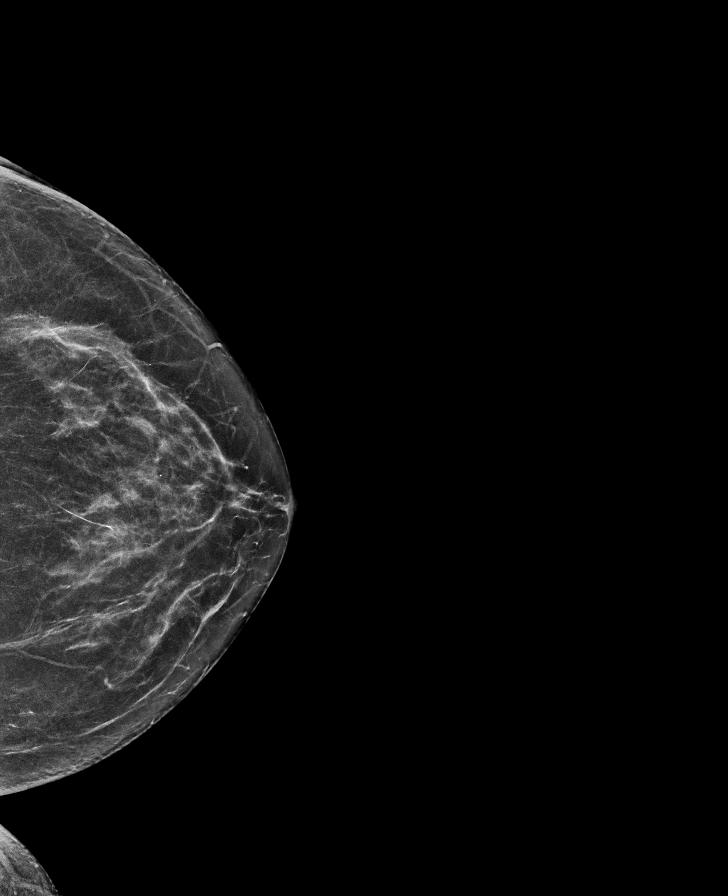

[R CC]
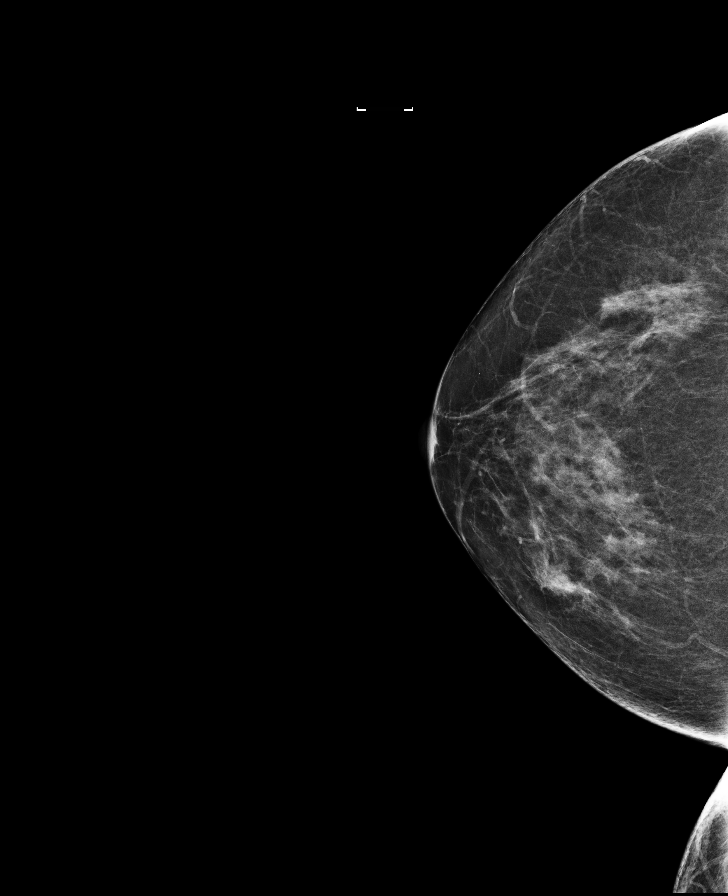

[R MLO synth-2D]
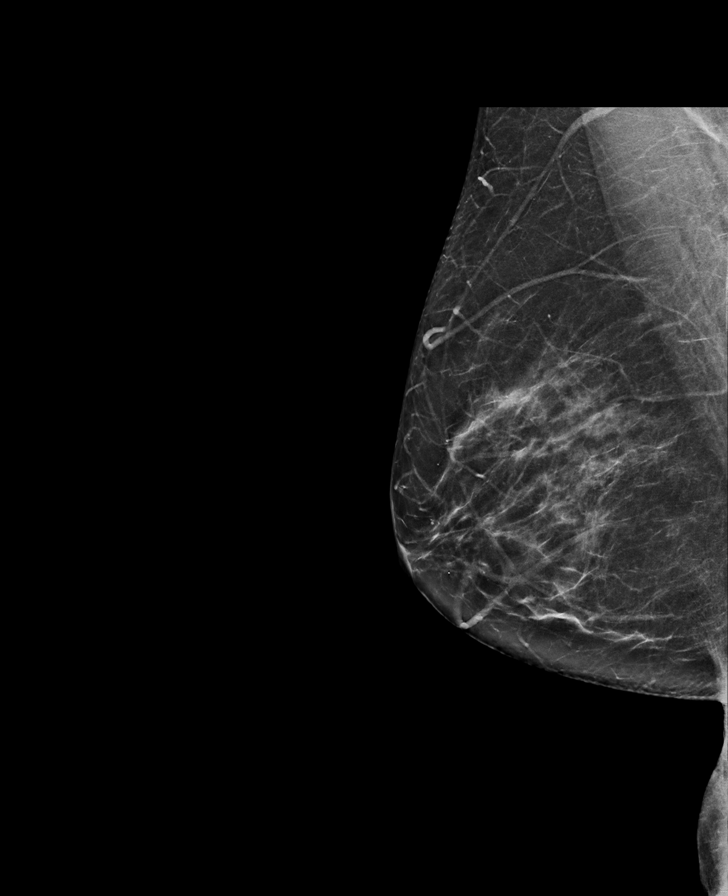

[L MLO synth-2D]
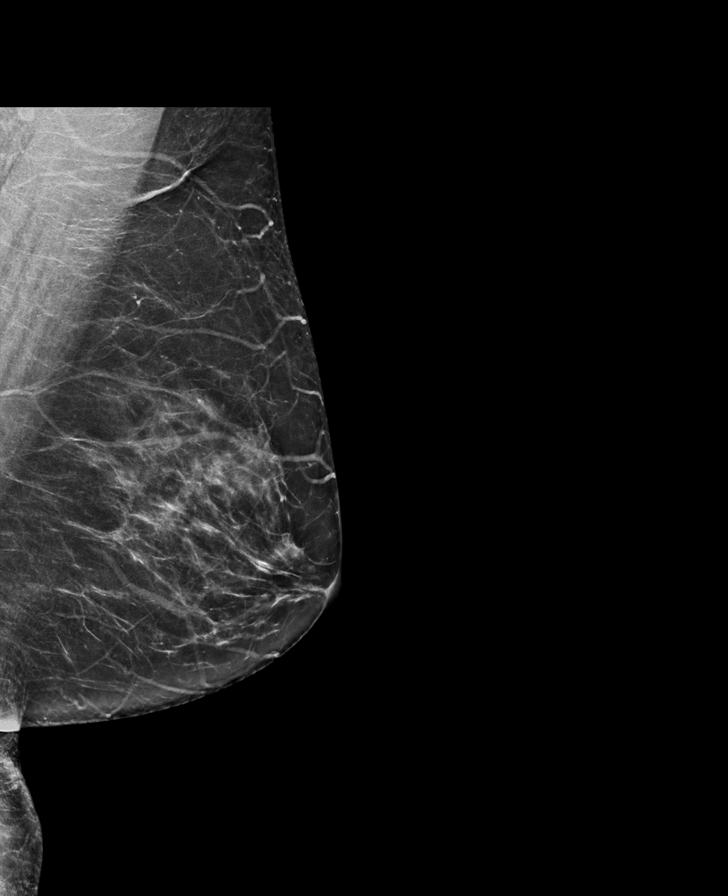

[L MLO (2 of 2)]
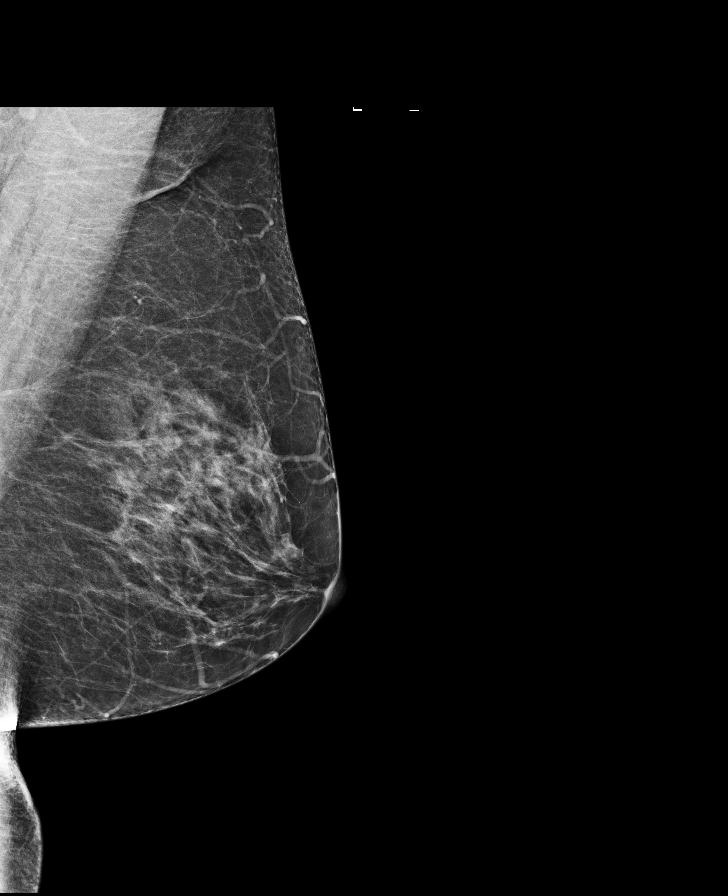

[8 of 29 positions shown; findings below may reference images not displayed]

ACR Breast Density Category c: The breast tissue is heterogeneously
dense, which may obscure small masses.
FINDINGS: No suspicious calcifications, masses or areas of distortion are seen
in the bilateral breasts.

Mammographic images were processed with CAD.

Physical exam of the upper inner, upper outer and lower outer
quadrants of the left breast demonstrate normal fibroglandular
tissue without discrete palpable mass.

Ultrasound of the palpable areas in the upper inner, upper outer and
inferior left breast demonstrates normal fibroglandular tissue. No
masses or suspicious areas of shadowing are identified. A 3 mm cyst
is identified at 5 o'clock, 2 cm from the nipple, however this is
too small to account for the palpable areas described by the
patient.
IMPRESSION: 1. There are no mammographic or targeted sonographic suspicious
abnormalities at the palpable sites of concern.

2.  No mammographic evidence of malignancy in the bilateral breasts.

RECOMMENDATION:
1. Clinical follow-up recommended for the palpable areas of concern
in the left breast. Any further workup should be based on clinical
grounds.

2.  Screening mammogram in one year.(Code:06-A-5G2)

I have discussed the findings and recommendations with the patient.
Results were also provided in writing at the conclusion of the
visit. If applicable, a reminder letter will be sent to the patient
regarding the next appointment.

BI-RADS CATEGORY  1: Negative.

## 2018-07-05 ENCOUNTER — Other Ambulatory Visit: Payer: Self-pay | Admitting: Obstetrics & Gynecology

## 2018-07-05 DIAGNOSIS — Z1231 Encounter for screening mammogram for malignant neoplasm of breast: Secondary | ICD-10-CM

## 2018-07-21 ENCOUNTER — Telehealth: Payer: Self-pay | Admitting: *Deleted

## 2018-07-21 NOTE — Telephone Encounter (Signed)
Patient called today to say she missed her 10 week sleep follow up appointment on 11/14/16 because of bad weather and she never rescheduled and would like to schedule her follow up visit today. Patient has been rescheduled for 09/07/2018. Pt is aware and agreeable treatment.

## 2018-08-11 ENCOUNTER — Ambulatory Visit: Payer: BLUE CROSS/BLUE SHIELD

## 2018-08-28 ENCOUNTER — Ambulatory Visit
Admission: RE | Admit: 2018-08-28 | Discharge: 2018-08-28 | Disposition: A | Discharge status: Home / Self Care | Payer: BLUE CROSS/BLUE SHIELD | Source: Ambulatory Visit | Attending: Obstetrics & Gynecology | Admitting: Obstetrics & Gynecology

## 2018-08-28 DIAGNOSIS — Z1231 Encounter for screening mammogram for malignant neoplasm of breast: Secondary | ICD-10-CM

## 2018-09-07 ENCOUNTER — Ambulatory Visit (INDEPENDENT_AMBULATORY_CARE_PROVIDER_SITE_OTHER): Payer: BLUE CROSS/BLUE SHIELD | Admitting: Cardiology

## 2018-09-07 ENCOUNTER — Telehealth: Payer: Self-pay | Admitting: *Deleted

## 2018-09-07 ENCOUNTER — Encounter: Payer: Self-pay | Admitting: Cardiology

## 2018-09-07 VITALS — BP 120/76 | HR 88 | Ht 60.0 in | Wt 156.0 lb

## 2018-09-07 DIAGNOSIS — Z9989 Dependence on other enabling machines and devices: Secondary | ICD-10-CM | POA: Diagnosis not present

## 2018-09-07 DIAGNOSIS — G4733 Obstructive sleep apnea (adult) (pediatric): Secondary | ICD-10-CM

## 2018-09-07 NOTE — Telephone Encounter (Signed)
-----   Message from Cleon Gustin, [place] sent at 09/07/2018  2:55 PM EST ----- Regarding: CPAP supplies Per Dr. Radford Pax, please order CPAP mask of choice and supplies. We will also need to recheck a download in 6 weeks. Thanks

## 2018-09-07 NOTE — Progress Notes (Signed)
Cardiology Office Note:    Date:  09/07/2018   ID:  Catherine Hancock, DOB 11-13-1960, MRN [id number]  PCP:  Tobe Sos, MD  Cardiologist:  No primary care provider on file.    Referring MD: Tobe Sos, MD   Chief Complaint  Patient presents with  . Sleep Apnea    History of Present Illness:    Catherine Hancock is a 57 y.o. female with a hx of asthma and noncardiac chest pain who was referred for sleep study by Dr. Saunders Revel.  She apparently was complaining of significant snoring and her Epworth Sleepiness Scale was 13.  Was found to have mild obstructive sleep apnea with an AHI 13.1 and underwent CPAP titration to 13 cm H2O.  She is now here for follow-up.  She is doing well with her CPAP device and thinks that she has gotten used to it.  She tolerates the nasal pillow mask and feels the pressure is adequate.  She still has some problems with feeling tired in the morning despite CPAP but she says this is been ongoing problem.  She is chronic mouth dryness but she has had this even before starting CPAP therapy and uses Biot[employer].  She does not think that he snores when using the device.  She has not been using her Pap therapy for about 3 weeks because she had an upper respiratory infection cannot breathe through the nasal pillow mask.  Prior to that she was tolerating it fine.   Past Medical History:  Diagnosis Date  . Asthma   . Asthma   . Chest pain    LHC 7/08: EF 60%, normal coronaries; ETT-Echo 6/11: EF 55-60%, no ischemia  . Chronic back pain   . CTS (carpal tunnel syndrome)    bil  . CTS (carpal tunnel syndrome)   . Hypothyroidism   . IBS (irritable bowel syndrome)   . Internal hemorrhoids   . Migraines   . OSA on CPAP 11/13/2017   Mild with AHI 13/hr   . Pulmonary nodule, right     Past Surgical History:  Procedure Laterality Date  . BREAST EXCISIONAL BIOPSY Right    benign  . breast nodule surg     right breast nodule and left lymph node removed in 1994  .  COLONOSCOPY  06/09/2012   Procedure: COLONOSCOPY;  Surgeon: Lafayette Dragon, MD;  Location: [facility];  Service: Endoscopy;  Laterality: N/A;  . EXCISION OF BREAST BIOPSY Left    2 benign lymph nodes removed  . HEMORRHOID SURGERY     2007  . LYMPH NODE BIOPSY     left breast  . NASAL SINUS SURGERY     1992  . TONSILLECTOMY     1981  . TOTAL VAGINAL HYSTERECTOMY      Current Medications: Current Meds  Medication Sig  . ALPRAZolam (XANAX) 0.5 MG tablet Take 0.25 mg by mouth as directed. PRN  . aspirin EC 81 MG tablet Take 1 tablet (81 mg total) by mouth daily.  . Azelastine HCl 0.15 % SOLN Place 1 Squirt into the nose as directed.   . budesonide-formoterol (SYMBICORT) 80-4.5 MCG/ACT inhaler Inhale 1 puff into the lungs 2 (two) times daily as needed.   . cyclobenzaprine (FLEXERIL) 10 MG tablet TAKE 1 TABLET THREE TIMES A DAY AS NEEDED FOR MUSCLE SPASMS  . DONNATAL 16.2 MG tablet Take 16.2 mg by mouth daily as needed. Stomach pain  . DULoxetine (CYMBALTA) 20 MG capsule Take 20 mg  by mouth at bedtime.  Marland Kitchen esomeprazole (NEXIUM) 40 MG capsule Take 40 mg by mouth 2 (two) times daily.  Marland Kitchen HYDROcodone-acetaminophen (NORCO/VICODIN) 5-325 MG per tablet Take 1 tablet by mouth daily as needed (pain).   . hyoscyamine (LEVSIN/SL) 0.125 MG SL tablet Place 1 tablet (0.125 mg total) under the tongue every 4 (four) hours as needed for cramping.  Marland Kitchen ibuprofen (ADVIL,MOTRIN) 800 MG tablet Take 800 mg by mouth every 8 (eight) hours as needed (pain).  . montelukast (SINGULAIR) 10 MG tablet Take 10 mg by mouth at bedtime.  . nitroGLYCERIN (NITROSTAT) 0.4 MG SL tablet Place 1 tablet (0.4 mg total) under the tongue every 5 (five) minutes as needed for chest pain.  . polyethylene glycol powder (GLYCOLAX/MIRALAX) powder Take 17 g by mouth as needed for mild constipation.   . Prenat-FeFum-Doc-FA-DHA w/o A (NEXA PLUS) 29-1.25-350 MG CAPS Take 1 tablet by mouth Daily.  Marland Kitchen PROAIR HFA 108 (90 BASE) MCG/ACT inhaler Take 2  puffs by mouth 4 (four) times daily. PRN  . SYNTHROID 75 MCG tablet Take 75 mcg by mouth daily.  Marland Kitchen topiramate (TOPAMAX) 50 MG tablet Take 50 mg by mouth every morning. Take 100mg  by mouth at night  . zolpidem (AMBIEN) 10 MG tablet Take 0.5 tablets by mouth at bedtime as needed for sleep.      Allergies:   Gadolinium derivatives; Latex; Fluticasone-salmeterol; Contrast media [iodinated diagnostic agents]; and Sulfamethoxazole-trimethoprim   Social History   Socioeconomic History  . Marital status: Married    Spouse name: Catherine Hancock  . Number of children: 3  . Years of education: 57  . Highest education level: Not on file  Occupational History  . Not on file  Social Needs  . Financial resource strain: Not on file  . Food insecurity:    Worry: Not on file    Inability: Not on file  . Transportation needs:    Medical: Not on file    Non-medical: Not on file  Tobacco Use  . Smoking status: Never Smoker  . Smokeless tobacco: Never Used  Substance and Sexual Activity  . Alcohol use: No  . Drug use: No  . Sexual activity: Not on file  Lifestyle  . Physical activity:    Days per week: Not on file    Minutes per session: Not on file  . Stress: Not on file  Relationships  . Social connections:    Talks on phone: Not on file    Gets together: Not on file    Attends religious service: Not on file    Active member of club or organization: Not on file    Attends meetings of clubs or organizations: Not on file    Relationship status: Not on file  Other Topics Concern  . Not on file  Social History Narrative   Live at home with husband and son.   Right handed.   Caffeine use: drinks 1 pepsi/day      Family History: The patient's family history includes Breast cancer in her maternal aunt; COPD in her father; Colon cancer in her maternal aunt, maternal grandmother, and maternal uncle; Heart disease in her maternal grandfather, paternal grandfather, and paternal uncle; Heart failure  in her father; Hypertension in her father; Lymphoma in her mother; Mitral valve prolapse in her sister; Ovarian cancer in her maternal aunt; Prostate cancer in her father.  ROS:   Please see the history of present illness.    ROS  All other systems reviewed and negative.  EKGs/Labs/Other Studies Reviewed:    The following studies were reviewed today: PAP download  EKG:  EKG is not ordered today.    Recent Labs: No results found for requested labs within last 8760 hours.   Recent Lipid Panel No results found for: CHOL, TRIG, HDL, CHOLHDL, VLDL, LDLCALC, LDLDIRECT  Physical Exam:    VS:  BP 120/76   Pulse 88   Ht 5' (1.524 m)   Wt 156 lb (70.8 kg)   SpO2 98%   BMI 30.47 kg/m     Wt Readings from Last 3 Encounters:  09/07/18 156 lb (70.8 kg)  06/02/17 160 lb (72.6 kg)  05/06/17 162 lb (73.5 kg)     GEN:  Well nourished, well developed in no acute distress HEENT: Normal NECK: No JVD; No carotid bruits LYMPHATICS: No lymphadenopathy CARDIAC: RRR, no murmurs, rubs, gallops RESPIRATORY:  Clear to auscultation without rales, wheezing or rhonchi  ABDOMEN: Soft, non-tender, non-distended MUSCULOSKELETAL:  No edema; No deformity  SKIN: Warm and dry NEUROLOGIC:  Alert and oriented x 3 PSYCHIATRIC:  Normal affect   ASSESSMENT:    1. OSA on CPAP    PLAN:    In order of problems listed above:  1.  OSA - the patient is tolerating PAP therapy well without any problems has not been using it over the past 3 weeks because she had an upper respiratory infection and could not breathe through the nasal pillow mask.  When she does use that she tolerates quite well.. The PAP download was reviewed today and showed an AHI of 6.6/hr on 7 cm H2O with 10% compliance in using more than 4 hours nightly a recent URI.  The patient has been using and benefiting from PAP use and will continue to benefit from therapy.  She uses a nasal pillow mask at present but would like to try a nasal mask to  try to cut down on the mask leakage.  I will give her a prescription for a Pap mask of choice and she can talk with her DME company.  I am going to get a download in about 6 weeks so that we can document compliance.     Medication Adjustments/Labs and Tests Ordered: Current medicines are reviewed at length with the patient today.  Concerns regarding medicines are outlined above.  No orders of the defined types were placed in this encounter.  No orders of the defined types were placed in this encounter.   Signed, Fransico Him, MD  09/07/2018 2:40 PM    [place]

## 2018-09-07 NOTE — Patient Instructions (Signed)
Medication Instructions:  Your physician recommends that you continue on your current medications as directed. Please refer to the Current Medication list given to you today.  If you need a refill on your cardiac medications before your next appointment, please call your pharmacy.   Lab work: None Ordered  If you have labs (blood work) drawn today and your tests are completely normal, you will receive your results only by: Marland Kitchen MyChart Message (if you have MyChart) OR . A paper copy in the mail If you have any lab test that is abnormal or we need to change your treatment, we will call you to review the results.  Testing/Procedures: None ordered  Follow-Up: Your physician wants you to follow-up in: 1 year with Dr. Radford Pax. You will receive a reminder letter in the mail two months in advance. If you don't receive a letter, please call our office to schedule the follow-up appointment.  Any Other Special Instructions Will Be Listed Below (If Applicable).  We will order you a CPAP mask of choice and supplies  We will recheck a download in 6 weeks.

## 2018-09-07 NOTE — Telephone Encounter (Signed)
Order faxed to [facility].

## 2018-09-08 ENCOUNTER — Encounter: Payer: Self-pay | Admitting: Cardiology

## 2018-09-08 ENCOUNTER — Ambulatory Visit (INDEPENDENT_AMBULATORY_CARE_PROVIDER_SITE_OTHER): Payer: BLUE CROSS/BLUE SHIELD | Admitting: Cardiology

## 2018-09-08 VITALS — BP 122/70 | HR 76 | Ht 60.0 in | Wt 157.4 lb

## 2018-09-08 DIAGNOSIS — Z7189 Other specified counseling: Secondary | ICD-10-CM | POA: Diagnosis not present

## 2018-09-08 DIAGNOSIS — R0789 Other chest pain: Secondary | ICD-10-CM

## 2018-09-08 NOTE — Progress Notes (Signed)
Cardiology Office Note:    Date:  09/08/2018   ID:  [name], DOB 15-Feb-1961, MRN [id number]  PCP:  Tobe Sos, MD  Cardiologist:  Buford Dresser, MD PhD (previously Dr. Saunders Revel)  Referring MD: Tobe Sos, MD   CC: Follow up  History of Present Illness:    Catherine Hancock is a 57 y.o. female with a hx of noncardiac chest pain, irritable bowel syndrome, hypothyroidism, OSA on CPAP who is seen as a new patient to me (establisehd with Dr. Saunders Revel) for the evaluation and management of chest pain. She was last seen by Dr. Saunders Revel on 05/06/17.   Continues to have chest pain. Happens several times/week. Random, not related to activity, position, etc. Sometimes wakes her up from sleep. About a month ago, had some hand/arm tingling that lasted about 3-4 hours before it resolved. Has associated mild shortness of breath with it. Occasionally also feels like "the room is moving and I'm not." Has not taken nitroglycerin. Imdur did not help her symptoms.   Has has two esophageal dilations because of food getting stuck. Dilations do not seem to affect her chest pain. Also has a history of asthma, feels like she "catches everything" when people are ill. Recently treated for bronchitis, has had some shortness of breath related to that.  Other than symptoms above, denies PND, orthopnea, syncope, palpitations. Here with her husband and grandkids today. Discussed lifestyle recommendations with her and her husband.  Past Medical History:  Diagnosis Date  . Asthma   . Asthma   . Chest pain    LHC 7/08: EF 60%, normal coronaries; ETT-Echo 6/11: EF 55-60%, no ischemia  . Chronic back pain   . CTS (carpal tunnel syndrome)    bil  . CTS (carpal tunnel syndrome)   . Hypothyroidism   . IBS (irritable bowel syndrome)   . Internal hemorrhoids   . Migraines   . OSA on CPAP 11/13/2017   Mild with AHI 13/hr   . Pulmonary nodule, right     Past Surgical History:  Procedure Laterality Date  .  BREAST EXCISIONAL BIOPSY Right    benign  . breast nodule surg     right breast nodule and left lymph node removed in 1994  . COLONOSCOPY  06/09/2012   Procedure: COLONOSCOPY;  Surgeon: Lafayette Dragon, MD;  Location: [facility];  Service: Endoscopy;  Laterality: N/A;  . EXCISION OF BREAST BIOPSY Left    2 benign lymph nodes removed  . HEMORRHOID SURGERY     2007  . LYMPH NODE BIOPSY     left breast  . NASAL SINUS SURGERY     1992  . TONSILLECTOMY     1981  . TOTAL VAGINAL HYSTERECTOMY      Current Medications: Current Outpatient Medications on File Prior to Visit  Medication Sig  . ALPRAZolam (XANAX) 0.5 MG tablet Take 0.25 mg by mouth as directed. PRN  . aspirin EC 81 MG tablet Take 1 tablet (81 mg total) by mouth daily.  . Azelastine HCl 0.15 % SOLN Place 1 Squirt into the nose as directed.   . budesonide-formoterol (SYMBICORT) 80-4.5 MCG/ACT inhaler Inhale 1 puff into the lungs 2 (two) times daily as needed.   . cyclobenzaprine (FLEXERIL) 10 MG tablet TAKE 1 TABLET THREE TIMES A DAY AS NEEDED FOR MUSCLE SPASMS  . DONNATAL 16.2 MG tablet Take 16.2 mg by mouth daily as needed. Stomach pain  . DULoxetine (CYMBALTA) 20 MG capsule Take 20 mg  by mouth at bedtime.  Marland Kitchen HYDROcodone-acetaminophen (NORCO/VICODIN) 5-325 MG per tablet Take 1 tablet by mouth daily as needed (pain).   Marland Kitchen ibuprofen (ADVIL,MOTRIN) 800 MG tablet Take 800 mg by mouth every 8 (eight) hours as needed (pain).  . montelukast (SINGULAIR) 10 MG tablet Take 10 mg by mouth at bedtime.  . polyethylene glycol powder (GLYCOLAX/MIRALAX) powder Take 17 g by mouth as needed for mild constipation.   . Prenat-FeFum-Doc-FA-DHA w/o A (NEXA PLUS) 29-1.25-350 MG CAPS Take 1 tablet by mouth Daily.  Marland Kitchen PROAIR HFA 108 (90 BASE) MCG/ACT inhaler Take 2 puffs by mouth 4 (four) times daily. PRN  . SYNTHROID 75 MCG tablet Take 75 mcg by mouth daily.  Marland Kitchen topiramate (TOPAMAX) 50 MG tablet Take 50 mg by mouth every morning. Take 100mg  by mouth at  night  . zolpidem (AMBIEN) 10 MG tablet Take 0.5 tablets by mouth at bedtime as needed for sleep.   Marland Kitchen esomeprazole (NEXIUM) 40 MG capsule Take 40 mg by mouth 2 (two) times daily.  . hyoscyamine (LEVSIN/SL) 0.125 MG SL tablet Place 1 tablet (0.125 mg total) under the tongue every 4 (four) hours as needed for cramping.  . nitroGLYCERIN (NITROSTAT) 0.4 MG SL tablet Place 1 tablet (0.4 mg total) under the tongue every 5 (five) minutes as needed for chest pain.   No current facility-administered medications on file prior to visit.      Allergies:   Gadolinium derivatives; Latex; Fluticasone-salmeterol; Contrast media [iodinated diagnostic agents]; and Sulfamethoxazole-trimethoprim   Social History   Socioeconomic History  . Marital status: Married    Spouse name: Doren Custard  . Number of children: 3  . Years of education: 52  . Highest education level: Not on file  Occupational History  . Not on file  Social Needs  . Financial resource strain: Not on file  . Food insecurity:    Worry: Not on file    Inability: Not on file  . Transportation needs:    Medical: Not on file    Non-medical: Not on file  Tobacco Use  . Smoking status: Never Smoker  . Smokeless tobacco: Never Used  Substance and Sexual Activity  . Alcohol use: No  . Drug use: No  . Sexual activity: Not on file  Lifestyle  . Physical activity:    Days per week: Not on file    Minutes per session: Not on file  . Stress: Not on file  Relationships  . Social connections:    Talks on phone: Not on file    Gets together: Not on file    Attends religious service: Not on file    Active member of club or organization: Not on file    Attends meetings of clubs or organizations: Not on file    Relationship status: Not on file  Other Topics Concern  . Not on file  Social History Narrative   Live at home with husband and son.   Right handed.   Caffeine use: drinks 1 pepsi/day      Family History: The patient's family  history includes Breast cancer in her maternal aunt; COPD in her father; Colon cancer in her maternal aunt, maternal grandmother, and maternal uncle; Heart disease in her maternal grandfather, paternal grandfather, and paternal uncle; Heart failure in her father; Hypertension in her father; Lymphoma in her mother; Mitral valve prolapse in her sister; Ovarian cancer in her maternal aunt; Prostate cancer in her father.  ROS:   Please see the history of present  illness.  Additional pertinent ROS:  Constitutional: Negative for chills, fever, night sweats, unintentional weight loss  HENT: Negative for ear pain and hearing loss.   Eyes: Negative for loss of vision and eye pain.  Respiratory: Positive for shortness of breath, cough, clear sputum. Negative for wheezing.   Cardiovascular: Positive for chest pain. Negative for palpitations, PND, orthopnea, lower extremity edema and claudication.  Gastrointestinal: Negative for abdominal pain, melena, and hematochezia.  Genitourinary: Negative for dysuria and hematuria.  Musculoskeletal: Negative for falls and myalgias.  Skin: Negative for itching and rash.  Neurological: Negative for focal weakness, focal sensory changes and loss of consciousness.  Endo/Heme/Allergies: Does not bruise/bleed easily.    EKGs/Labs/Other Studies Reviewed:    The following studies were reviewed today: Cath/PCI:  LHC (02/01/07): LMCA normal. LAD with 2 large diagonal branches, normal. Dominant LCx without significant disease. Nondominant RCA without significant disease. LVEF 60% without wall motion abnormalities.  CV Surgery:  None  EP Procedures and Devices:  None  Non-Invasive Evaluation(s):  Pharmacologic MPI (04/15/17): Normal study without evidence of ischemia or scar. LVEF 71%.  Exercise stress echo (04/04/17): Submaximal workload achieving 81% MPHR. Normal LVEF at baseline with hyperdynamic response to stress. No EKG changes at submaximal  workload.  Cardiac CTA (02/23/12): Coronary calcium score 0. Left dominant. Normal coronary arteries.  Exercise stress echo (01/14/12): Normal resting and stress echo images. Positive EKG changes and recovery with ST depression. Overall low risk study.  TTE (01/10/12): Normal LV size and function (EF 60-65%) with normal diastolic function. Normal RV size and function. No significant valvular abnormalities.  Exercise stress echo (04/13/10): Normal EKG and stress echocardiogram without evidence of ischemia.  TTE (04/13/10): Normal LV size and thickness (EF 55-60%). Normal diastolic function. No significant valvular abnormalities. Normal RV size and function.  EKG:  EKG is personally reviewed.  The ekg ordered previously demonstrates normal sinus rhythm  Recent Labs: No results found for requested labs within last 8760 hours.  Recent Lipid Panel No results found for: CHOL, TRIG, HDL, CHOLHDL, VLDL, LDLCALC, LDLDIRECT  Physical Exam:    VS:  BP 122/70   Pulse 76   Ht 5' (1.524 m)   Wt 157 lb 6.4 oz (71.4 kg)   SpO2 99%   BMI 30.74 kg/m     Wt Readings from Last 3 Encounters:  09/08/18 157 lb 6.4 oz (71.4 kg)  09/07/18 156 lb (70.8 kg)  06/02/17 160 lb (72.6 kg)     GEN: Well nourished, well developed in no acute distress HEENT: Normal NECK: No JVD; No carotid bruits LYMPHATICS: No lymphadenopathy CARDIAC: regular rhythm, normal S1 and S2, no murmurs, rubs, gallops. Radial and DP pulses 2+ bilaterally. RESPIRATORY:  Clear to auscultation without rales, wheezing or rhonchi  ABDOMEN: Soft, non-tender, non-distended MUSCULOSKELETAL:  No edema; No deformity  SKIN: Warm and dry NEUROLOGIC:  Alert and oriented x 3 PSYCHIATRIC:  Normal affect   ASSESSMENT:    No diagnosis found. PLAN:    1. Atypical chest pain: has no obstructive CAD by cath. Question microvascular disease, but no improvement with imdur. We discussed at length the other possible causes of chest pain. She does have  esophageal/GI history including dilations, but she does not feel that this is her chest pain. -counseled on red flag symptoms that warrant immediate medical attention -has PRN SL NG ordered -low suspicion that this is cardiac, no further testing at this time.  2. Obstructive sleep apnea: seen by Dr. Radford Pax yesterday, titrating CPAP  3. Prevention: -recommend heart healthy/Mediterranean diet, with whole grains, fruits, vegetable, fish, lean meats, nuts, and olive oil. Limit salt. -recommend moderate walking, 3-5 times/week for 30-50 minutes each session. Aim for at least 150 minutes.week. Goal should be pace of 3 miles/hours, or walking 1.5 miles in 30 minutes -recommend avoidance of tobacco products. Avoid excess alcohol. -Additional risk factor control:  -Diabetes: A1c is not available  -Lipids: she will have these sent from PCP  -Blood pressure control: at goal  -Weight: BMI 30.7 -takes 81 mg aspirin daily. Given her GI symptoms and lack of CAD, if this leads to melena or GI upset, ok to stop.  Plan for follow up: 6 mos  Medication Adjustments/Labs and Tests Ordered: Current medicines are reviewed at length with the patient today.  Concerns regarding medicines are outlined above.  No orders of the defined types were placed in this encounter.  No orders of the defined types were placed in this encounter.   Patient Instructions  Medication Instructions:  Your Physician recommend you continue on your current medication as directed.    If you need a refill on your cardiac medications before your next appointment, please call your pharmacy.   Lab work: None   Testing/Procedures: None  Follow-Up: At [facility], you and your health needs are our priority.  As part of our continuing mission to provide you with exceptional heart care, we have created designated Provider Care Teams.  These Care Teams include your primary Cardiologist (physician) and Advanced Practice Providers  (APPs -  Physician Assistants and Nurse Practitioners) who all work together to provide you with the care you need, when you need it. You will need a follow up appointment in 6 months.  Please call our office 2 months in advance to schedule this appointment.  You may see Dr. Harrell Gave or one of the following Advanced Practice Providers on your designated Care Team:   Rosaria Ferries, PA-C . Jory Sims, DNP, ANP  Any Other Special Instructions Will Be Listed Below (If Applicable).       Signed, Buford Dresser, MD PhD 09/08/2018 10:09 AM    [place]

## 2018-09-08 NOTE — Patient Instructions (Signed)

## 2018-09-11 ENCOUNTER — Encounter: Payer: Self-pay | Admitting: Cardiology

## 2018-12-29 DIAGNOSIS — J324 Chronic pansinusitis: Secondary | ICD-10-CM | POA: Insufficient documentation

## 2019-01-15 ENCOUNTER — Telehealth: Payer: Self-pay | Admitting: *Deleted

## 2019-01-15 NOTE — Telephone Encounter (Signed)
Patient had dental implants put in the front of her mouth that interferes with wearing her cpap and she has a sinus blockage in one side of her nostrils that needs surgery very soon. This is why the patient is not using her PAP therapy enough.

## 2019-06-04 ENCOUNTER — Telehealth: Payer: Self-pay | Admitting: Cardiology

## 2019-06-04 ENCOUNTER — Other Ambulatory Visit: Payer: Self-pay

## 2019-06-04 ENCOUNTER — Encounter: Payer: Self-pay | Admitting: Cardiology

## 2019-06-04 ENCOUNTER — Ambulatory Visit (INDEPENDENT_AMBULATORY_CARE_PROVIDER_SITE_OTHER): Payer: BC Managed Care – PPO | Admitting: Cardiology

## 2019-06-04 VITALS — BP 128/60 | HR 91 | Temp 97.9°F | Ht 60.0 in | Wt 152.0 lb

## 2019-06-04 DIAGNOSIS — R000 Tachycardia, unspecified: Secondary | ICD-10-CM

## 2019-06-04 DIAGNOSIS — Z7189 Other specified counseling: Secondary | ICD-10-CM | POA: Diagnosis not present

## 2019-06-04 DIAGNOSIS — R0789 Other chest pain: Secondary | ICD-10-CM | POA: Diagnosis not present

## 2019-06-04 NOTE — Telephone Encounter (Signed)
Spoke to pt to remind of appt today with Dr. Harrell Gave today at 2:20 PM. Pt stated she is on her way here.

## 2019-06-04 NOTE — Progress Notes (Signed)
Cardiology Office Note:    Date:  06/04/2019   ID:  Catherine Hancock, DOB [birth year], MRN [id number]  PCP:  Tobe Sos, MD  Cardiologist:  Buford Dresser, MD PhD (previously Dr. Saunders Revel)  Referring MD: Tobe Sos, MD   CC: Follow up  History of Present Illness:    Catherine Hancock is a 58 y.o. female with a hx of atypical chest pain, irritable bowel syndrome, hypothyroidism, OSA on CPAP who is seen for follow up today.  Today: Chest pain continues, no clear pattern, not exertional. Feels like it is getting worse, gets weak spells with them. Does get some improvement with nitroglycerin on occasion. No significant change from prior. Spent time today reviewing her prior workup. Also showed her graphs/data today of the positive predictive value of a calcium score of 0. She understandably had concerns about how long normal tests were "good" for in terms of CAD. We discussed this, how plaques form, etc.   Heart rate: has noted elevated HR intermittently. Had one episode recently when HR was 119, felt off at the time.Tachycardia episodes not related to the chest pain. Got an apple watch, was told some of her episodes were inconclusive when she tried to use the ECG feature. Feels it 1-2 times/week.   Multiple concerns, including chronic leg pain (constant, not claudication), frequent falls/unsteadiness possibly with one LOC spell after she fell. Feels like she cannot walk straight, feels that she staggers and has frequent dizzy spells.   Sees Dr. Denice Paradise in [facility] for GI, still with intermittent esophageal dysphagia. as has two esophageal dilations because of food getting stuck. Dilations do not seem to affect her chest pain. Also has a history of asthma, breathing unchanged.  Past Medical History:  Diagnosis Date   Asthma    Asthma    Chest pain    LHC 7/08: EF 60%, normal coronaries; ETT-Echo 6/11: EF 55-60%, no ischemia   Chronic back pain    CTS (carpal tunnel  syndrome)    bil   CTS (carpal tunnel syndrome)    Hypothyroidism    IBS (irritable bowel syndrome)    Internal hemorrhoids    Migraines    OSA on CPAP 11/13/2017   Mild with AHI 13/hr    Pulmonary nodule, right     Past Surgical History:  Procedure Laterality Date   BREAST EXCISIONAL BIOPSY Right    benign   breast nodule surg     right breast nodule and left lymph node removed in 1994   COLONOSCOPY  06/09/2012   Procedure: COLONOSCOPY;  Surgeon: Lafayette Dragon, MD;  Location: [facility];  Service: Endoscopy;  Laterality: N/A;   EXCISION OF BREAST BIOPSY Left    2 benign lymph nodes removed   HEMORRHOID SURGERY     2007   LYMPH NODE BIOPSY     left breast   NASAL SINUS SURGERY     1992   TONSILLECTOMY     1981   TOTAL VAGINAL HYSTERECTOMY      Current Medications: Current Outpatient Medications on File Prior to Visit  Medication Sig   ALPRAZolam (XANAX) 0.5 MG tablet Take 0.25 mg by mouth as directed. PRN   aspirin EC 81 MG tablet Take 1 tablet (81 mg total) by mouth daily.   Azelastine HCl 0.15 % SOLN Place 1 Squirt into the nose as directed.    budesonide-formoterol (SYMBICORT) 80-4.5 MCG/ACT inhaler Inhale 1 puff into the lungs 2 (two) times daily as needed.  citalopram (CELEXA) 20 MG tablet Take 1 tablet by mouth daily.   cyclobenzaprine (FLEXERIL) 10 MG tablet TAKE 1 TABLET THREE TIMES A DAY AS NEEDED FOR MUSCLE SPASMS   DONNATAL 16.2 MG tablet Take 16.2 mg by mouth daily as needed. Stomach pain   EPINEPHrine 0.3 mg/0.3 mL IJ SOAJ injection as needed.   HYDROcodone-acetaminophen (NORCO/VICODIN) 5-325 MG per tablet Take 1 tablet by mouth daily as needed (pain).    ibuprofen (ADVIL,MOTRIN) 800 MG tablet Take 800 mg by mouth every 8 (eight) hours as needed (pain).   montelukast (SINGULAIR) 10 MG tablet Take 10 mg by mouth at bedtime.   polyethylene glycol powder (GLYCOLAX/MIRALAX) powder Take 17 g by mouth as needed for mild constipation.     Prenat-FeFum-Doc-FA-DHA w/o A (NEXA PLUS) 29-1.25-350 MG CAPS Take 1 tablet by mouth Daily.   PROAIR HFA 108 (90 BASE) MCG/ACT inhaler Take 2 puffs by mouth 4 (four) times daily. PRN   SYNTHROID 75 MCG tablet Take 75 mcg by mouth daily.   topiramate (TOPAMAX) 50 MG tablet Take 50 mg by mouth every morning. Take 100mg  by mouth at night   zolpidem (AMBIEN) 10 MG tablet Take 0.5 tablets by mouth at bedtime as needed for sleep.    esomeprazole (NEXIUM) 40 MG capsule Take 40 mg by mouth 2 (two) times daily.   nitroGLYCERIN (NITROSTAT) 0.4 MG SL tablet Place 1 tablet (0.4 mg total) under the tongue every 5 (five) minutes as needed for chest pain.   No current facility-administered medications on file prior to visit.      Allergies:   Gadolinium derivatives, Latex, Fluticasone-salmeterol, Contrast media [iodinated diagnostic agents], and Sulfamethoxazole-trimethoprim   Social History   Socioeconomic History   Marital status: Married    Spouse name: Doren Custard   Number of children: 3   Years of education: 12   Highest education level: Not on file  Occupational History   Not on file  Social Needs   Financial resource strain: Not on file   Food insecurity    Worry: Not on file    Inability: Not on file   Transportation needs    Medical: Not on file    Non-medical: Not on file  Tobacco Use   Smoking status: Never Smoker   Smokeless tobacco: Never Used  Substance and Sexual Activity   Alcohol use: No   Drug use: No   Sexual activity: Not on file  Lifestyle   Physical activity    Days per week: Not on file    Minutes per session: Not on file   Stress: Not on file  Relationships   Social connections    Talks on phone: Not on file    Gets together: Not on file    Attends religious service: Not on file    Active member of club or organization: Not on file    Attends meetings of clubs or organizations: Not on file    Relationship status: Not on file  Other  Topics Concern   Not on file  Social History Narrative   Live at home with husband and son.   Right handed.   Caffeine use: drinks 1 pepsi/day      Family History: The patient's family history includes Breast cancer in her maternal aunt; COPD in her father; Colon cancer in her maternal aunt, maternal grandmother, and maternal uncle; Heart disease in her maternal grandfather, paternal grandfather, and paternal uncle; Heart failure in her father; Hypertension in her father; Lymphoma in her  mother; Mitral valve prolapse in her sister; Ovarian cancer in her maternal aunt; Prostate cancer in her father.  ROS:   Please see the history of present illness.  Additional pertinent ROS: Constitutional: Negative for chills, fever, night sweats, unintentional weight loss  HENT: Negative for ear pain and hearing loss.   Eyes: Negative for loss of vision and eye pain.  Respiratory: See HPI. Cardiovascular: See HPI. Gastrointestinal: Negative for abdominal pain, melena, and hematochezia.  Genitourinary: Negative for dysuria and hematuria.  Musculoskeletal: Negative for myalgias. Positive for falls Skin: Negative for itching and rash.  Neurological: Negative for focal weakness, focal sensory changes  Endo/Heme/Allergies: Does not bruise/bleed easily.    EKGs/Labs/Other Studies Reviewed:    The following studies were reviewed today: Cath/PCI:  LHC (02/01/07): LMCA normal. LAD with 2 large diagonal branches, normal. Dominant LCx without significant disease. Nondominant RCA without significant disease. LVEF 60% without wall motion abnormalities.  CV Surgery:  None  EP Procedures and Devices:  None  Non-Invasive Evaluation(s):  Pharmacologic MPI (04/15/17): Normal study without evidence of ischemia or scar. LVEF 71%.  Exercise stress echo (04/04/17): Submaximal workload achieving 81% MPHR. Normal LVEF at baseline with hyperdynamic response to stress. No EKG changes at submaximal  workload.  Cardiac CTA (02/23/12): Coronary calcium score 0. Left dominant. Normal coronary arteries.  Exercise stress echo (01/14/12): Normal resting and stress echo images. Positive EKG changes and recovery with ST depression. Overall low risk study.  TTE (01/10/12): Normal LV size and function (EF 60-65%) with normal diastolic function. Normal RV size and function. No significant valvular abnormalities.  Exercise stress echo (04/13/10): Normal EKG and stress echocardiogram without evidence of ischemia.  TTE (04/13/10): Normal LV size and thickness (EF 55-60%). Normal diastolic function. No significant valvular abnormalities. Normal RV size and function.  EKG:  EKG is personally reviewed.  The ekg ordered 03/18/17 demonstrates normal sinus rhythm  Recent Labs: No results found for requested labs within last 8760 hours.  Recent Lipid Panel No results found for: CHOL, TRIG, HDL, CHOLHDL, VLDL, LDLCALC, LDLDIRECT  Physical Exam:    VS:  BP 128/60    Pulse 91    Temp 97.9 F (36.6 C)    Ht 5' (1.524 m)    Wt 152 lb (68.9 kg)    BMI 29.69 kg/m     Wt Readings from Last 3 Encounters:  06/04/19 152 lb (68.9 kg)  09/08/18 157 lb 6.4 oz (71.4 kg)  09/07/18 156 lb (70.8 kg)    GEN: Well nourished, well developed in no acute distress HEENT: Normal, moist mucous membranes NECK: No JVD CARDIAC: regular rhythm, normal S1 and S2, no murmurs, rubs, gallops.  VASCULAR: Radial and DP pulses 2+ bilaterally. No carotid bruits RESPIRATORY:  Clear to auscultation without rales, wheezing or rhonchi  ABDOMEN: Soft, non-tender, non-distended MUSCULOSKELETAL:  Ambulates independently SKIN: Warm and dry, no edema NEUROLOGIC:  Alert and oriented x 3. No focal neuro deficits noted. PSYCHIATRIC:  Normal affect   ASSESSMENT:    1. Tachycardia   2. Other chest pain   3. Cardiac risk counseling   4. Counseling on health promotion and disease prevention    PLAN:    Paroxysmal tachycardia: unclear  etiology. Regular rate and rhythm on exam today. Not clearly captured by apple watch -given frequency, will order 2 week Zio monitor. Instructed on how to wear. She plans to swim when her grandchildren come to visit soon, would like to wear it after that -if abnormal, would check thyroid,  BMET, CBC, lipids if not recently done by PCP. No labs available to me today.  Atypical chest pain: has no obstructive CAD by cath. Question microvascular disease, but no improvement with imdur. We discussed at length the other possible causes of chest pain. She does have esophageal/GI history including dilations, but she does not feel that this is her chest pain. Reviewed predictive value of calcium score and other testing. -counseled on red flag symptoms that warrant immediate medical attention -has PRN SL NG ordered -low suspicion that this is cardiac, no further testing at this time.  Prevention: -recommend heart healthy/Mediterranean diet, with whole grains, fruits, vegetable, fish, lean meats, nuts, and olive oil. Limit salt. -recommend moderate walking, 3-5 times/week for 30-50 minutes each session. Aim for at least 150 minutes.week. Goal should be pace of 3 miles/hours, or walking 1.5 miles in 30 minutes -recommend avoidance of tobacco products. Avoid excess alcohol. -takes 81 mg aspirin daily. Given her GI symptoms and lack of CAD, if this leads to melena or GI upset, ok to stop.  Plan for follow up: 6 mos  TIME SPENT WITH PATIENT: 25 minutes of direct patient care. More than 50% of that time was spent on coordination of care and counseling regarding test results, prognostic value of tests, recommendations.  Buford Dresser, MD, PhD [place]   [facility]   Medication Adjustments/Labs and Tests Ordered: Current medicines are reviewed at length with the patient today.  Concerns regarding medicines are outlined above.  Orders Placed This Encounter  Procedures   LONG TERM MONITOR (3-14  DAYS)   No orders of the defined types were placed in this encounter.   Patient Instructions  Medication Instructions:  Your Physician recommend you continue on your current medication as directed.    If you need a refill on your cardiac medications before your next appointment, please call your pharmacy.   Lab work: None  Testing/Procedures: Our physician has recommended that you wear an [place] monitor. The Zio patch cardiac monitor continuously records heart rhythm data for up to 14 days, this is for patients being evaluated for multiple types heart rhythms. For the first 24 hours post application, please avoid getting the Zio monitor wet in the shower or by excessive sweating during exercise. After that, feel free to carry on with regular activities. Keep soaps and lotions away from the ZIO XT Patch.   This will be placed at our [facility] location - [address].      Follow-Up: At [facility], you and your health needs are our priority.  As part of our continuing mission to provide you with exceptional heart care, we have created designated Provider Care Teams.  These Care Teams include your primary Cardiologist (physician) and Advanced Practice Providers (APPs -  Physician Assistants and Nurse Practitioners) who all work together to provide you with the care you need, when you need it. You will need a follow up appointment in 6 months.  Please call our office 2 months in advance to schedule this appointment.  You may see Buford Dresser, MD or one of the following Advanced Practice Providers on your designated Care Team:   Rosaria Ferries, PA-C  Jory Sims, DNP, ANP       Signed, Buford Dresser, MD PhD 06/04/2019 7:01 PM    [place]

## 2019-06-04 NOTE — Patient Instructions (Signed)
Medication Instructions:  Your Physician recommend you continue on your current medication as directed.    If you need a refill on your cardiac medications before your next appointment, please call your pharmacy.   Lab work: None  Testing/Procedures: Our physician has recommended that you wear an [place] monitor. The Zio patch cardiac monitor continuously records heart rhythm data for up to 14 days, this is for patients being evaluated for multiple types heart rhythms. For the first 24 hours post application, please avoid getting the Zio monitor wet in the shower or by excessive sweating during exercise. After that, feel free to carry on with regular activities. Keep soaps and lotions away from the ZIO XT Patch.   This will be placed at our [facility] location - [address], [address].      Follow-Up: At [facility], you and your health needs are our priority.  As part of our continuing mission to provide you with exceptional heart care, we have created designated Provider Care Teams.  These Care Teams include your primary Cardiologist (physician) and Advanced Practice Providers (APPs -  Physician Assistants and Nurse Practitioners) who all work together to provide you with the care you need, when you need it. You will need a follow up appointment in 6 months.  Please call our office 2 months in advance to schedule this appointment.  You may see Buford Dresser, MD or one of the following Advanced Practice Providers on your designated Care Team:   Rosaria Ferries, PA-C . Jory Sims, DNP, ANP

## 2019-07-12 ENCOUNTER — Telehealth: Payer: Self-pay | Admitting: Cardiology

## 2019-07-12 NOTE — Telephone Encounter (Signed)
Patient called back.  Would like to arrange for Long term monitor to be mailed at this time.  Patient had requested to postpone until after Labor Day. 14 day ZIO XT long term monitor will be mailed to the patients home.  Instructions reviewed briefly as they are included in the monitor kit.

## 2019-07-12 NOTE — Telephone Encounter (Signed)
New Message  Patient is calling in concerning the heart monitor. Please give patient a call back to discuss.

## 2019-07-20 ENCOUNTER — Ambulatory Visit (INDEPENDENT_AMBULATORY_CARE_PROVIDER_SITE_OTHER): Payer: BC Managed Care – PPO

## 2019-07-20 DIAGNOSIS — R000 Tachycardia, unspecified: Secondary | ICD-10-CM | POA: Diagnosis not present

## 2019-09-24 ENCOUNTER — Telehealth: Payer: Self-pay | Admitting: Cardiology

## 2019-09-24 ENCOUNTER — Telehealth: Payer: Self-pay

## 2019-09-24 DIAGNOSIS — G4733 Obstructive sleep apnea (adult) (pediatric): Secondary | ICD-10-CM

## 2019-09-24 NOTE — Telephone Encounter (Signed)
Order placed to [employer] [facility] in [place] for cpap supplies. Patient notified.

## 2019-09-24 NOTE — Telephone Encounter (Signed)
-----   Message from Buford Dresser, MD sent at 09/23/2019 10:20 PM EST ----- Monitor reviewed. All of the triggered events were sinus (either normal sinus rhythm or sinus tachycardia). There were 4 unclear events--two of these were likely brief SVT. These were very brief (13-15 beats each) and not very fast. They are not dangerous. We can continue to monitor her symptoms and discuss at follow up in February, or I am happy to see her sooner in person or virtual to discuss options.

## 2019-09-24 NOTE — Telephone Encounter (Signed)
° ° °  1) What problem are you experiencing? Need supplies  2) Who is your medical equipment company? [employer] in [place]   Please route to the sleep study assistant.

## 2019-09-24 NOTE — Telephone Encounter (Signed)
Called pt to provided monitor report. Pt verbalized understanding but report she has developed worsening SOB and a few episodes of chest discomfort. Pt denies current CP but state she can tell something is going on. Pt denies any other symptoms and report she does have asthma and planned to f/u wit pulmonologist on 12/7.  Appointment scheduled for pt to be seen in office on 11/25 by PA. Pt voiced understanding to report to ED if CP reoccur on SOB worsen.

## 2019-09-26 ENCOUNTER — Other Ambulatory Visit: Payer: Self-pay

## 2019-09-26 ENCOUNTER — Encounter: Payer: Self-pay | Admitting: Cardiology

## 2019-09-26 ENCOUNTER — Ambulatory Visit (INDEPENDENT_AMBULATORY_CARE_PROVIDER_SITE_OTHER): Payer: BC Managed Care – PPO | Admitting: Cardiology

## 2019-09-26 VITALS — BP 100/70 | HR 83 | Temp 97.0°F | Ht 60.0 in | Wt 155.0 lb

## 2019-09-26 DIAGNOSIS — E039 Hypothyroidism, unspecified: Secondary | ICD-10-CM

## 2019-09-26 DIAGNOSIS — K219 Gastro-esophageal reflux disease without esophagitis: Secondary | ICD-10-CM | POA: Insufficient documentation

## 2019-09-26 DIAGNOSIS — K589 Irritable bowel syndrome without diarrhea: Secondary | ICD-10-CM

## 2019-09-26 DIAGNOSIS — K222 Esophageal obstruction: Secondary | ICD-10-CM

## 2019-09-26 DIAGNOSIS — R079 Chest pain, unspecified: Secondary | ICD-10-CM

## 2019-09-26 DIAGNOSIS — Z9989 Dependence on other enabling machines and devices: Secondary | ICD-10-CM

## 2019-09-26 DIAGNOSIS — G4733 Obstructive sleep apnea (adult) (pediatric): Secondary | ICD-10-CM

## 2019-09-26 DIAGNOSIS — R0602 Shortness of breath: Secondary | ICD-10-CM

## 2019-09-26 NOTE — Assessment & Plan Note (Signed)
Unclear etiology- check echo

## 2019-09-26 NOTE — Progress Notes (Signed)
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 She has a hx of noncardiac chest pain.  She had catheterization in 2008, normal coronary CT scan with 0 calcium score in 2013, and a negative Lexiscan Myoview in 2018.  Other problems include treated sleep apnea followed by Dr. Radford Pax, and a prior history of irritable bowel syndrome and GERD with prior esophageal dilatation.  She recently had some palpitations and was given a ZIO monitor.  This revealed sinus rhythm and sinus tachycardia although there were 2 short runs of PSVT that were unrelated to symptoms.  The patient was seen in the office today with complaints of shortness of breath.  She says at times she has "a sinking feeling".  She says she is short of breath at rest and that it gradually progresses during the day. There is no orthopnea or LE edema.  She tells me she has a history of asthma and is using an inhaler but it does not seem to help.  She told she had labs recently with her PCP and these were unremarkable. I explained that we have done several functional studies that have not suggested significant coronary disease and she told me, "those were done years ago, I know there is something wrong".   Past Medical History:  Diagnosis Date  . Asthma   . Asthma   . Chest pain    LHC 7/08: EF 60%, normal coronaries; ETT-Echo 6/11: EF 55-60%, no ischemia  . Chronic back pain   . CTS (carpal tunnel syndrome)    bil  . CTS (carpal tunnel syndrome)   . Hypothyroidism   . IBS (irritable bowel syndrome)   . Internal hemorrhoids   . Migraines   . OSA on CPAP 11/13/2017   Mild with AHI 13/hr   . Pulmonary nodule, right     Past Surgical History:  Procedure Laterality Date  . BREAST EXCISIONAL BIOPSY Right    benign  . breast nodule surg     right breast nodule and left lymph node removed in 1994  . COLONOSCOPY  06/09/2012   Procedure: COLONOSCOPY;  Surgeon: Lafayette Dragon, MD;  Location: [facility];  Service: Endoscopy;  Laterality: N/A;  . EXCISION OF BREAST BIOPSY Left    2 benign lymph nodes removed  . HEMORRHOID SURGERY     2007  . LYMPH NODE BIOPSY     left breast  . NASAL SINUS SURGERY     1992  . TONSILLECTOMY     1981  . TOTAL VAGINAL HYSTERECTOMY      Current Medications: Current Meds  Medication Sig  . ALPRAZolam (XANAX) 0.5 MG tablet Take 0.25 mg by mouth as directed. PRN  . aspirin EC 81 MG tablet Take 1 tablet (81 mg total) by mouth daily.  . Azelastine HCl 0.15 % SOLN Place 1 Squirt into the nose as directed.   . budesonide-formoterol (SYMBICORT) 80-4.5 MCG/ACT inhaler Inhale 1 puff into the lungs 2 (two) times daily as needed.   . citalopram (CELEXA) 20 MG tablet Take 1 tablet by mouth daily.  . cyclobenzaprine (  FLEXERIL) 10 MG tablet TAKE 1 TABLET THREE TIMES A DAY AS NEEDED FOR MUSCLE SPASMS  . DONNATAL 16.2 MG tablet Take 16.2 mg by mouth daily as needed. Stomach pain  . EPINEPHrine 0.3 mg/0.3 mL IJ SOAJ injection as needed.  Marland Kitchen esomeprazole (NEXIUM) 40 MG capsule Take 40 mg by mouth 2 (two) times daily.  Marland Kitchen HYDROcodone-acetaminophen (NORCO/VICODIN) 5-325 MG per tablet Take 1 tablet by mouth daily as needed (pain).   Marland Kitchen ibuprofen (ADVIL,MOTRIN) 800 MG tablet Take 800 mg by mouth every 8 (eight) hours as needed (pain).  . montelukast (SINGULAIR) 10 MG tablet Take 10 mg by mouth at bedtime.  . nitroGLYCERIN (NITROSTAT) 0.4 MG SL tablet Place 1 tablet (0.4 mg total) under the tongue every 5 (five) minutes as needed for chest pain.  . polyethylene glycol powder (GLYCOLAX/MIRALAX) powder Take 17 g by mouth as needed for mild  constipation.   . Prenat-FeFum-Doc-FA-DHA w/o A (NEXA PLUS) 29-1.25-350 MG CAPS Take 1 tablet by mouth Daily.  Marland Kitchen PROAIR HFA 108 (90 BASE) MCG/ACT inhaler Take 2 puffs by mouth 4 (four) times daily. PRN  . SYNTHROID 75 MCG tablet Take 75 mcg by mouth daily.  Marland Kitchen topiramate (TOPAMAX) 50 MG tablet Take 50 mg by mouth every morning. Take 100mg  by mouth at night  . zolpidem (AMBIEN) 10 MG tablet Take 0.5 tablets by mouth at bedtime as needed for sleep.      Allergies:   Gadolinium derivatives, Latex, Fluticasone-salmeterol, Contrast media [iodinated diagnostic agents], and Sulfamethoxazole-trimethoprim   Social History   Socioeconomic History  . Marital status: Married    Spouse name: Doren Custard  . Number of children: 3  . Years of education: 64  . Highest education level: Not on file  Occupational History  . Not on file  Social Needs  . Financial resource strain: Not on file  . Food insecurity    Worry: Not on file    Inability: Not on file  . Transportation needs    Medical: Not on file    Non-medical: Not on file  Tobacco Use  . Smoking status: Never Smoker  . Smokeless tobacco: Never Used  Substance and Sexual Activity  . Alcohol use: No  . Drug use: No  . Sexual activity: Not on file  Lifestyle  . Physical activity    Days per week: Not on file    Minutes per session: Not on file  . Stress: Not on file  Relationships  . Social Herbalist on phone: Not on file    Gets together: Not on file    Attends religious service: Not on file    Active member of club or organization: Not on file    Attends meetings of clubs or organizations: Not on file    Relationship status: Not on file  Other Topics Concern  . Not on file  Social History Narrative   Live at home with husband and son.   Right handed.   Caffeine use: drinks 1 pepsi/day      Family History: The patient's family history includes Breast cancer in her maternal aunt; COPD in her father; Colon cancer in  her maternal aunt, maternal grandmother, and maternal uncle; Heart disease in her maternal grandfather, paternal grandfather, and paternal uncle; Heart failure in her father; Hypertension in her father; Lymphoma in her mother; Mitral valve prolapse in her sister; Ovarian cancer in her maternal aunt; Prostate cancer in her father.  ROS:   Please see the history  of present illness.     All other systems reviewed and are negative.  EKGs/Labs/Other Studies Reviewed:    The following studies were reviewed today: Myoview 2018  EKG:  EKG is ordered today.  The ekg ordered today demonstrates NSR, 82.   Recent Labs: No results found for requested labs within last 8760 hours.  Recent Lipid Panel No results found for: CHOL, TRIG, HDL, CHOLHDL, VLDL, LDLCALC, LDLDIRECT  Physical Exam:    VS:  BP 100/70   Pulse 83   Temp (!) 97 F (36.1 C) (Temporal)   Ht 5' (1.524 m)   Wt 155 lb (70.3 kg)   SpO2 99%   BMI 30.27 kg/m     Wt Readings from Last 3 Encounters:  09/26/19 155 lb (70.3 kg)  06/04/19 152 lb (68.9 kg)  09/08/18 157 lb 6.4 oz (71.4 kg)     GEN:  Overweight female, well developed in no acute distress HEENT: Normal NECK: No JVD; No carotid bruits CARDIAC: RRR, no murmurs, rubs, gallops RESPIRATORY:  Clear to auscultation without rales, wheezing or rhonchi  ABDOMEN:  non-distended MUSCULOSKELETAL:  No edema; No deformity  SKIN: Warm and dry NEUROLOGIC:  Alert and oriented x 3 PSYCHIATRIC:  Normal affect   ASSESSMENT:    SOB Unclear etiology- check echo  Chest pain of uncertain etiology Normal coronaries 2008 Normal coronaries and 0 Ca++ score in 2013 Negative Lexiscan 2018  OSA on CPAP Followed by Dr Radford Pax  Hypothyroidism Recent TSH WNL per patient  PLAN:    Check echo to r/o structural heart disease-f/u with Dr Harrell Gave (virtual OK).    Medication Adjustments/Labs and Tests Ordered: Current medicines are reviewed at length with the patient today.   Concerns regarding medicines are outlined above.  Orders Placed This Encounter  Procedures  . EKG 12-Lead  . ECHOCARDIOGRAM COMPLETE   No orders of the defined types were placed in this encounter.   Patient Instructions  Medication Instructions:  Your physician recommends that you continue on your current medications as directed. Please refer to the Current Medication list given to you today. *If you need a refill on your cardiac medications before your next appointment, please call your pharmacy*  Lab Work: None  If you have labs (blood work) drawn today and your tests are completely normal, you will receive your results only by: Marland Kitchen MyChart Message (if you have MyChart) OR . A paper copy in the mail If you have any lab test that is abnormal or we need to change your treatment, we will call you to review the results.  Testing/Procedures: Your physician has requested that you have an echocardiogram. Echocardiography is a painless test that uses sound waves to create images of your heart. It provides your doctor with information about the size and shape of your heart and how well your heart's chambers and valves are working. This procedure takes approximately one hour. There are no restrictions for this procedure. TEST WILL BE COMPLETED AT [address]  Follow-Up: At [facility], you and your health needs are our priority.  As part of our continuing mission to provide you with exceptional heart care, we have created designated Provider Care Teams.  These Care Teams include your primary Cardiologist (physician) and Advanced Practice Providers (APPs -  Physician Assistants and Nurse Practitioners) who all work together to provide you with the care you need, when you need it.  Your next appointment:   3-4 week(s)  The format for your  next appointment:   Either In Person or Virtual  Provider:   Buford Dresser, MD ONLY  Other Instructions     Signed, Kerin Ransom, PA-C  09/26/2019 3:41 PM    [place]

## 2019-09-26 NOTE — Patient Instructions (Addendum)
Medication Instructions:  Your physician recommends that you continue on your current medications as directed. Please refer to the Current Medication list given to you today. *If you need a refill on your cardiac medications before your next appointment, please call your pharmacy*  Lab Work: None  If you have labs (blood work) drawn today and your tests are completely normal, you will receive your results only by: Marland Kitchen MyChart Message (if you have MyChart) OR . A paper copy in the mail If you have any lab test that is abnormal or we need to change your treatment, we will call you to review the results.  Testing/Procedures: Your physician has requested that you have an echocardiogram. Echocardiography is a painless test that uses sound waves to create images of your heart. It provides your doctor with information about the size and shape of your heart and how well your heart's chambers and valves are working. This procedure takes approximately one hour. There are no restrictions for this procedure. TEST WILL BE COMPLETED AT [address]  Follow-Up: At [facility] you and your health needs are our priority.  As part of our continuing mission to provide you with exceptional heart care, we have created designated Provider Care Teams.  These Care Teams include your primary Cardiologist (physician) and Advanced Practice Providers (APPs -  Physician Assistants and Nurse Practitioners) who all work together to provide you with the care you need, when you need it.  Your next appointment:   3-4 week(s)  The format for your next appointment:   Either In Person or Virtual  Provider:   Buford Dresser, MD ONLY  Other Instructions

## 2019-09-26 NOTE — Assessment & Plan Note (Signed)
Recent TSH WNL per patient

## 2019-09-26 NOTE — Assessment & Plan Note (Signed)
Normal coronaries 2008 Normal coronaries and 0 Ca++ score in 2013 Negative Lexiscan 2018

## 2019-09-26 NOTE — Assessment & Plan Note (Signed)
Followed by Dr Radford Pax

## 2019-10-08 ENCOUNTER — Other Ambulatory Visit: Payer: Self-pay

## 2019-10-08 ENCOUNTER — Encounter: Payer: Self-pay | Admitting: Internal Medicine

## 2019-10-08 ENCOUNTER — Ambulatory Visit (INDEPENDENT_AMBULATORY_CARE_PROVIDER_SITE_OTHER): Payer: BC Managed Care – PPO | Admitting: Internal Medicine

## 2019-10-08 VITALS — BP 116/70 | HR 91 | Ht 60.0 in | Wt 155.2 lb

## 2019-10-08 DIAGNOSIS — Z8709 Personal history of other diseases of the respiratory system: Secondary | ICD-10-CM

## 2019-10-08 DIAGNOSIS — R911 Solitary pulmonary nodule: Secondary | ICD-10-CM | POA: Diagnosis not present

## 2019-10-08 DIAGNOSIS — R0602 Shortness of breath: Secondary | ICD-10-CM

## 2019-10-08 DIAGNOSIS — R053 Chronic cough: Secondary | ICD-10-CM

## 2019-10-08 DIAGNOSIS — R05 Cough: Secondary | ICD-10-CM | POA: Diagnosis not present

## 2019-10-08 LAB — BASIC METABOLIC PANEL
BUN: 14 mg/dL (ref 6–23)
CO2: 24 mEq/L (ref 19–32)
Calcium: 9.4 mg/dL (ref 8.4–10.5)
Chloride: 108 mEq/L (ref 96–112)
Creatinine, Ser: 0.77 mg/dL (ref 0.40–1.20)
GFR: 76.79 mL/min (ref >60.00)
Glucose, Bld: 90 mg/dL (ref 70–99)
Potassium: 3.7 mEq/L (ref 3.5–5.1)
Sodium: 139 mEq/L (ref 135–145)

## 2019-10-08 LAB — CBC WITH DIFFERENTIAL/PLATELET
Basophils Absolute: 0.1 10*3/uL (ref 0.0–0.1)
Basophils Relative: 1.4 % (ref 0.0–3.0)
Eosinophils Absolute: 0.4 10*3/uL (ref 0.0–0.7)
Eosinophils Relative: 4.2 % (ref 0.0–5.0)
HCT: 37.5 % (ref 36.0–46.0)
Hemoglobin: 12.8 g/dL (ref 12.0–15.0)
Lymphocytes Relative: 24 % (ref 12.0–46.0)
Lymphs Abs: 2 10*3/uL (ref 0.7–4.0)
MCHC: 34 g/dL (ref 30.0–36.0)
MCV: 91 fl (ref 78.0–100.0)
Monocytes Absolute: 0.5 10*3/uL (ref 0.1–1.0)
Monocytes Relative: 6.3 % (ref 3.0–12.0)
Neutro Abs: 5.5 10*3/uL (ref 1.4–7.7)
Neutrophils Relative %: 64.1 % (ref 43.0–77.0)
Platelets: 279 10*3/uL (ref 150.0–400.0)
RBC: 4.12 Mil/uL (ref 3.87–5.11)
RDW: 12.3 % (ref 11.5–15.5)
WBC: 8.5 10*3/uL (ref 4.0–10.5)

## 2019-10-08 NOTE — Patient Instructions (Addendum)
ICD-10-CM   1. Shortness of breath  R06.02   2. Chronic cough  R05   3. History of asthma  Z87.09   4. Pulmonary nodule, right  R91.1     Do walk test on room air in office Do cbc with diff, bmet, blood allergy profile, IgE blood work 10/08/2019 Do HRCT supine and prone Do full PFT Complete planned echo  Followup   - in few to several weeks but after above - tests can be done at [facility]

## 2019-10-08 NOTE — Progress Notes (Signed)
Subjective:    Patient ID: Catherine Hancock, female    DOB: 1961/08/12, 58 y.o.   MRN: [id number]  PCP Tobe Sos, MD   HPI   IOV 10/08/2019  Chief Complaint  Patient presents with  . Consult    Self referral due to asthma. Pt stated she had the flu 2 years ago and since then she has been having problems with asthma, cough, SOB. Pt states the SOB can happen at any time.   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  Self-referred for shortness of breath and cough.  She tells me that she has had a lifelong history of asthma for which she is on Symbicort and Singulair on a scheduled basis.  Then in 2018 Easter she had influenza treated with Tamiflu as an outpatient in [facility] after family cluster.  Since then she has had cough that has persisted all along.  The cough is mild and persistent.  Never really improved.  Then in early 2020 she developed insidious onset of shortness of breath that has progressed.  She tells me she can get short of breath anytime.  At night she can wake up occasionally because of the cough but never because of shortness of breath.  There is no orthopnea proximal nocturnal dyspnea but in the daytime she perceives dyspnea for anything and randomly.  She says that when she walks definitely the shortness of breath is worse and relieved by rest there is no associated chest pain.  She has an upcoming echocardiogram.  Review of the outside notes indicate she has had cardiology work-up that has been negative.  Overall the shortness of breath is rated currently as moderate with the cough as mild and the wheezing is extremely mild.  Walking desaturation test today on room air shows pulse ox 100% on room air with a heart rate of 88/min.  After walking 3 laps her pulse ox was 98% with a heart rate of 107/min.  She walked at average pace and she was mildly dyspneic with this.  Review of the  chart indicates a history of right lower lobe lung nodule.  However there is no imaging for me to visualize.  She said rheumatoid factor test for unclear reasons for years ago and was normal  She has a history of allergies.  She says she has had a skin test many many years ago and this was positive for oak.  Otherwise details not known.  She is willing to have a full work-up at this point in time.   In terms of asthma control questionnaire she says she is waking up several times at night because of asthma.  When she wakes up she has moderate symptoms she is moderately limited in activities.  She is experiencing shortness of breath quite a lot but she is wheezing hardly any of the time.  Overall score average score would be 2.8 showing significant level of symptoms.    Results for Catherine Hancock, Catherine Hancock (MRN [id number]) as of 10/08/2019 11:26  Ref. Range 03/12/2015 11:59  Anti Nuclear Antibody (ANA) Latest Ref Range: Negative  Negative  RA Latex Turbid. Latest Ref Range: 0.0 - 13.9 IU/mL 9.3   Results for Catherine Hancock, Catherine Hancock (MRN [id number]) as of 10/08/2019 11:26  Ref. Range 01/13/2015 14:30 03/12/2015 11:59  CO2 Latest Ref Range: 18 - 29 mmol/L 19 22    has a past medical history of  Asthma, Asthma, Chest pain, Chronic back pain, CTS (carpal tunnel syndrome), CTS (carpal tunnel syndrome), Hypothyroidism, IBS (irritable bowel syndrome), Internal hemorrhoids, Migraines, OSA on CPAP (11/13/2017), and Pulmonary nodule, right.   reports that she has never smoked. She has never used smokeless tobacco.  Past Surgical History:  Procedure Laterality Date  . BREAST EXCISIONAL BIOPSY Right    benign  . breast nodule surg     right breast nodule and left lymph node removed in 1994  . COLONOSCOPY  06/09/2012   Procedure: COLONOSCOPY;  Surgeon: Lafayette Dragon, MD;  Location: [facility];  Service: Endoscopy;  Laterality: N/A;  . EXCISION OF BREAST BIOPSY Left    2 benign lymph nodes removed  . HEMORRHOID SURGERY      2007  . LYMPH NODE BIOPSY     left breast  . NASAL SINUS SURGERY     1992  . TONSILLECTOMY     1981  . TOTAL VAGINAL HYSTERECTOMY      Allergies  Allergen Reactions  . Gadolinium Derivatives Other (See Comments)    Severe pain, joints locked up  . Latex Other (See Comments) and Shortness Of Breath    Asthma attack   . Fluticasone-Salmeterol Other (See Comments)    Blurry vision Blurry vision  . Contrast Media [Iodinated Diagnostic Agents]     gadolinium contrast-causes joints to lock up, severe pain  . Sulfamethoxazole-Trimethoprim Diarrhea and Nausea And Vomiting    REACTION: GI upset Other reaction(s): OTHER    Immunization History  Administered Date(s) Administered  . Influenza Whole 07/25/2007  . Influenza,inj,Quad PF,6+ Mos 08/13/2016, 09/12/2019  . Influenza-Unspecified 08/01/2017  . Tdap 08/13/2016    Family History  Problem Relation Age of Onset  . Colon cancer Maternal Aunt        x 4  . Colon cancer Maternal Uncle   . Colon cancer Maternal Grandmother   . Heart disease Maternal Grandfather   . Breast cancer Maternal Aunt   . Ovarian cancer Maternal Aunt   . Lymphoma Mother   . Prostate cancer Father   . COPD Father   . Heart failure Father   . Hypertension Father   . Heart disease Paternal Uncle        x 4  . Heart disease Paternal Grandfather   . Mitral valve prolapse Sister      Current Outpatient Medications:  .  ALPRAZolam (XANAX) 0.5 MG tablet, Take 0.25 mg by mouth as directed. PRN, Disp: , Rfl:  .  aspirin EC 81 MG tablet, Take 1 tablet (81 mg total) by mouth daily., Disp: , Rfl:  .  Azelastine HCl 0.15 % SOLN, Place 1 Squirt into the nose as directed. , Disp: , Rfl:  .  budesonide-formoterol (SYMBICORT) 80-4.5 MCG/ACT inhaler, Inhale 1 puff into the lungs 2 (two) times daily as needed. , Disp: , Rfl:  .  Cholecalciferol (VITAMIN D3 PO), Take 25,000 Units by mouth once a week., Disp: , Rfl:  .  citalopram (CELEXA) 20 MG tablet, Take 1  tablet by mouth daily., Disp: , Rfl:  .  cyclobenzaprine (FLEXERIL) 10 MG tablet, TAKE 1 TABLET THREE TIMES A DAY AS NEEDED FOR MUSCLE SPASMS, Disp: 90 tablet, Rfl: 0 .  DONNATAL 16.2 MG tablet, Take 16.2 mg by mouth daily as needed. Stomach pain, Disp: , Rfl:  .  EPINEPHrine 0.3 mg/0.3 mL IJ SOAJ injection, as needed., Disp: , Rfl:  .  esomeprazole (NEXIUM) 40 MG capsule, Take by mouth., Disp: , Rfl:  .  HYDROcodone-acetaminophen (NORCO/VICODIN) 5-325 MG per tablet, Take 1 tablet by mouth daily as needed (pain). , Disp: , Rfl:  .  ibuprofen (ADVIL,MOTRIN) 800 MG tablet, Take 800 mg by mouth every 8 (eight) hours as needed (pain)., Disp: , Rfl:  .  lubiprostone (AMITIZA) 24 MCG capsule, Take by mouth., Disp: , Rfl:  .  montelukast (SINGULAIR) 10 MG tablet, Take 10 mg by mouth at bedtime., Disp: , Rfl:  .  polyethylene glycol powder (GLYCOLAX/MIRALAX) powder, Take 17 g by mouth as needed for mild constipation. , Disp: , Rfl:  .  Prenat-FeFum-Doc-FA-DHA w/o A (NEXA PLUS) 29-1.25-350 MG CAPS, Take 1 tablet by mouth Daily., Disp: , Rfl:  .  PROAIR HFA 108 (90 BASE) MCG/ACT inhaler, Take 2 puffs by mouth 4 (four) times daily. PRN, Disp: , Rfl:  .  SYNTHROID 75 MCG tablet, Take 75 mcg by mouth daily., Disp: , Rfl:  .  topiramate (TOPAMAX) 50 MG tablet, Take 50 mg by mouth every morning. Take 100mg  by mouth at night, Disp: , Rfl:  .  zolpidem (AMBIEN) 10 MG tablet, Take 0.5 tablets by mouth at bedtime as needed for sleep. , Disp: , Rfl:  .  esomeprazole (NEXIUM) 40 MG capsule, Take 40 mg by mouth 2 (two) times daily., Disp: , Rfl:  .  nitroGLYCERIN (NITROSTAT) 0.4 MG SL tablet, Place 1 tablet (0.4 mg total) under the tongue every 5 (five) minutes as needed for chest pain., Disp: 25 tablet, Rfl: 12    Review of Systems  Constitutional: Positive for unexpected weight change. Negative for fever.  HENT: Positive for congestion, ear pain, sore throat and trouble swallowing. Negative for dental problem,  nosebleeds, postnasal drip, rhinorrhea, sinus pressure and sneezing.   Eyes: Negative for redness and itching.  Respiratory: Positive for cough, chest tightness and shortness of breath. Negative for wheezing.   Cardiovascular: Positive for palpitations. Negative for leg swelling.  Gastrointestinal: Positive for nausea. Negative for vomiting.  Genitourinary: Negative for dysuria.  Musculoskeletal: Negative for joint swelling.  Skin: Negative for rash.  Allergic/Immunologic: Negative.  Negative for environmental allergies, food allergies and immunocompromised state.  Neurological: Positive for headaches.  Hematological: Bruises/bleeds easily.  Psychiatric/Behavioral: Negative for dysphoric mood. The patient is nervous/anxious.        Objective:   Physical Exam  Vitals:   10/08/19 1111  BP: 116/70  Pulse: 91  SpO2: 98%  Weight: 155 lb 3.2 oz (70.4 kg)  Height: 5' (1.524 m)     Estimated body mass index is 30.31 kg/m as calculated from the following:   Height as of this encounter: 5' (1.524 m).   Weight as of this encounter: 155 lb 3.2 oz (70.4 kg).   General Appearance:    Alert, cooperative, no distress, appears stated age - yes , sitting on - chair  Head:    Normocephalic, without obvious abnormality, atraumatic  Eyes:    PERRL, conjunctiva/corneas clear,  Ears:    Normal TM's and external ear canals, both ears  Nose:   Nares normal, septum midline, mucosa normal, no drainage    or sinus tenderness. OXYGEN ON no @ ra  Throat:   Lips, mucosa, and tongue normal; teeth and gums normal. Cyanosis on lips - no  Neck:   Supple, symmetrical, trachea midline, no adenopathy;    thyroid:  no enlargement/tenderness/nodules; no carotid   bruit or JVD  Back:     Symmetric, no curvature, ROM normal, no CVA tenderness  Lungs:     Distress -  no , Wheeze no, Barrell Chest - no, Purse lip breathing - no, Crackles - no   Chest Wall:    No tenderness or deformity. 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 Shortness of breath  R06.02   2. Chronic cough  R05   3. History of asthma  Z87.09   4. Pulmonary nodule, right  R91.1     Reasons for shortness of breath and cough and wheezing unclear.  She is on optimal therapy for asthma.  This could be irritable larynx syndrome or complicated asthma such as allergic asthma or ABPA but clinically none of this is present.  She could have interstitial lung disease but clinically I could not elicit this.  This could also just be deconditioning and diastolic dysfunction which is quite possible given her BMI and age.  She is agreed for a full work-up.  Patient Instructions     ICD-10-CM   1. Shortness of breath  R06.02   2. Chronic cough  R05   3. History of asthma  Z87.09   4. Pulmonary nodule, right  R91.1     Do walk test on room air in office Do cbc with diff, bmet, blood allergy profile, IgE blood work 10/08/2019 Do HRCT supine and prone Do full PFT  Followup   - in few to several weeks but after above       SIGNATURE    Dr. Brand Males, M.D., F.C.C.P,  Pulmonary and Critical Care Medicine Staff Physician, [place] Director - Interstitial Lung Disease  Program  P[facility] [place] at [place], [place], [address]  Pager: [phone], If  no answer or between  15:00h - 7:00h: call [phone] Telephone: [phone]  11:49 AM 10/08/2019

## 2019-10-08 NOTE — Addendum Note (Signed)
Addended by: Suzzanne Cloud E on: 10/08/2019 12:01 PM   Modules accepted: Orders

## 2019-10-11 LAB — RESPIRATORY ALLERGY PROFILE REGION II ~~LOC~~

## 2019-10-11 LAB — INTERPRETATION:

## 2019-10-12 ENCOUNTER — Other Ambulatory Visit (HOSPITAL_COMMUNITY): Payer: BC Managed Care – PPO

## 2019-10-15 ENCOUNTER — Ambulatory Visit: Payer: BC Managed Care – PPO | Admitting: Cardiology

## 2019-10-30 ENCOUNTER — Ambulatory Visit (HOSPITAL_COMMUNITY)
Admission: RE | Admit: 2019-10-30 | Discharge: 2019-10-30 | Disposition: A | Discharge status: Home / Self Care | Payer: BC Managed Care – PPO | Source: Ambulatory Visit | Attending: Internal Medicine | Admitting: Internal Medicine

## 2019-10-30 ENCOUNTER — Telehealth: Payer: Self-pay | Admitting: *Deleted

## 2019-10-30 ENCOUNTER — Other Ambulatory Visit: Payer: Self-pay

## 2019-10-30 DIAGNOSIS — R0602 Shortness of breath: Secondary | ICD-10-CM

## 2019-10-30 NOTE — Telephone Encounter (Signed)

## 2019-11-20 NOTE — Progress Notes (Signed)
Virtual Visit via Telephone Note   This visit type was conducted due to national recommendations for restrictions regarding the COVID-19 Pandemic (e.g. social distancing) in an effort to limit this patient's exposure and mitigate transmission in our community.  Due to her co-morbid illnesses, this patient is at least at moderate risk for complications without adequate follow up.  This format is felt to be most appropriate for this patient at this time.  All issues noted in this document were discussed and addressed.  A limited physical exam was performed with this format.  Please refer to the patient's chart for her consent to telehealth for [facility].   Evaluation Performed:  Follow-up visit  This visit type was conducted due to national recommendations for restrictions regarding the COVID-19 Pandemic (e.g. social distancing).  This format is felt to be most appropriate for this patient at this time.  All issues noted in this document were discussed and addressed.  No physical exam was performed (except for noted visual exam findings with Video Visits).  Please refer to the patient's chart (MyChart message for video visits and phone note for telephone visits) for the patient's consent to telehealth for [facility].  Date:  11/21/2019   ID:  Catherine Hancock, DOB 1961-03-21, MRN [id number]  Patient Location:  Home  Provider location:   [place]  PCP:  Tobe Sos, MD  Cardiologist:  Buford Dresser, MD  Sleep Medicine:  Fransico Him, MD Electrophysiologist:  None   Chief Complaint:  OSA  History of Present Illness:    Catherine Hancock is a 59 y.o. female who presents via audio/video conferencing for a telehealth visit today.    Catherine Hancock is a 59 y.o. female with a hx of asthma and noncardiac chest pain who was referred for sleep study by Dr. Saunders Revel.  She apparently was complaining of significant snoring and her Epworth Sleepiness Scale was 13.  Was found to have  mild obstructive sleep apnea with an AHI 13.1 and underwent CPAP titration to 13 cm H2O.  She is doing well with her CPAP device but recently has not been using it much as she has been getting implants in her mouth.  She tolerates the nasal mask but does fight with it some.  She feels the pressure is adequate.  She does have problems with daytime fatigue but for the most part sleeps well at night.  She is seeing Cards for CP and has an echo pending and also seeing pulmonary for her asthma that is acting up bad.  She has chronic problems with dry mouth even before CPAP.  She does not think that he snores.    The patient does not have symptoms concerning for COVID-19 infection (fever, chills, cough, or new shortness of breath).    Prior CV studies:   The following studies were reviewed today:  PAP compliance download  Past Medical History:  Diagnosis Date  . Asthma   . Asthma   . Chest pain    LHC 7/08: EF 60%, normal coronaries; ETT-Echo 6/11: EF 55-60%, no ischemia  . Chronic back pain   . CTS (carpal tunnel syndrome)    bil  . CTS (carpal tunnel syndrome)   . Hypothyroidism   . IBS (irritable bowel syndrome)   . Internal hemorrhoids   . Migraines   . OSA on CPAP 11/13/2017   Mild with AHI 13/hr   . Pulmonary nodule, right    Past Surgical History:  Procedure Laterality  Date  . BREAST EXCISIONAL BIOPSY Right    benign  . breast nodule surg     right breast nodule and left lymph node removed in 1994  . COLONOSCOPY  06/09/2012   Procedure: COLONOSCOPY;  Surgeon: Lafayette Dragon, MD;  Location: [facility];  Service: Endoscopy;  Laterality: N/A;  . EXCISION OF BREAST BIOPSY Left    2 benign lymph nodes removed  . HEMORRHOID SURGERY     2007  . LYMPH NODE BIOPSY     left breast  . NASAL SINUS SURGERY     1992  . TONSILLECTOMY     1981  . TOTAL VAGINAL HYSTERECTOMY       Current Meds  Medication Sig  . ALPRAZolam (XANAX) 0.5 MG tablet Take 0.25 mg by mouth as directed. PRN   . aspirin EC 81 MG tablet Take 1 tablet (81 mg total) by mouth daily.  Marland Kitchen BIOTIN PO Take 1 tablet by mouth as directed.  . budesonide-formoterol (SYMBICORT) 80-4.5 MCG/ACT inhaler Inhale 1 puff into the lungs 2 (two) times daily as needed.   Marland Kitchen CALCIUM PO Take 1 tablet by mouth as directed.  . Cholecalciferol (VITAMIN D3 PO) Take 25,000 Units by mouth once a week.  . citalopram (CELEXA) 20 MG tablet Take 1 tablet by mouth daily.  . cyclobenzaprine (FLEXERIL) 10 MG tablet TAKE 1 TABLET THREE TIMES A DAY AS NEEDED FOR MUSCLE SPASMS  . DONNATAL 16.2 MG tablet Take 16.2 mg by mouth daily as needed. Stomach pain  . EPINEPHrine 0.3 mg/0.3 mL IJ SOAJ injection as needed.  Marland Kitchen esomeprazole (NEXIUM) 40 MG capsule Take 40 mg by mouth 2 (two) times daily before a meal.   . HYDROcodone-acetaminophen (NORCO/VICODIN) 5-325 MG per tablet Take 1 tablet by mouth daily as needed (pain).   Marland Kitchen ibuprofen (ADVIL,MOTRIN) 800 MG tablet Take 800 mg by mouth every 8 (eight) hours as needed (pain).  . lubiprostone (AMITIZA) 24 MCG capsule Take 24 mcg by mouth 2 (two) times daily with a meal.   . montelukast (SINGULAIR) 10 MG tablet Take 10 mg by mouth at bedtime.  . nitroGLYCERIN (NITROSTAT) 0.4 MG SL tablet Place 1 tablet (0.4 mg total) under the tongue every 5 (five) minutes as needed for chest pain.  . polyethylene glycol powder (GLYCOLAX/MIRALAX) powder Take 17 g by mouth as needed for mild constipation.   . Prenat-FeFum-Doc-FA-DHA w/o A (NEXA PLUS) 29-1.25-350 MG CAPS Take 1 tablet by mouth Daily.  . Prenatal 27-1 MG TABS   . PROAIR HFA 108 (90 BASE) MCG/ACT inhaler Take 2 puffs by mouth 4 (four) times daily. PRN  . SYNTHROID 75 MCG tablet Take 75 mcg by mouth daily.  Marland Kitchen topiramate (TOPAMAX) 50 MG tablet Take 50 mg by mouth every morning. Take 100mg  by mouth at night  . zinc gluconate 50 MG tablet Take 50 mg by mouth daily.  Marland Kitchen zolpidem (AMBIEN) 10 MG tablet Take 0.5 tablets by mouth at bedtime as needed for sleep.        Allergies:   Gadolinium derivatives, Isosorbide nitrate, Latex, Other, Fluticasone-salmeterol, Contrast media [iodinated diagnostic agents], and Sulfamethoxazole-trimethoprim   Social History   Tobacco Use  . Smoking status: Never Smoker  . Smokeless tobacco: Never Used  Substance Use Topics  . Alcohol use: No  . Drug use: No     Family Hx: The patient's family history includes Breast cancer in her maternal aunt; COPD in her father; Colon cancer in her maternal aunt, maternal grandmother, and maternal  uncle; Heart disease in her maternal grandfather, paternal grandfather, and paternal uncle; Heart failure in her father; Hypertension in her father; Lymphoma in her mother; Mitral valve prolapse in her sister; Ovarian cancer in her maternal aunt; Prostate cancer in her father.  ROS:   Please see the history of present illness.    = All other systems reviewed and are negative.   Labs/Other Tests and Data Reviewed:    Recent Labs: 10/08/2019: BUN 14; Creatinine, Ser 0.77; Hemoglobin 12.8; Platelets 279.0; Potassium 3.7; Sodium 139   Recent Lipid Panel No results found for: CHOL, TRIG, HDL, CHOLHDL, LDLCALC, LDLDIRECT  Wt Readings from Last 3 Encounters:  11/21/19 146 lb (66.2 kg)  10/08/19 155 lb 3.2 oz (70.4 kg)  09/26/19 155 lb (70.3 kg)     Objective:    Vital Signs:  BP 128/65   Pulse 86   Ht 5' (1.524 m)   Wt 146 lb (66.2 kg)   BMI 28.51 kg/m     ASSESSMENT & PLAN:    1.  OSA - The patient is tolerating PAP therapy well without any problems. The PAP download was reviewed today and showed an AHI of 4.1/hr on 8 cm H2O with 3% compliance in using more than 4 hours nightly.  The patient has been using and benefiting from PAP use and will continue to benefit from therapy. Her compliance is reduced because she has had major dental work done over the past few months. Her dental work is almost complete and she will plan to start using it again.    COVID-19 Education: The  signs and symptoms of COVID-19 were discussed with the patient and how to seek care for testing (follow up with PCP or arrange E-visit).  The importance of social distancing was discussed today.  Patient Risk:   After full review of this patient's clinical status, I feel that they are at least moderate risk at this time.  Time:   Today, I have spent 15 minutes directly with the patient on telemedicine discussing medical problems including OSA.  We also reviewed the symptoms of COVID 19 and the ways to protect against contracting the virus with telehealth technology.  I spent an additional 5 minutes reviewing patient's chart including PAP compliance download.  Medication Adjustments/Labs and Tests Ordered: Current medicines are reviewed at length with the patient today.  Concerns regarding medicines are outlined above.  Tests Ordered: No orders of the defined types were placed in this encounter.  Medication Changes: No orders of the defined types were placed in this encounter.   Disposition:  Follow up in 1 year(s)  Signed, Fransico Him, MD  11/21/2019 1:43 PM    [facility]

## 2019-11-21 ENCOUNTER — Telehealth: Payer: Self-pay | Admitting: *Deleted

## 2019-11-21 ENCOUNTER — Encounter: Payer: Self-pay | Admitting: Cardiology

## 2019-11-21 ENCOUNTER — Telehealth (INDEPENDENT_AMBULATORY_CARE_PROVIDER_SITE_OTHER): Payer: BC Managed Care – PPO | Admitting: Cardiology

## 2019-11-21 ENCOUNTER — Other Ambulatory Visit: Payer: Self-pay

## 2019-11-21 VITALS — BP 128/65 | HR 86 | Ht 60.0 in | Wt 146.0 lb

## 2019-11-21 DIAGNOSIS — G4733 Obstructive sleep apnea (adult) (pediatric): Secondary | ICD-10-CM | POA: Diagnosis not present

## 2019-11-21 DIAGNOSIS — Z9989 Dependence on other enabling machines and devices: Secondary | ICD-10-CM

## 2019-11-21 NOTE — Patient Instructions (Signed)
Medication Instructions:  Your physician recommends that you continue on your current medications as directed. Please refer to the Current Medication list given to you today.  *If you need a refill on your cardiac medications before your next appointment, please call your pharmacy*  Follow-Up: At [facility], you and your health needs are our priority.  As part of our continuing mission to provide you with exceptional heart care, we have created designated Provider Care Teams.  These Care Teams include your primary Cardiologist (physician) and Advanced Practice Providers (APPs -  Physician Assistants and Nurse Practitioners) who all work together to provide you with the care you need, when you need it.  Your next appointment:   1 year(s)  The format for your next appointment:   Either In Person or Virtual  Provider:   You may see Buford Dresser, MD or one of the following Advanced Practice Providers on your designated Care Team:    Melina Copa, PA-C  Ermalinda Barrios, PA-C

## 2019-11-21 NOTE — Telephone Encounter (Signed)
[name] - PER Dr Eugene GarnetGae Bon please make sure that patient had a full face mask called in - I recommend the Respironics dreamware under the nose mask  order placed to [facility] via fax [phone].

## 2019-12-10 ENCOUNTER — Ambulatory Visit (INDEPENDENT_AMBULATORY_CARE_PROVIDER_SITE_OTHER): Payer: BC Managed Care – PPO | Admitting: Cardiology

## 2019-12-10 ENCOUNTER — Other Ambulatory Visit: Payer: Self-pay

## 2019-12-10 ENCOUNTER — Encounter: Payer: Self-pay | Admitting: Cardiology

## 2019-12-10 VITALS — BP 134/68 | HR 68 | Ht 60.0 in | Wt 152.4 lb

## 2019-12-10 DIAGNOSIS — Z7189 Other specified counseling: Secondary | ICD-10-CM | POA: Diagnosis not present

## 2019-12-10 DIAGNOSIS — R0602 Shortness of breath: Secondary | ICD-10-CM | POA: Diagnosis not present

## 2019-12-10 DIAGNOSIS — R0789 Other chest pain: Secondary | ICD-10-CM | POA: Diagnosis not present

## 2019-12-10 NOTE — Progress Notes (Signed)
Cardiology Office Note:    Date:  12/10/2019   ID:  [name], DOB 1961/08/19, MRN [id number]  PCP:  Tobe Sos, MD  Cardiologist:  Buford Dresser, MD PhD (previously Dr. Saunders Revel)  Referring MD: Tobe Sos, MD   CC: Follow up  History of Present Illness:    Catherine Hancock is a 59 y.o. female with a hx of atypical chest pain, irritable bowel syndrome, hypothyroidism, OSA on CPAP who is seen for follow up today.  Today: Has had a cold, not using CPAP device like she should. Husband had Covid in early December, but she isolated from him. She was negative for Covid twice on checks.   Seen by Kerin Ransom 09/26/19 for chest pain and shortness of breath. Feels breathing is improved but not at her baseline. Couldn't schedule echo because husband was ill.  Rare chest pain, about 1-2 times/week at most. Longest was 10-15 minutes. Nonexertional, not related to food or other clear triggers. Pain is like a pressure, always in the center of her chest. Nothing makes it better or worse.   Reviewed recent CT noncon. No coronary calcium, did have questionable bronchitis and hiatal hernia. Hasn't needed an EGD/dilation recently but has noticed issues with swallowing again, plans to follow up with GI.   Denies PND, orthopnea, LE edema or unexpected weight gain. No syncope or palpitations. ROS positive for recent headache for the last several days after straining herself watching her granddaughters. Also positive for chronic fatigue, baseline, nonlimiting.  Past Medical History:  Diagnosis Date  . Asthma   . Asthma   . Chest pain    LHC 7/08: EF 60%, normal coronaries; ETT-Echo 6/11: EF 55-60%, no ischemia  . Chronic back pain   . CTS (carpal tunnel syndrome)    bil  . CTS (carpal tunnel syndrome)   . Hypothyroidism   . IBS (irritable bowel syndrome)   . Internal hemorrhoids   . Migraines   . OSA on CPAP 11/13/2017   Mild with AHI 13/hr   . Pulmonary nodule, right      Past Surgical History:  Procedure Laterality Date  . BREAST EXCISIONAL BIOPSY Right    benign  . breast nodule surg     right breast nodule and left lymph node removed in 1994  . COLONOSCOPY  06/09/2012   Procedure: COLONOSCOPY;  Surgeon: Lafayette Dragon, MD;  Location: [facility];  Service: Endoscopy;  Laterality: N/A;  . EXCISION OF BREAST BIOPSY Left    2 benign lymph nodes removed  . HEMORRHOID SURGERY     2007  . LYMPH NODE BIOPSY     left breast  . NASAL SINUS SURGERY     1992  . TONSILLECTOMY     1981  . TOTAL VAGINAL HYSTERECTOMY      Current Medications: Current Outpatient Medications on File Prior to Visit  Medication Sig  . ALPRAZolam (XANAX) 0.5 MG tablet Take 0.25 mg by mouth as directed. PRN  . aspirin EC 81 MG tablet Take 1 tablet (81 mg total) by mouth daily.  Marland Kitchen BIOTIN PO Take 1 tablet by mouth as directed.  . budesonide-formoterol (SYMBICORT) 80-4.5 MCG/ACT inhaler Inhale 1 puff into the lungs 2 (two) times daily as needed.   Marland Kitchen CALCIUM PO Take 1 tablet by mouth as directed.  . Cholecalciferol (VITAMIN D3 PO) Take 25,000 Units by mouth once a week.  . citalopram (CELEXA) 20 MG tablet Take 1 tablet by mouth daily.  . cyclobenzaprine (  FLEXERIL) 10 MG tablet TAKE 1 TABLET THREE TIMES A DAY AS NEEDED FOR MUSCLE SPASMS  . DONNATAL 16.2 MG tablet Take 16.2 mg by mouth daily as needed. Stomach pain  . EPINEPHrine 0.3 mg/0.3 mL IJ SOAJ injection as needed.  Marland Kitchen esomeprazole (NEXIUM) 40 MG capsule Take 40 mg by mouth 2 (two) times daily before a meal.   . ibuprofen (ADVIL,MOTRIN) 800 MG tablet Take 800 mg by mouth every 8 (eight) hours as needed (pain).  . lubiprostone (AMITIZA) 24 MCG capsule Take 24 mcg by mouth 2 (two) times daily with a meal.   . montelukast (SINGULAIR) 10 MG tablet Take 10 mg by mouth at bedtime.  . polyethylene glycol powder (GLYCOLAX/MIRALAX) powder Take 17 g by mouth as needed for mild constipation.   . Prenat-FeFum-Doc-FA-DHA w/o A (NEXA PLUS)  29-1.25-350 MG CAPS Take 1 tablet by mouth Daily.  . Prenatal 27-1 MG TABS   . PROAIR HFA 108 (90 BASE) MCG/ACT inhaler Take 2 puffs by mouth 4 (four) times daily. PRN  . SYNTHROID 75 MCG tablet Take 75 mcg by mouth daily.  Marland Kitchen topiramate (TOPAMAX) 50 MG tablet Take 50 mg by mouth every morning. Take 100mg  by mouth at night  . zinc gluconate 50 MG tablet Take 50 mg by mouth daily.  Marland Kitchen zolpidem (AMBIEN) 10 MG tablet Take 0.5 tablets by mouth at bedtime as needed for sleep.   . nitroGLYCERIN (NITROSTAT) 0.4 MG SL tablet Place 1 tablet (0.4 mg total) under the tongue every 5 (five) minutes as needed for chest pain.   No current facility-administered medications on file prior to visit.     Allergies:   Gadolinium derivatives, Isosorbide nitrate, Latex, Other, Fluticasone-salmeterol, Contrast media [iodinated diagnostic agents], and Sulfamethoxazole-trimethoprim   Social History   Tobacco Use  . Smoking status: Never Smoker  . Smokeless tobacco: Never Used  Substance Use Topics  . Alcohol use: No  . Drug use: No    Family History: The patient's family history includes Breast cancer in her maternal aunt; COPD in her father; Colon cancer in her maternal aunt, maternal grandmother, and maternal uncle; Heart disease in her maternal grandfather, paternal grandfather, and paternal uncle; Heart failure in her father; Hypertension in her father; Lymphoma in her mother; Mitral valve prolapse in her sister; Ovarian cancer in her maternal aunt; Prostate cancer in her father.  ROS:   Please see the history of present illness.  Additional pertinent ROS negative except as documented.  EKGs/Labs/Other Studies Reviewed:    The following studies were reviewed today: Cath/PCI:  LHC (02/01/07): LMCA normal. LAD with 2 large diagonal branches, normal. Dominant LCx without significant disease. Nondominant RCA without significant disease. LVEF 60% without wall motion abnormalities.  CV Surgery:  None  EP  Procedures and Devices:  None  Non-Invasive Evaluation(s):  Pharmacologic MPI (04/15/17): Normal study without evidence of ischemia or scar. LVEF 71%.  Exercise stress echo (04/04/17): Submaximal workload achieving 81% MPHR. Normal LVEF at baseline with hyperdynamic response to stress. No EKG changes at submaximal workload.  Cardiac CTA (02/23/12): Coronary calcium score 0. Left dominant. Normal coronary arteries.  Exercise stress echo (01/14/12): Normal resting and stress echo images. Positive EKG changes and recovery with ST depression. Overall low risk study.  TTE (01/10/12): Normal LV size and function (EF 60-65%) with normal diastolic function. Normal RV size and function. No significant valvular abnormalities.  Exercise stress echo (04/13/10): Normal EKG and stress echocardiogram without evidence of ischemia.  TTE (04/13/10): Normal LV size  and thickness (EF 55-60%). Normal diastolic function. No significant valvular abnormalities. Normal RV size and function.  EKG:  EKG is personally reviewed.  The ekg ordered 10/01/19 demonstrates normal sinus rhythm  Recent Labs: 10/08/2019: BUN 14; Creatinine, Ser 0.77; Hemoglobin 12.8; Platelets 279.0; Potassium 3.7; Sodium 139  Recent Lipid Panel No results found for: CHOL, TRIG, HDL, CHOLHDL, VLDL, LDLCALC, LDLDIRECT  Physical Exam:    VS:  BP 134/68   Pulse 68   Ht 5' (1.524 m)   Wt 152 lb 6.4 oz (69.1 kg)   SpO2 98%   BMI 29.76 kg/m     Wt Readings from Last 3 Encounters:  12/10/19 152 lb 6.4 oz (69.1 kg)  11/21/19 146 lb (66.2 kg)  10/08/19 155 lb 3.2 oz (70.4 kg)    GEN: Well nourished, well developed in no acute distress HEENT: Normal, moist mucous membranes NECK: No JVD CARDIAC: regular rhythm, normal S1 and S2, no rubs or gallops. No murmur. VASCULAR: Radial and DP pulses 2+ bilaterally. No carotid bruits RESPIRATORY:  Clear to auscultation without rales, wheezing or rhonchi  ABDOMEN: Soft, non-tender,  non-distended MUSCULOSKELETAL:  Ambulates independently SKIN: Warm and dry, no edema NEUROLOGIC:  Alert and oriented x 3. No focal neuro deficits noted. PSYCHIATRIC:  Normal affect   ASSESSMENT:    1. Other chest pain   2. SOB (shortness of breath)   3. Cardiac risk counseling   4. Counseling on health promotion and disease prevention    PLAN:    Atypical chest pain, shortness of breath:  -no obstructive CAD by cath. Question microvascular disease, but no improvement with imdur.  -She does have esophageal/GI history including dilations, but she does not feel that this is her chest pain.  -no coronary calcium on recent noncontrast CT 10/2019 -counseled on red flag symptoms that warrant immediate medical attention -has PRN SL NG ordered -low suspicion that this is cardiac, no further testing at this time. -recommended she schedule echocardiogram that was previously ordered, she will do this  CV risk counseling and primary prevention: -recommend heart healthy/Mediterranean diet, with whole grains, fruits, vegetable, fish, lean meats, nuts, and olive oil. Limit salt. -recommend moderate walking, 3-5 times/week for 30-50 minutes each session. Aim for at least 150 minutes.week. Goal should be pace of 3 miles/hours, or walking 1.5 miles in 30 minutes -recommend avoidance of tobacco products. Avoid excess alcohol. -takes 81 mg aspirin daily. Given her GI symptoms and lack of CAD, if this leads to melena or GI upset, ok to stop.  Plan for follow up: 1 year or sooner as needed  Buford Dresser, MD, PhD [place]  [facility]   Medication Adjustments/Labs and Tests Ordered: Current medicines are reviewed at length with the patient today.  Concerns regarding medicines are outlined above.  No orders of the defined types were placed in this encounter.  No orders of the defined types were placed in this encounter.   Patient Instructions  Medication Instructions:  Your  Physician recommend you continue on your current medication as directed.    *If you need a refill on your cardiac medications before your next appointment, please call your pharmacy*  Lab Work: None  Testing/Procedures: Reschedule previous ordered ECHO  Follow-Up: At [facility], you and your health needs are our priority.  As part of our continuing mission to provide you with exceptional heart care, we have created designated Provider Care Teams.  These Care Teams include your primary Cardiologist (physician) and Advanced Practice Providers (APPs -  Physician Assistants and Nurse Practitioners) who all work together to provide you with the care you need, when you need it.  Your next appointment:   1 year(s)  The format for your next appointment:   In Person  Provider:   Buford Dresser, MD      Signed, Buford Dresser, MD PhD 12/10/2019  [place]

## 2019-12-10 NOTE — Patient Instructions (Signed)
Medication Instructions:  Your Physician recommend you continue on your current medication as directed.    *If you need a refill on your cardiac medications before your next appointment, please call your pharmacy*  Lab Work: None  Testing/Procedures: Reschedule previous ordered ECHO  Follow-Up: At [facility], you and your health needs are our priority.  As part of our continuing mission to provide you with exceptional heart care, we have created designated Provider Care Teams.  These Care Teams include your primary Cardiologist (physician) and Advanced Practice Providers (APPs -  Physician Assistants and Nurse Practitioners) who all work together to provide you with the care you need, when you need it.  Your next appointment:   1 year(s)  The format for your next appointment:   In Person  Provider:   Buford Dresser, MD

## 2019-12-13 ENCOUNTER — Other Ambulatory Visit (HOSPITAL_COMMUNITY): Payer: BC Managed Care – PPO

## 2019-12-15 ENCOUNTER — Telehealth: Payer: Self-pay | Admitting: Internal Medicine

## 2019-12-15 NOTE — Telephone Encounter (Signed)
Catherine Hancock  Please let LIONELA ARAGONEZ know that allergy test blood test on October 08, 2019 is normal.  Also high-resolution CT chest October 30, 2019 does not show any evidence of pulmonary fibrosis or COPD or lung cancer.  On the other hand it suggestive of mild reactive asthma.  There are also some incidental asymptomatic gallstones  Plan -She was supposed to get a pulmonary function test but I bet because of the delays that this is not happened.  I do not see this on the schedule.  Therefore, she has 2 options  0 option 1: Give first available telephone or video or face-to-face visit for her to discuss the results with me and discuss the next step  Option 2: Finish pulmonary function test and then return for follow-up either through telephone or video face-to-face  15 minutes slot

## 2019-12-17 NOTE — Telephone Encounter (Signed)
ATC the pt, NA and no option to leave msg, [facility]

## 2019-12-17 NOTE — Telephone Encounter (Signed)
ATC pt, received a busy signal x2. Will follow up.

## 2019-12-18 NOTE — Telephone Encounter (Signed)
Called and spoke with pt letting her know the info stated by MR. Pt stated she wanted to have OV to further discuss results and then decide next steps so scheduled pt OV with MR 3/15. Nothing further needed.

## 2020-01-02 ENCOUNTER — Encounter: Payer: Self-pay | Admitting: Cardiology

## 2020-01-07 ENCOUNTER — Ambulatory Visit (HOSPITAL_COMMUNITY): Payer: BC Managed Care – PPO | Attending: Cardiology

## 2020-01-07 ENCOUNTER — Other Ambulatory Visit: Payer: Self-pay

## 2020-01-07 DIAGNOSIS — G4733 Obstructive sleep apnea (adult) (pediatric): Secondary | ICD-10-CM | POA: Insufficient documentation

## 2020-01-07 DIAGNOSIS — R079 Chest pain, unspecified: Secondary | ICD-10-CM | POA: Diagnosis not present

## 2020-01-07 DIAGNOSIS — K219 Gastro-esophageal reflux disease without esophagitis: Secondary | ICD-10-CM | POA: Insufficient documentation

## 2020-01-07 DIAGNOSIS — K589 Irritable bowel syndrome without diarrhea: Secondary | ICD-10-CM | POA: Diagnosis present

## 2020-01-07 DIAGNOSIS — Z9989 Dependence on other enabling machines and devices: Secondary | ICD-10-CM | POA: Insufficient documentation

## 2020-01-07 DIAGNOSIS — R0602 Shortness of breath: Secondary | ICD-10-CM | POA: Diagnosis not present

## 2020-01-07 DIAGNOSIS — K222 Esophageal obstruction: Secondary | ICD-10-CM | POA: Insufficient documentation

## 2020-01-14 ENCOUNTER — Encounter: Payer: Self-pay | Admitting: Internal Medicine

## 2020-01-14 ENCOUNTER — Other Ambulatory Visit: Payer: Self-pay

## 2020-01-14 ENCOUNTER — Ambulatory Visit (INDEPENDENT_AMBULATORY_CARE_PROVIDER_SITE_OTHER): Payer: BC Managed Care – PPO | Admitting: Internal Medicine

## 2020-01-14 VITALS — BP 114/62 | HR 86 | Ht 60.0 in | Wt 152.0 lb

## 2020-01-14 DIAGNOSIS — R0602 Shortness of breath: Secondary | ICD-10-CM

## 2020-01-14 DIAGNOSIS — R053 Chronic cough: Secondary | ICD-10-CM

## 2020-01-14 DIAGNOSIS — Z8709 Personal history of other diseases of the respiratory system: Secondary | ICD-10-CM

## 2020-01-14 DIAGNOSIS — R05 Cough: Secondary | ICD-10-CM

## 2020-01-14 NOTE — Progress Notes (Signed)
Subjective:    Patient ID: Catherine Hancock, female    DOB: Jul 21, 1961, 59 y.o.   MRN: [id number]  PCP Tobe Sos, MD   HPI   IOV 10/08/2019  Chief Complaint  Patient presents with  . Consult    Self referral due to asthma. Pt stated she had the flu 2 years ago and since then she has been having problems with asthma, cough, SOB. Pt states the SOB can happen at any time.   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  Self-referred for shortness of breath and cough.  She tells me that she has had a lifelong history of asthma for which she is on Symbicort and Singulair on a scheduled basis.  Then in 2018 Easter she had influenza treated with Tamiflu as an outpatient in [facility] after family cluster.  Since then she has had cough that has persisted all along.  The cough is mild and persistent.  Never really improved.  Then in early 2020 she developed insidious onset of shortness of breath that has progressed.  She tells me she can get short of breath anytime.  At night she can wake up occasionally because of the cough but never because of shortness of breath.  There is no orthopnea proximal nocturnal dyspnea but in the daytime she perceives dyspnea for anything and randomly.  She says that when she walks definitely the shortness of breath is worse and relieved by rest there is no associated chest pain.  She has an upcoming echocardiogram.  Review of the outside notes indicate she has had cardiology work-up that has been negative.  Overall the shortness of breath is rated currently as moderate with the cough as mild and the wheezing is extremely mild.  Walking desaturation test today on room air shows pulse ox 100% on room air with a heart rate of 88/min.  After walking 3 laps her pulse ox was 98% with a heart rate of 107/min.  She walked at average pace and she was mildly dyspneic with this.  Review of  the chart indicates a history of right lower lobe lung nodule.  However there is no imaging for me to visualize.  She said rheumatoid factor test for unclear reasons for years ago and was normal  She has a history of allergies.  She says she has had a skin test many many years ago and this was positive for oak.  Otherwise details not known.  She is willing to have a full work-up at this point in time.   In terms of asthma control questionnaire she says she is waking up several times at night because of asthma.  When she wakes up she has moderate symptoms she is moderately limited in activities.  She is experiencing shortness of breath quite a lot but she is wheezing hardly any of the time.  Overall score average score would be 2.8 showing significant level of symptoms.    Results for Catherine Hancock, Catherine Hancock (MRN [id number]) as of 10/08/2019 11:26  Ref. Range 03/12/2015 11:59  Anti Nuclear Antibody (ANA) Latest Ref Range: Negative  Negative  RA Latex Turbid. Latest Ref Range: 0.0 - 13.9 IU/mL 9.3   Results for Catherine Hancock, Catherine Hancock (MRN [id number]) as of 10/08/2019 11:26  Ref. Range 01/13/2015 14:30 03/12/2015 11:59  CO2 Latest Ref Range: 18 - 29 mmol/L 19 22   OV 01/14/2020  Subjective:  Patient ID: Catherine Hancock, female , DOB: 20-Apr-1961 , age 28 y.o. , MRN: [id number] , ADDRESS: [place] [id number]   01/14/2020 -   Chief Complaint  Patient presents with  . Follow-up    Pt states she has been doing okay since last visit. States she still will become SOB at times and also states she will still occ cough.     HPI Catherine Hancock 59 y.o. -here to review results of below work-up.  In the interim she says her symptoms persist but the cough and the shortness of breath.  Without any change.  I she again insisted the shortness of breath happens randomly and she has to take a deep breath.  She also has left inframammary tightness that is chronic.  She had a recent echocardiogram that is normal.   Allergy work-up and autoimmune is normal.  Pulmonary function test not done yet because of the pandemic and disruptions in the PFT lab.  CT scan of the chest personally visualized and interpreted is essentially normal except for thickened airways of chronic bronchitis.  She is compliant with the Symbicort.  She takes albuterol as needed.  She has been taking Symbicort for a few years.  She also has a cough but not resolved.  She has not seen ENT.  She used to see Dr. Janace Hoard many years ago.  She is not had pulmonary stress test.   Allergy and autoimmuned  Results for Catherine Hancock, Catherine Hancock (MRN [id number]) as of 01/14/2020 10:56  Ref. Range 03/12/2015 11:59 10/08/2019 12:01  Sheep Sorrel IgE Latest Units: kU/L  <0.10  Pecan/Hickory Tree IgE Latest Units: kU/L  <0.10  IgE (Immunoglobulin E), Serum Latest Ref Range: <OR=114 kU/L  35  Allergen, D pternoyssinus,d7 Latest Units: kU/L  <0.10  Cat Dander Latest Units: kU/L  <0.10  Dog Dander Latest Units: kU/L  <0.10  [place] Grass Latest Units: kU/L  <0.10  Johnson Grass Latest Units: kU/L  <0.10  Timothy Grass Latest Units: kU/L  <0.10  Cockroach Latest Units: kU/L  <0.10  Aspergillus fumigatus, m3 Latest Units: kU/L  <0.10  Allergen, Comm Silver Wendee Copp, t9 Latest Units: kU/L  <0.10  Allergen, Cottonwood, t14 Latest Units: kU/L  <0.10  Elm IgE Latest Units: kU/L  <0.10  Allergen, Mulberry, t76 Latest Units: kU/L  <0.10  Allergen, Oak,t7 Latest Units: kU/L  <0.10  COMMON RAGWEED (SHORT) (W1) IGE Latest Units: kU/L  <0.10  Allergen, Mouse Urine Protein, e78 Latest Units: kU/L  <0.10  D. farinae Latest Units: kU/L  <0.10  Allergen, Cedar tree, t12 Latest Units: kU/L  <0.10  Box Elder IgE Latest Units: kU/L  <0.10  Rough Pigweed  IgE Latest Units: kU/L  <0.10  Anti Nuclear Antibody (ANA) Latest Ref Range: Negative  Negative   RA Latex Turbid. Latest Ref Range: 0.0 - 13.9 IU/mL 9.3      ECHO march 2021  IMPRESSIONS    1. Left ventricular ejection  fraction, by estimation, is 60 to 65%. The  left ventricle has normal function. The left ventricle has no regional  wall motion abnormalities. Left ventricular diastolic parameters were  normal.  2. Right ventricular systolic function is normal. The right ventricular  size is normal.  3. The mitral valve is normal in structure. No evidence of mitral valve  regurgitation. No evidence of mitral stenosis.  4. The aortic valve is tricuspid. Aortic valve regurgitation is not  visualized. No aortic stenosis is present.  5. The inferior  vena cava is normal in size with greater than 50%  respiratory variability, suggesting right atrial pressure of 3 mmHg.   Comparison(s): No prior Echocardiogram. Stress echo 04/04/17 EF 55%.   Conclusion(s)/Recommendation(s): Normal biventricular function without  evidence of hemodynamically significant valvular heart disease.      CT chest high resolution  IMPRESSION: 1. No evidence of interstitial lung disease. 2. Nonspecific mild diffuse bronchial wall thickening, as can be seen with reactive airways disease or chronic bronchitis. 3. Small hiatal hernia. 4. Cholelithiasis.   Electronically Signed   By: Ilona Sorrel M.D.   On: 10/30/2019 16:08  ROS - per HPI     has a past medical history of Asthma, Asthma, Chest pain, Chronic back pain, CTS (carpal tunnel syndrome), CTS (carpal tunnel syndrome), Hypothyroidism, IBS (irritable bowel syndrome), Internal hemorrhoids, Migraines, OSA on CPAP (11/13/2017), and Pulmonary nodule, right.   reports that she has never smoked. She has never used smokeless tobacco.  Past Surgical History:  Procedure Laterality Date  . BREAST EXCISIONAL BIOPSY Right    benign  . breast nodule surg     right breast nodule and left lymph node removed in 1994  . COLONOSCOPY  06/09/2012   Procedure: COLONOSCOPY;  Surgeon: Lafayette Dragon, MD;  Location: [facility];  Service: Endoscopy;  Laterality: N/A;  . EXCISION OF  BREAST BIOPSY Left    2 benign lymph nodes removed  . HEMORRHOID SURGERY     2007  . LYMPH NODE BIOPSY     left breast  . NASAL SINUS SURGERY     1992  . TONSILLECTOMY     1981  . TOTAL VAGINAL HYSTERECTOMY      Allergies  Allergen Reactions  . Gadolinium Derivatives Other (See Comments)    Severe pain, joints locked up  . Isosorbide Nitrate Anaphylaxis  . Latex Other (See Comments) and Shortness Of Breath    Asthma attack   . Other Shortness Of Breath and Other (See Comments)    Severe pain, joints locked up gadolinium contrast-causes joints to lock up, severe pain Severe pain, joints locked up gadolinium contrast-causes joints to lock up, severe pain  Asthma attack  . Fluticasone-Salmeterol Other (See Comments)    Blurry vision Blurry vision Blurry vision Blurry vision Blurry vision  . Contrast Media [Iodinated Diagnostic Agents]     gadolinium contrast-causes joints to lock up, severe pain  . Sulfamethoxazole-Trimethoprim Diarrhea and Nausea And Vomiting    REACTION: GI upset Other reaction(s): OTHER REACTION: GI upset Other reaction(s): OTHER Other reaction(s): OTHER    Immunization History  Administered Date(s) Administered  . Influenza Whole 07/25/2007  . Influenza,inj,Quad PF,6+ Mos 08/13/2016, 09/12/2019  . Influenza-Unspecified 08/01/2017  . Tdap 08/13/2016    Family History  Problem Relation Age of Onset  . Colon cancer Maternal Aunt        x 4  . Colon cancer Maternal Uncle   . Colon cancer Maternal Grandmother   . Heart disease Maternal Grandfather   . Breast cancer Maternal Aunt   . Ovarian cancer Maternal Aunt   . Lymphoma Mother   . Prostate cancer Father   . COPD Father   . Heart failure Father   . Hypertension Father   . Heart disease Paternal Uncle        x 4  . Heart disease Paternal Grandfather   . Mitral valve prolapse Sister      Current Outpatient Medications:  .  ALPRAZolam (XANAX) 0.5 MG tablet, Take 0.25 mg  by mouth  as directed. PRN, Disp: , Rfl:  .  aspirin EC 81 MG tablet, Take 1 tablet (81 mg total) by mouth daily., Disp: , Rfl:  .  BIOTIN PO, Take 1 tablet by mouth as directed., Disp: , Rfl:  .  budesonide-formoterol (SYMBICORT) 80-4.5 MCG/ACT inhaler, Inhale 1 puff into the lungs 2 (two) times daily as needed. , Disp: , Rfl:  .  CALCIUM PO, Take 1 tablet by mouth as directed., Disp: , Rfl:  .  Cholecalciferol (VITAMIN D3 PO), Take 25,000 Units by mouth once a week., Disp: , Rfl:  .  citalopram (CELEXA) 20 MG tablet, Take 1 tablet by mouth daily., Disp: , Rfl:  .  cyclobenzaprine (FLEXERIL) 10 MG tablet, TAKE 1 TABLET THREE TIMES A DAY AS NEEDED FOR MUSCLE SPASMS, Disp: 90 tablet, Rfl: 0 .  DONNATAL 16.2 MG tablet, Take 16.2 mg by mouth daily as needed. Stomach pain, Disp: , Rfl:  .  EPINEPHrine 0.3 mg/0.3 mL IJ SOAJ injection, as needed., Disp: , Rfl:  .  esomeprazole (NEXIUM) 40 MG capsule, Take 40 mg by mouth 2 (two) times daily before a meal. , Disp: , Rfl:  .  ibuprofen (ADVIL,MOTRIN) 800 MG tablet, Take 800 mg by mouth every 8 (eight) hours as needed (pain)., Disp: , Rfl:  .  lubiprostone (AMITIZA) 24 MCG capsule, Take 24 mcg by mouth 2 (two) times daily with a meal. , Disp: , Rfl:  .  montelukast (SINGULAIR) 10 MG tablet, Take 10 mg by mouth at bedtime., Disp: , Rfl:  .  polyethylene glycol powder (GLYCOLAX/MIRALAX) powder, Take 17 g by mouth as needed for mild constipation. , Disp: , Rfl:  .  Prenat-FeFum-Doc-FA-DHA w/o A (NEXA PLUS) 29-1.25-350 MG CAPS, Take 1 tablet by mouth Daily., Disp: , Rfl:  .  Prenatal 27-1 MG TABS, , Disp: , Rfl:  .  PROAIR HFA 108 (90 BASE) MCG/ACT inhaler, Take 2 puffs by mouth 4 (four) times daily. PRN, Disp: , Rfl:  .  SYNTHROID 75 MCG tablet, Take 75 mcg by mouth daily., Disp: , Rfl:  .  topiramate (TOPAMAX) 50 MG tablet, Take 50 mg by mouth every morning. Take 100mg  by mouth at night, Disp: , Rfl:  .  zinc gluconate 50 MG tablet, Take 50 mg by mouth daily., Disp: ,  Rfl:  .  zolpidem (AMBIEN) 10 MG tablet, Take 0.5 tablets by mouth at bedtime as needed for sleep. , Disp: , Rfl:  .  nitroGLYCERIN (NITROSTAT) 0.4 MG SL tablet, Place 1 tablet (0.4 mg total) under the tongue every 5 (five) minutes as needed for chest pain., Disp: 25 tablet, Rfl: 12      Objective:   Vitals:   01/14/20 1020  BP: 114/62  Pulse: 86  SpO2: 100%  Weight: 152 lb (68.9 kg)  Height: 5' (1.524 m)    Estimated body mass index is 29.69 kg/m as calculated from the following:   Height as of this encounter: 5' (1.524 m).   Weight as of this encounter: 152 lb (68.9 kg).  @WEIGHTCHANGE @  [employer]   01/14/20 1020  Weight: 152 lb (68.9 kg)     Physical Exam Pleasant alert and oriented.  Speech is normal oral cavity is normal.  Abdomen is soft nontender.  Clear to auscultation bilaterally normal heart sounds.  Nonfocal exam.     Assessment:       ICD-10-CM   1. Shortness of breath  R06.02   2. Chronic cough  R05  3. History of asthma  Z87.09        Plan:     Patient Instructions     ICD-10-CM   1. Shortness of breath  R06.02   2. Chronic cough  R05   3. History of asthma  Z87.09    Symptoms not fully explained  Plan  - refer Dr Blenda Nicely ENT for eval of chronic cough reltaed irritable larynx - refer Landis Martins - for CPST bike test with EIB challenge  Followup  - 4-8 weeks with APP or Dr Chase Caller to review results     SIGNATURE    Dr. Brand Males, M.D., F.C.C.P,  Pulmonary and Critical Care Medicine Staff Physician, [place] Director - Interstitial Lung Disease  Program  Pulmonary [place] [facility] [place], [place], [address]  Pager: [phone], If no answer or between  15:00h - 7:00h: call [phone] Telephone: [phone]  11:14 AM 01/14/2020

## 2020-01-14 NOTE — Patient Instructions (Signed)
ICD-10-CM   1. Shortness of breath  R06.02   2. Chronic cough  R05   3. History of asthma  Z87.09    Symptoms not fully explained  Plan  - refer Dr Blenda Nicely ENT for eval of chronic cough reltaed irritable larynx - refer Landis Martins - for CPST bike test with EIB challenge  Followup  - 4-8 weeks with APP or Dr Chase Caller to review results

## 2020-01-14 NOTE — Addendum Note (Signed)
Addended by: Lorretta Harp on: 01/14/2020 11:16 AM   Modules accepted: Orders

## 2020-01-15 DIAGNOSIS — K219 Gastro-esophageal reflux disease without esophagitis: Secondary | ICD-10-CM | POA: Insufficient documentation

## 2020-01-15 DIAGNOSIS — R490 Dysphonia: Secondary | ICD-10-CM | POA: Insufficient documentation

## 2020-01-25 ENCOUNTER — Other Ambulatory Visit: Payer: Self-pay

## 2020-01-25 ENCOUNTER — Other Ambulatory Visit (HOSPITAL_COMMUNITY)
Admission: RE | Admit: 2020-01-25 | Discharge: 2020-01-25 | Disposition: A | Discharge status: Home / Self Care | Payer: BC Managed Care – PPO | Source: Ambulatory Visit | Attending: Internal Medicine | Admitting: Internal Medicine

## 2020-01-29 ENCOUNTER — Encounter (HOSPITAL_COMMUNITY): Payer: BC Managed Care – PPO

## 2020-02-15 ENCOUNTER — Other Ambulatory Visit: Payer: Self-pay

## 2020-02-15 ENCOUNTER — Other Ambulatory Visit (HOSPITAL_COMMUNITY)
Admission: RE | Admit: 2020-02-15 | Discharge: 2020-02-15 | Disposition: A | Discharge status: Home / Self Care | Payer: BC Managed Care – PPO | Source: Ambulatory Visit | Attending: Internal Medicine | Admitting: Internal Medicine

## 2020-02-15 DIAGNOSIS — Z01812 Encounter for preprocedural laboratory examination: Secondary | ICD-10-CM | POA: Insufficient documentation

## 2020-02-15 DIAGNOSIS — Z20822 Contact with and (suspected) exposure to covid-19: Secondary | ICD-10-CM | POA: Insufficient documentation

## 2020-02-16 LAB — SARS CORONAVIRUS 2 (TAT 6-24 HRS): SARS Coronavirus 2: NEGATIVE

## 2020-02-18 ENCOUNTER — Other Ambulatory Visit (HOSPITAL_COMMUNITY): Payer: Self-pay | Admitting: *Deleted

## 2020-02-18 ENCOUNTER — Other Ambulatory Visit: Payer: Self-pay

## 2020-02-18 ENCOUNTER — Ambulatory Visit (HOSPITAL_COMMUNITY): Payer: BC Managed Care – PPO | Attending: Internal Medicine

## 2020-02-18 DIAGNOSIS — R0602 Shortness of breath: Secondary | ICD-10-CM | POA: Insufficient documentation

## 2020-02-18 DIAGNOSIS — R0600 Dyspnea, unspecified: Secondary | ICD-10-CM | POA: Diagnosis not present

## 2020-02-27 ENCOUNTER — Ambulatory Visit: Payer: BC Managed Care – PPO | Admitting: Dermatology

## 2020-03-10 ENCOUNTER — Encounter: Payer: Self-pay | Admitting: Dermatology

## 2020-03-10 ENCOUNTER — Ambulatory Visit (INDEPENDENT_AMBULATORY_CARE_PROVIDER_SITE_OTHER): Payer: BC Managed Care – PPO | Admitting: Dermatology

## 2020-03-10 ENCOUNTER — Other Ambulatory Visit: Payer: Self-pay

## 2020-03-10 DIAGNOSIS — L738 Other specified follicular disorders: Secondary | ICD-10-CM

## 2020-03-10 DIAGNOSIS — L821 Other seborrheic keratosis: Secondary | ICD-10-CM | POA: Diagnosis not present

## 2020-03-10 DIAGNOSIS — L219 Seborrheic dermatitis, unspecified: Secondary | ICD-10-CM | POA: Diagnosis not present

## 2020-03-10 DIAGNOSIS — D225 Melanocytic nevi of trunk: Secondary | ICD-10-CM | POA: Diagnosis not present

## 2020-03-10 DIAGNOSIS — D1801 Hemangioma of skin and subcutaneous tissue: Secondary | ICD-10-CM

## 2020-03-10 DIAGNOSIS — D229 Melanocytic nevi, unspecified: Secondary | ICD-10-CM

## 2020-03-10 DIAGNOSIS — D485 Neoplasm of uncertain behavior of skin: Secondary | ICD-10-CM | POA: Diagnosis not present

## 2020-03-10 NOTE — Patient Instructions (Signed)

## 2020-03-10 NOTE — Progress Notes (Addendum)
   New Patient   Subjective  Catherine Hancock is a 59 y.o. female who presents for the following: New Patient (Initial Visit) (Here for general look over. Last skin check was in 2011. Has a couple places that she is concerned about with flaking and itching. No history of NMSC or MM.).  Moles Location: All over Duration: Most for years Quality: 1 on abdomen may be darker Associated Signs/Symptoms: No bleeding or symptom Modifying Factors:  Severity:  Timing: Context:    The following portions of the chart were reviewed this encounter and updated as appropriate: Tobacco  Allergies  Meds  Problems  Med Hx  Surg Hx  Fam Hx      Objective  Well appearing patient in no apparent distress; mood and affect are within normal limits.  A full examination was performed including scalp, head, eyes, ears, nose, lips, neck, chest, axillae, abdomen, back, buttocks, bilateral upper extremities, bilateral lower extremities, hands, feet, fingers, toes, fingernails, and toenails. All findings within normal limits unless otherwise noted below.   Assessment & Plan  Neoplasm of uncertain behavior of skin Left Abdomen (side) - Upper  Skin / nail biopsy Type of biopsy: tangential   Informed consent: discussed and consent obtained   Timeout: patient name, date of birth, surgical site, and procedure verified   Procedure prep:  Patient was prepped and draped in usual sterile fashion (Non sterile) Prep type:  Chlorhexidine Anesthesia: the lesion was anesthetized in a standard fashion   Anesthetic:  1% lidocaine w/ epinephrine 1-100,000 local infiltration Instrument used: flexible razor blade   Hemostasis achieved with: aluminum chloride and ferric subsulfate   Outcome: patient tolerated procedure well   Post-procedure details: sterile dressing applied and wound care instructions given   Dressing type: bandage and petrolatum    Specimen 1 - Surgical pathology Differential Diagnosis: atypia Check  Margins: yes  Nevus Mid Back  Seborrheic keratosis (3) Right Thigh - Posterior; Left Lower Leg - Posterior; Left Malar Cheek  Reassure  Sebaceous hyperplasia (2) Mid Forehead  Fissure  Seborrheic dermatitis (2) Right Cymba; Left Concha  Over-the-counter 1% hydrocortisone First visit in over 3 years for [name] who comes with a variety of skin problems.  She put marks on multiple skin spots.  All of the spots on the legs as well as 1 underneath the left eye represent benign keratoses.  A similar spot on the right wrist may be the same or a flat wart; this causes pain and was treated with liquid nitrogen freezing.  She understands this is not 100% effective.  With 1 exceptions all pigmented spots were clinically benign.  There is a darkening 5 mm macule on the upper central abdomen that shows minor dermatoscope pink atypia.  Shave biopsy obtained; [name] can get this result on MyChart or by calling my office in 2 days.  Scale in the distal ear canals most likely represents localized adult seborrheic eczema.  She can use over-the-counter 1% hydrocortisone cream or lotion nightly for 2 weeks; if this fails, she can call us and we will phone in a prescription.  Subtle bumps on the forehead represents sebaceous hyperplasia and are best left alone unless there is clinical change.  Multiple 4 mm angiomas on the torso, right calf, and left upper forehead can safely be left.

## 2020-03-11 ENCOUNTER — Encounter: Payer: Self-pay | Admitting: Dermatology

## 2020-04-12 NOTE — Addendum Note (Signed)
Addended by: Lavonna Monarch on: 04/12/2020 03:43 PM   Modules accepted: Level of Service

## 2020-05-27 ENCOUNTER — Other Ambulatory Visit: Payer: Self-pay

## 2020-05-27 ENCOUNTER — Encounter (HOSPITAL_COMMUNITY): Payer: Self-pay | Admitting: *Deleted

## 2020-05-27 ENCOUNTER — Inpatient Hospital Stay (HOSPITAL_COMMUNITY)
Admission: EM | Admit: 2020-05-27 | Discharge: 2020-06-05 | DRG: 177 | Disposition: A | Discharge status: Home / Self Care | Payer: BC Managed Care – PPO | Attending: Internal Medicine | Admitting: Internal Medicine

## 2020-05-27 ENCOUNTER — Emergency Department (HOSPITAL_COMMUNITY): Payer: BC Managed Care – PPO

## 2020-05-27 DIAGNOSIS — G43909 Migraine, unspecified, not intractable, without status migrainosus: Secondary | ICD-10-CM | POA: Diagnosis present

## 2020-05-27 DIAGNOSIS — R0902 Hypoxemia: Secondary | ICD-10-CM

## 2020-05-27 DIAGNOSIS — F418 Other specified anxiety disorders: Secondary | ICD-10-CM | POA: Diagnosis present

## 2020-05-27 DIAGNOSIS — R0600 Dyspnea, unspecified: Secondary | ICD-10-CM

## 2020-05-27 DIAGNOSIS — Z8042 Family history of malignant neoplasm of prostate: Secondary | ICD-10-CM

## 2020-05-27 DIAGNOSIS — Z800 Family history of malignant neoplasm of digestive organs: Secondary | ICD-10-CM

## 2020-05-27 DIAGNOSIS — J1282 Pneumonia due to coronavirus disease 2019: Secondary | ICD-10-CM | POA: Diagnosis present

## 2020-05-27 DIAGNOSIS — U071 COVID-19: Principal | ICD-10-CM | POA: Diagnosis present

## 2020-05-27 DIAGNOSIS — Z79890 Hormone replacement therapy (postmenopausal): Secondary | ICD-10-CM

## 2020-05-27 DIAGNOSIS — Z803 Family history of malignant neoplasm of breast: Secondary | ICD-10-CM

## 2020-05-27 DIAGNOSIS — Z792 Long term (current) use of antibiotics: Secondary | ICD-10-CM

## 2020-05-27 DIAGNOSIS — E039 Hypothyroidism, unspecified: Secondary | ICD-10-CM | POA: Diagnosis present

## 2020-05-27 DIAGNOSIS — Z7982 Long term (current) use of aspirin: Secondary | ICD-10-CM

## 2020-05-27 DIAGNOSIS — Z8249 Family history of ischemic heart disease and other diseases of the circulatory system: Secondary | ICD-10-CM

## 2020-05-27 DIAGNOSIS — E876 Hypokalemia: Secondary | ICD-10-CM | POA: Diagnosis present

## 2020-05-27 DIAGNOSIS — G4733 Obstructive sleep apnea (adult) (pediatric): Secondary | ICD-10-CM

## 2020-05-27 DIAGNOSIS — J45909 Unspecified asthma, uncomplicated: Secondary | ICD-10-CM | POA: Diagnosis present

## 2020-05-27 DIAGNOSIS — Z8041 Family history of malignant neoplasm of ovary: Secondary | ICD-10-CM

## 2020-05-27 DIAGNOSIS — J9601 Acute respiratory failure with hypoxia: Secondary | ICD-10-CM | POA: Diagnosis present

## 2020-05-27 DIAGNOSIS — Z807 Family history of other malignant neoplasms of lymphoid, hematopoietic and related tissues: Secondary | ICD-10-CM

## 2020-05-27 DIAGNOSIS — Z79899 Other long term (current) drug therapy: Secondary | ICD-10-CM

## 2020-05-27 DIAGNOSIS — F32A Depression, unspecified: Secondary | ICD-10-CM | POA: Diagnosis not present

## 2020-05-27 DIAGNOSIS — Z825 Family history of asthma and other chronic lower respiratory diseases: Secondary | ICD-10-CM

## 2020-05-27 NOTE — ED Triage Notes (Signed)
Pt states that she has had high fevers. She says she has asthma and feels SOB, has been using inhalers and nebs at home without relief. +cough, nausea, loss of taste and smell. COVID + at [facility] 05/19/2020

## 2020-05-28 ENCOUNTER — Encounter (HOSPITAL_COMMUNITY): Payer: Self-pay | Admitting: Internal Medicine

## 2020-05-28 DIAGNOSIS — E039 Hypothyroidism, unspecified: Secondary | ICD-10-CM | POA: Diagnosis present

## 2020-05-28 DIAGNOSIS — J1282 Pneumonia due to coronavirus disease 2019: Secondary | ICD-10-CM | POA: Diagnosis present

## 2020-05-28 DIAGNOSIS — F329 Major depressive disorder, single episode, unspecified: Secondary | ICD-10-CM

## 2020-05-28 DIAGNOSIS — Z9989 Dependence on other enabling machines and devices: Secondary | ICD-10-CM

## 2020-05-28 DIAGNOSIS — R0902 Hypoxemia: Secondary | ICD-10-CM | POA: Diagnosis present

## 2020-05-28 DIAGNOSIS — J4540 Moderate persistent asthma, uncomplicated: Secondary | ICD-10-CM | POA: Diagnosis not present

## 2020-05-28 DIAGNOSIS — G43909 Migraine, unspecified, not intractable, without status migrainosus: Secondary | ICD-10-CM | POA: Diagnosis present

## 2020-05-28 DIAGNOSIS — Z79899 Other long term (current) drug therapy: Secondary | ICD-10-CM | POA: Diagnosis not present

## 2020-05-28 DIAGNOSIS — G4733 Obstructive sleep apnea (adult) (pediatric): Secondary | ICD-10-CM | POA: Diagnosis present

## 2020-05-28 DIAGNOSIS — U071 COVID-19: Secondary | ICD-10-CM | POA: Diagnosis present

## 2020-05-28 DIAGNOSIS — Z8041 Family history of malignant neoplasm of ovary: Secondary | ICD-10-CM | POA: Diagnosis not present

## 2020-05-28 DIAGNOSIS — Z825 Family history of asthma and other chronic lower respiratory diseases: Secondary | ICD-10-CM | POA: Diagnosis not present

## 2020-05-28 DIAGNOSIS — Z800 Family history of malignant neoplasm of digestive organs: Secondary | ICD-10-CM | POA: Diagnosis not present

## 2020-05-28 DIAGNOSIS — Z807 Family history of other malignant neoplasms of lymphoid, hematopoietic and related tissues: Secondary | ICD-10-CM | POA: Diagnosis not present

## 2020-05-28 DIAGNOSIS — Z7982 Long term (current) use of aspirin: Secondary | ICD-10-CM | POA: Diagnosis not present

## 2020-05-28 DIAGNOSIS — E876 Hypokalemia: Secondary | ICD-10-CM | POA: Diagnosis present

## 2020-05-28 DIAGNOSIS — Z803 Family history of malignant neoplasm of breast: Secondary | ICD-10-CM | POA: Diagnosis not present

## 2020-05-28 DIAGNOSIS — Z79890 Hormone replacement therapy (postmenopausal): Secondary | ICD-10-CM | POA: Diagnosis not present

## 2020-05-28 DIAGNOSIS — F418 Other specified anxiety disorders: Secondary | ICD-10-CM | POA: Diagnosis present

## 2020-05-28 DIAGNOSIS — F32A Depression, unspecified: Secondary | ICD-10-CM

## 2020-05-28 DIAGNOSIS — R609 Edema, unspecified: Secondary | ICD-10-CM | POA: Diagnosis not present

## 2020-05-28 DIAGNOSIS — Z792 Long term (current) use of antibiotics: Secondary | ICD-10-CM | POA: Diagnosis not present

## 2020-05-28 DIAGNOSIS — J45909 Unspecified asthma, uncomplicated: Secondary | ICD-10-CM | POA: Diagnosis present

## 2020-05-28 DIAGNOSIS — Z8042 Family history of malignant neoplasm of prostate: Secondary | ICD-10-CM | POA: Diagnosis not present

## 2020-05-28 DIAGNOSIS — Z8249 Family history of ischemic heart disease and other diseases of the circulatory system: Secondary | ICD-10-CM | POA: Diagnosis not present

## 2020-05-28 DIAGNOSIS — J9601 Acute respiratory failure with hypoxia: Secondary | ICD-10-CM | POA: Diagnosis present

## 2020-05-28 HISTORY — DX: Depression, unspecified: F32.A

## 2020-05-28 LAB — BASIC METABOLIC PANEL
Anion gap: 10 (ref 5–15)
BUN: 21 mg/dL — ABNORMAL HIGH (ref 6–20)
CO2: 24 mmol/L (ref 22–32)
Calcium: 9 mg/dL (ref 8.9–10.3)
Chloride: 103 mmol/L (ref 98–111)
Creatinine, Ser: 0.73 mg/dL (ref 0.44–1.00)
GFR calc Af Amer: >60 mL/min (ref >60)
GFR calc non Af Amer: >60 mL/min (ref >60)
Glucose, Bld: 112 mg/dL — ABNORMAL HIGH (ref 70–99)
Potassium: 4 mmol/L (ref 3.5–5.1)
Sodium: 137 mmol/L (ref 135–145)

## 2020-05-28 LAB — CBC WITH DIFFERENTIAL/PLATELET
Abs Immature Granulocytes: 0.23 10*3/uL — ABNORMAL HIGH (ref 0.00–0.07)
Basophils Absolute: 0 10*3/uL (ref 0.0–0.1)
Basophils Relative: 0 %
Eosinophils Absolute: 0 10*3/uL (ref 0.0–0.5)
Eosinophils Relative: 0 %
HCT: 35.2 % — ABNORMAL LOW (ref 36.0–46.0)
Hemoglobin: 11.4 g/dL — ABNORMAL LOW (ref 12.0–15.0)
Immature Granulocytes: 3 %
Lymphocytes Relative: 9 %
Lymphs Abs: 0.8 10*3/uL (ref 0.7–4.0)
MCH: 29.7 pg (ref 26.0–34.0)
MCHC: 32.4 g/dL (ref 30.0–36.0)
MCV: 91.7 fL (ref 80.0–100.0)
Monocytes Absolute: 0.3 10*3/uL (ref 0.1–1.0)
Monocytes Relative: 4 %
Neutro Abs: 7.3 10*3/uL (ref 1.7–7.7)
Neutrophils Relative %: 84 %
Platelets: 236 10*3/uL (ref 150–400)
RBC: 3.84 MIL/uL — ABNORMAL LOW (ref 3.87–5.11)
RDW: 11.7 % (ref 11.5–15.5)
WBC: 8.7 10*3/uL (ref 4.0–10.5)
nRBC: 0 % (ref 0.0–0.2)

## 2020-05-28 LAB — C-REACTIVE PROTEIN: CRP: 19.6 mg/dL — ABNORMAL HIGH (ref <1.0)

## 2020-05-28 LAB — HIV ANTIBODY (ROUTINE TESTING W REFLEX): HIV Screen 4th Generation wRfx: NONREACTIVE

## 2020-05-28 LAB — D-DIMER, QUANTITATIVE: D-Dimer, Quant: 0.84 ug/mL-FEU — ABNORMAL HIGH (ref 0.00–0.50)

## 2020-05-28 LAB — PROCALCITONIN: Procalcitonin: <0.1 ng/mL

## 2020-05-28 LAB — ABO/RH: ABO/RH(D): A POS

## 2020-05-28 LAB — LACTATE DEHYDROGENASE: LDH: 287 U/L — ABNORMAL HIGH (ref 98–192)

## 2020-05-28 LAB — FERRITIN: Ferritin: 351 ng/mL — ABNORMAL HIGH (ref 11–307)

## 2020-05-28 LAB — TRIGLYCERIDES: Triglycerides: 123 mg/dL (ref <150)

## 2020-05-28 LAB — LACTIC ACID, PLASMA: Lactic Acid, Venous: 1.1 mmol/L (ref 0.5–1.9)

## 2020-05-28 LAB — FIBRINOGEN: Fibrinogen: >800 mg/dL — ABNORMAL HIGH (ref 210–475)

## 2020-05-28 LAB — SARS CORONAVIRUS 2 BY RT PCR (HOSPITAL ORDER, PERFORMED IN ~~LOC~~ HOSPITAL LAB): SARS Coronavirus 2: POSITIVE — AB

## 2020-05-28 MED ORDER — SODIUM CHLORIDE 0.9% FLUSH
3.0000 mL | Freq: Two times a day (BID) | INTRAVENOUS | Status: DC
  Administered 2020-05-28 – 2020-06-04 (×15): 3 mL via INTRAVENOUS

## 2020-05-28 MED ORDER — LEVOTHYROXINE SODIUM 75 MCG PO TABS
75.0000 ug | ORAL_TABLET | Freq: Every day | ORAL | Status: DC
  Administered 2020-05-29 – 2020-06-05 (×8): 75 ug via ORAL
  Filled 2020-05-28 (×8): qty 1

## 2020-05-28 MED ORDER — NITROGLYCERIN 0.4 MG SL SUBL: 0.4000 mg | SUBLINGUAL_TABLET | SUBLINGUAL | Status: DC | PRN

## 2020-05-28 MED ORDER — TOPIRAMATE 25 MG PO TABS
50.0000 mg | ORAL_TABLET | Freq: Every day | ORAL | Status: DC
  Administered 2020-05-29 – 2020-06-05 (×8): 50 mg via ORAL
  Filled 2020-05-28 (×8): qty 2

## 2020-05-28 MED ORDER — CITALOPRAM HYDROBROMIDE 20 MG PO TABS
20.0000 mg | ORAL_TABLET | Freq: Every day | ORAL | Status: DC
  Administered 2020-05-29 – 2020-06-05 (×8): 20 mg via ORAL
  Filled 2020-05-28 (×8): qty 1

## 2020-05-28 MED ORDER — ALPRAZOLAM 0.25 MG PO TABS
0.2500 mg | ORAL_TABLET | Freq: Every day | ORAL | Status: DC | PRN
  Administered 2020-05-28 – 2020-05-30 (×2): 0.25 mg via ORAL
  Filled 2020-05-28 (×2): qty 1

## 2020-05-28 MED ORDER — ZINC SULFATE 220 (50 ZN) MG PO CAPS
220.0000 mg | ORAL_CAPSULE | Freq: Every day | ORAL | Status: DC
  Administered 2020-05-28 – 2020-06-05 (×9): 220 mg via ORAL
  Filled 2020-05-28 (×9): qty 1

## 2020-05-28 MED ORDER — SODIUM CHLORIDE 0.9 % IV SOLN
100.0000 mg | Freq: Every day | INTRAVENOUS | Status: AC
  Administered 2020-05-29 – 2020-06-01 (×4): 100 mg via INTRAVENOUS
  Filled 2020-05-28 (×4): qty 20

## 2020-05-28 MED ORDER — ENOXAPARIN SODIUM 40 MG/0.4ML ~~LOC~~ SOLN
40.0000 mg | SUBCUTANEOUS | Status: DC
  Administered 2020-05-28 – 2020-06-05 (×9): 40 mg via SUBCUTANEOUS
  Filled 2020-05-28 (×9): qty 0.4

## 2020-05-28 MED ORDER — POLYETHYLENE GLYCOL 3350 17 G PO PACK: 17.0000 g | PACK | Freq: Every day | ORAL | Status: DC | PRN

## 2020-05-28 MED ORDER — MONTELUKAST SODIUM 10 MG PO TABS
10.0000 mg | ORAL_TABLET | Freq: Every day | ORAL | Status: DC
  Administered 2020-05-28 – 2020-06-04 (×8): 10 mg via ORAL
  Filled 2020-05-28 (×9): qty 1

## 2020-05-28 MED ORDER — ASCORBIC ACID 500 MG PO TABS
500.0000 mg | ORAL_TABLET | Freq: Every day | ORAL | Status: DC
  Administered 2020-05-28 – 2020-06-05 (×9): 500 mg via ORAL
  Filled 2020-05-28 (×9): qty 1

## 2020-05-28 MED ORDER — SODIUM CHLORIDE 0.9 % IV SOLN
200.0000 mg | Freq: Once | INTRAVENOUS | Status: AC
  Administered 2020-05-28: 200 mg via INTRAVENOUS
  Filled 2020-05-28: qty 40

## 2020-05-28 MED ORDER — ZOLPIDEM TARTRATE 5 MG PO TABS: 5.0000 mg | ORAL_TABLET | Freq: Every evening | ORAL | Status: DC | PRN

## 2020-05-28 MED ORDER — HYDROCODONE-ACETAMINOPHEN 5-325 MG PO TABS
1.0000 | ORAL_TABLET | Freq: Four times a day (QID) | ORAL | Status: DC | PRN
  Administered 2020-05-28 – 2020-05-29 (×3): 1 via ORAL
  Filled 2020-05-28 (×3): qty 1

## 2020-05-28 MED ORDER — CYCLOBENZAPRINE HCL 10 MG PO TABS
10.0000 mg | ORAL_TABLET | Freq: Every day | ORAL | Status: DC
  Administered 2020-05-28 – 2020-06-04 (×8): 10 mg via ORAL
  Filled 2020-05-28 (×8): qty 1

## 2020-05-28 MED ORDER — ALBUTEROL SULFATE HFA 108 (90 BASE) MCG/ACT IN AERS
8.0000 | INHALATION_SPRAY | Freq: Once | RESPIRATORY_TRACT | Status: AC
  Administered 2020-05-28: 8 via RESPIRATORY_TRACT
  Filled 2020-05-28: qty 6.7

## 2020-05-28 MED ORDER — HYDROCOD POLST-CPM POLST ER 10-8 MG/5ML PO SUER
5.0000 mL | Freq: Two times a day (BID) | ORAL | Status: DC | PRN
  Administered 2020-05-28 – 2020-05-30 (×3): 5 mL via ORAL
  Filled 2020-05-28 (×3): qty 5

## 2020-05-28 MED ORDER — GUAIFENESIN-DM 100-10 MG/5ML PO SYRP
10.0000 mL | ORAL_SOLUTION | ORAL | Status: DC | PRN
  Administered 2020-05-29 – 2020-05-31 (×2): 10 mL via ORAL
  Filled 2020-05-28 (×2): qty 10

## 2020-05-28 MED ORDER — ASPIRIN EC 81 MG PO TBEC
81.0000 mg | DELAYED_RELEASE_TABLET | Freq: Every day | ORAL | Status: DC
  Administered 2020-05-29 – 2020-06-05 (×8): 81 mg via ORAL
  Filled 2020-05-28 (×8): qty 1

## 2020-05-28 MED ORDER — ALBUTEROL SULFATE HFA 108 (90 BASE) MCG/ACT IN AERS
2.0000 | INHALATION_SPRAY | Freq: Four times a day (QID) | RESPIRATORY_TRACT | Status: DC
  Administered 2020-05-28 – 2020-06-05 (×31): 2 via RESPIRATORY_TRACT
  Filled 2020-05-28 (×2): qty 6.7

## 2020-05-28 MED ORDER — SODIUM CHLORIDE 0.9 % IV SOLN: Freq: Once | INTRAVENOUS | Status: AC

## 2020-05-28 MED ORDER — ACETAMINOPHEN 325 MG PO TABS
650.0000 mg | ORAL_TABLET | Freq: Four times a day (QID) | ORAL | Status: DC | PRN
  Administered 2020-05-28 – 2020-06-04 (×4): 650 mg via ORAL
  Filled 2020-05-28 (×4): qty 2

## 2020-05-28 MED ORDER — PB-HYOSCY-ATROPINE-SCOPOLAMINE 16.2 MG PO TABS
1.0000 | ORAL_TABLET | Freq: Every day | ORAL | Status: DC | PRN
  Filled 2020-05-28: qty 1

## 2020-05-28 MED ORDER — ONDANSETRON HCL 4 MG PO TABS
4.0000 mg | ORAL_TABLET | Freq: Four times a day (QID) | ORAL | Status: DC | PRN
  Administered 2020-05-28 – 2020-06-05 (×5): 4 mg via ORAL
  Filled 2020-05-28 (×5): qty 1

## 2020-05-28 MED ORDER — DEXAMETHASONE 6 MG PO TABS
6.0000 mg | ORAL_TABLET | Freq: Every day | ORAL | Status: DC
  Administered 2020-05-28: 6 mg via ORAL
  Filled 2020-05-28: qty 2

## 2020-05-28 MED ORDER — PANTOPRAZOLE SODIUM 40 MG PO TBEC
40.0000 mg | DELAYED_RELEASE_TABLET | Freq: Two times a day (BID) | ORAL | Status: DC
  Administered 2020-05-28 – 2020-06-05 (×17): 40 mg via ORAL
  Filled 2020-05-28 (×17): qty 1

## 2020-05-28 MED ORDER — TOPIRAMATE 100 MG PO TABS
100.0000 mg | ORAL_TABLET | Freq: Every day | ORAL | Status: DC
  Administered 2020-05-28 – 2020-06-04 (×8): 100 mg via ORAL
  Filled 2020-05-28 (×8): qty 1

## 2020-05-28 MED ORDER — MOMETASONE FURO-FORMOTEROL FUM 100-5 MCG/ACT IN AERO
2.0000 | INHALATION_SPRAY | Freq: Two times a day (BID) | RESPIRATORY_TRACT | Status: DC
  Administered 2020-05-28 – 2020-06-05 (×16): 2 via RESPIRATORY_TRACT
  Filled 2020-05-28 (×2): qty 8.8

## 2020-05-28 MED ORDER — ONDANSETRON HCL 4 MG/2ML IJ SOLN
4.0000 mg | Freq: Four times a day (QID) | INTRAMUSCULAR | Status: DC | PRN
  Administered 2020-05-29 – 2020-06-04 (×6): 4 mg via INTRAVENOUS
  Filled 2020-05-28 (×6): qty 2

## 2020-05-28 NOTE — Progress Notes (Signed)
Received to room 5W08 from ER via stretcher. Assisted to bed and positioned for comfort. Oriented to room, bed and unit. In no acute distress.

## 2020-05-28 NOTE — ED Notes (Signed)
Pt brought back to room. Pt SpO2 86% RA. Placed on 2 liters

## 2020-05-28 NOTE — ED Notes (Signed)
Report called to rn on 5 w 

## 2020-05-28 NOTE — H&P (Signed)
History and Physical    IYONA Hancock MVH:[id number] DOB: 06-06-1961 DOA: 05/27/2020  Referring MD/NP/PA: Anderson Malta, PA-C PCP: Tobe Sos, MD  Patient coming from: Home  Chief Complaint: Fever and shortness of breath  I have personally briefly reviewed patient's old medical records in [place]   HPI: Catherine Hancock is a 59 y.o. female with medical history significant of asthma, hypothyroidism, depression, migraine headaches, and OSA presents with complaints of fever and shortness of breath.  She was initially tested for COVID-19 at [facility] on 7/19.  The rapid test was negative, but she was informed that the confirmatory test was positive the following day.  Patient complains of having severe body aches, a productive cough, chest tightness, wheezing, chest pain, abdominal pain, nausea, loss of taste, loss of smell, and headaches.  She had been utilizing NyQuil and over-the-counter medicines without any improvement.  Her primary care provider had prescribed her a Medrol Dosepak as well as azithromycin and full dose aspirin for DVT prophylaxis without any improvement in her symptoms.  Reported fevers elevated up to 103 F at home and her shortness of breath seem to worsen despite using inhalers.  She decided to come to the hospital with symptoms did not appear to be improving.  Denied having any diarrhea, vomiting, or focal weakness.  She had not getting the COVID-19 vaccine due to history of anaphylaxis.  ED Course: Upon admission into the emergency department patient was seen to have a temperature of 100.3 F, respirations 18-29, O2 saturations as low as 86% on room air improved with 2 L of nasal cannula oxygen, and all other vital signs relatively maintained.  Labs for WBC 8.7 with left shift, hemoglobin 11.4, BUN 21, and creatinine 0.73.  Chest x-ray significant for bilateral patchy opacities.  COVID-19 screening was positive.  Inflammatory markers were pending.  Patient was  given albuterol inhaler.  TRH called to admit.  Review of Systems  Constitutional: Positive for fever and malaise/fatigue.  HENT: Positive for congestion. Negative for hearing loss.   Eyes: Negative for photophobia and pain.  Respiratory: Positive for cough, sputum production, shortness of breath and wheezing.   Cardiovascular: Positive for chest pain. Negative for leg swelling.  Gastrointestinal: Positive for abdominal pain and nausea. Negative for diarrhea and vomiting.  Genitourinary: Negative for dysuria and frequency.  Musculoskeletal: Positive for joint pain and myalgias.  Skin: Negative for rash.  Neurological: Positive for weakness and headaches. Negative for loss of consciousness.  Endo/Heme/Allergies: Positive for environmental allergies. Negative for polydipsia.  Psychiatric/Behavioral: Negative for substance abuse.    Past Medical History:  Diagnosis Date  . Asthma   . Asthma   . Chest pain    LHC 7/08: EF 60%, normal coronaries; ETT-Echo 6/11: EF 55-60%, no ischemia  . Chronic back pain   . CTS (carpal tunnel syndrome)    bil  . CTS (carpal tunnel syndrome)   . Hypothyroidism   . IBS (irritable bowel syndrome)   . Internal hemorrhoids   . Migraines   . OSA on CPAP 11/13/2017   Mild with AHI 13/hr   . Pulmonary nodule, right     Past Surgical History:  Procedure Laterality Date  . BREAST EXCISIONAL BIOPSY Right    benign  . breast nodule surg     right breast nodule and left lymph node removed in 1994  . COLONOSCOPY  06/09/2012   Procedure: COLONOSCOPY;  Surgeon: Lafayette Dragon, MD;  Location: [facility]COPY;  Service: Endoscopy;  Laterality: N/A;  . EXCISION OF BREAST BIOPSY Left    2 benign lymph nodes removed  . HEMORRHOID SURGERY     2007  . LYMPH NODE BIOPSY     left breast  . NASAL SINUS SURGERY     1992  . TONSILLECTOMY     1981  . TOTAL VAGINAL HYSTERECTOMY       reports that she has never smoked. She has never used smokeless tobacco. She reports  that she does not drink alcohol and does not use drugs.  Allergies  Allergen Reactions  . Gadolinium Derivatives Other (See Comments)    Severe pain, joints locked up  . Isosorbide Nitrate Anaphylaxis  . Latex Other (See Comments) and Shortness Of Breath    Asthma attack   . Other Shortness Of Breath and Other (See Comments)    Severe pain, joints locked up gadolinium contrast-causes joints to lock up, severe pain Severe pain, joints locked up gadolinium contrast-causes joints to lock up, severe pain  Asthma attack  . Fluticasone-Salmeterol Other (See Comments)    Blurry vision Blurry vision Blurry vision Blurry vision Blurry vision  . Contrast Media [Iodinated Diagnostic Agents]     gadolinium contrast-causes joints to lock up, severe pain  . Sulfamethoxazole-Trimethoprim Diarrhea and Nausea And Vomiting    REACTION: GI upset Other reaction(s): OTHER REACTION: GI upset Other reaction(s): OTHER Other reaction(s): OTHER    Family History  Problem Relation Age of Onset  . Colon cancer Maternal Aunt        x 4  . Colon cancer Maternal Uncle   . Colon cancer Maternal Grandmother   . Heart disease Maternal Grandfather   . Breast cancer Maternal Aunt   . Ovarian cancer Maternal Aunt   . Lymphoma Mother   . Prostate cancer Father   . COPD Father   . Heart failure Father   . Hypertension Father   . Heart disease Paternal Uncle        x 4  . Heart disease Paternal Grandfather   . Mitral valve prolapse Sister     Prior to Admission medications   Medication Sig Start Date End Date Taking? Authorizing Provider  ALPRAZolam Duanne Moron) 0.5 MG tablet Take 0.25 mg by mouth as directed. PRN 11/20/11   [provider]  amoxicillin (AMOXIL) 500 MG tablet Take 500 mg by mouth 3 (three) times daily. 03/03/20   [provider]  aspirin EC 81 MG tablet Take 1 tablet (81 mg total) by mouth daily. 03/18/17   End, Harrell Gave, MD  BIOTIN PO Take 1 tablet by mouth as  directed.    [provider]  budesonide-formoterol (SYMBICORT) 80-4.5 MCG/ACT inhaler Inhale 1 puff into the lungs 2 (two) times daily as needed.     [provider]  CALCIUM PO Take 1 tablet by mouth as directed.    [provider]  Cholecalciferol (VITAMIN D3 PO) Take 25,000 Units by mouth once a week.    [provider]  citalopram (CELEXA) 20 MG tablet Take 1 tablet by mouth daily. 11/10/18   [provider]  cyclobenzaprine (FLEXERIL) 10 MG tablet TAKE 1 TABLET THREE TIMES A DAY AS NEEDED FOR MUSCLE SPASMS 03/10/16   Melvenia Beam, MD  DONNATAL 16.2 MG tablet Take 16.2 mg by mouth daily as needed. Stomach pain 02/13/17   [provider]  EPINEPHrine 0.3 mg/0.3 mL IJ SOAJ injection as needed. 05/25/19   [provider]  esomeprazole (NEXIUM) 40 MG capsule Take  40 mg by mouth 2 (two) times daily before a meal.  03/02/17 03/10/20  [provider]  ibuprofen (ADVIL,MOTRIN) 800 MG tablet Take 800 mg by mouth every 8 (eight) hours as needed (pain).    [provider]  montelukast (SINGULAIR) 10 MG tablet Take 10 mg by mouth at bedtime.    [provider]  nitroGLYCERIN (NITROSTAT) 0.4 MG SL tablet Place 1 tablet (0.4 mg total) under the tongue every 5 (five) minutes as needed for chest pain. 03/06/12 11/21/19  Larey Dresser, MD  polyethylene glycol powder (GLYCOLAX/MIRALAX) powder Take 17 g by mouth as needed for mild constipation.     [provider]  Prenat-FeFum-Doc-FA-DHA w/o A (NEXA PLUS) 29-1.25-350 MG CAPS Take 1 tablet by mouth Daily. 11/20/11   [provider]  Prenatal 27-1 MG TABS  05/15/19   [provider]  PROAIR HFA 108 (90 BASE) MCG/ACT inhaler Take 2 puffs by mouth 4 (four) times daily. PRN 11/30/14   [provider]  SYNTHROID 75 MCG tablet Take 75 mcg by mouth daily. 12/11/14   [provider]  topiramate (TOPAMAX) 50 MG tablet Take 50 mg by mouth every  morning. Take 100mg  by mouth at night    [provider]  zinc gluconate 50 MG tablet Take 50 mg by mouth daily.    [provider]  zolpidem (AMBIEN) 10 MG tablet Take 0.5 tablets by mouth at bedtime as needed for sleep.  11/20/11   [provider]    Physical Exam:  Constitutional: Middle-aged female who appears acutely ill Vitals:   05/28/20 0505 05/28/20 0535 05/28/20 0605 05/28/20 0635  BP:      Pulse: 67  77 72  Resp: (!) 29  23 19   Temp:      TempSrc:      SpO2: 98% 93% 91% 95%  Weight:      Height:       Eyes: PERRL, lids and conjunctivae normal ENMT: Mucous membranes are moist. Posterior pharynx clear of any exudate or lesions.  Neck: normal, supple, no masses, no thyromegaly Respiratory: Mildly tachypneic with crackles heard throughout both lung fields.  No significant wheezes appreciated.  Currently on 2 L of oxygen with O2 saturations intermittently dropping as low as 88% while talking.   Cardiovascular: Regular rate and rhythm, no murmurs / rubs / gallops. No extremity edema. 2+ pedal pulses. No carotid bruits.  Abdomen: no tenderness, no masses palpated. No hepatosplenomegaly. Bowel sounds positive.  Musculoskeletal: no clubbing / cyanosis. No joint deformity upper and lower extremities. Good ROM, no contractures. Normal muscle tone.  Skin: Diaphoretic.  No rashes, lesions, ulcers. No induration Neurologic: CN 2-12 grossly intact. Sensation intact, DTR normal. Strength 5/5 in all 4.  Psychiatric: Normal judgment and insight. Alert and oriented x 3. Normal mood.     Labs on Admission: I have personally reviewed following labs and imaging studies  CBC: Recent Labs  Lab 05/28/20 0536  WBC 8.7  NEUTROABS 7.3  HGB 11.4*  HCT 35.2*  MCV 91.7  PLT 751   Basic Metabolic Panel: Recent Labs  Lab 05/28/20 0536  NA 137  K 4.0  CL 103  CO2 24  GLUCOSE 112*  BUN 21*  CREATININE 0.73  CALCIUM 9.0   GFR: Estimated Creatinine  Clearance: 65.6 mL/min (by C-G formula based on SCr of 0.73 mg/dL). Liver Function Tests: No results for input(s): AST, ALT, ALKPHOS, BILITOT, PROT, ALBUMIN in the last 168 hours. No results for  input(s): LIPASE, AMYLASE in the last 168 hours. No results for input(s): AMMONIA in the last 168 hours. Coagulation Profile: No results for input(s): INR, PROTIME in the last 168 hours. Cardiac Enzymes: No results for input(s): CKTOTAL, CKMB, CKMBINDEX, TROPONINI in the last 168 hours. BNP (last 3 results) No results for input(s): PROBNP in the last 8760 hours. HbA1C: No results for input(s): HGBA1C in the last 72 hours. CBG: No results for input(s): GLUCAP in the last 168 hours. Lipid Profile: No results for input(s): CHOL, HDL, LDLCALC, TRIG, CHOLHDL, LDLDIRECT in the last 72 hours. Thyroid Function Tests: No results for input(s): TSH, T4TOTAL, FREET4, T3FREE, THYROIDAB in the last 72 hours. Anemia Panel: No results for input(s): VITAMINB12, FOLATE, FERRITIN, TIBC, IRON, RETICCTPCT in the last 72 hours. Urine analysis: No results found for: COLORURINE, APPEARANCEUR, LABSPEC, PHURINE, GLUCOSEU, HGBUR, BILIRUBINUR, KETONESUR, PROTEINUR, UROBILINOGEN, NITRITE, LEUKOCYTESUR Sepsis Labs: Recent Results (from the past 240 hour(s))  SARS Coronavirus 2 by RT PCR (hospital order, performed in [facility] hospital lab) Nasopharyngeal Nasopharyngeal Swab     Status: Abnormal   Collection Time: 05/28/20  5:36 AM   Specimen: Nasopharyngeal Swab  Result Value Ref Range Status   SARS Coronavirus 2 POSITIVE (A) NEGATIVE Final    Comment: RESULT CALLED TO, READ BACK BY AND VERIFIED WITH: RN MEGAN R. BY MESSAN H. AT 3532 ON 05/28/2020 (NOTE) SARS-CoV-2 target nucleic acids are DETECTED  SARS-CoV-2 RNA is generally detectable in upper respiratory specimens  during the acute phase of infection.  Positive results are indicative  of the presence of the identified virus, but do not rule out bacterial  infection or co-infection with other pathogens not detected by the test.  Clinical correlation with patient history and  other diagnostic information is necessary to determine patient infection status.  The expected result is negative.  Fact Sheet for Patients:   StrictlyIdeas.no   Fact Sheet for Healthcare Providers:   BankingDealers.co.za    This test is not yet approved or cleared by the [place] FDA and  has been authorized for detection and/or diagnosis of SARS-CoV-2 by FDA under an Emergency Use Authorization (EUA).  This EUA will remain in effect (mean ing this test can be used) for the duration of  the COVID-19 declaration under Section 564(b)(1) of the Act, 21 U.S.C. section 360-bbb-3(b)(1), unless the authorization is terminated or revoked sooner.  Performed at [facility], [place] [address] [place], [place] [address]      Radiological Exams on Admission: DG Chest Portable 1 View  Result Date: 05/27/2020 CLINICAL DATA:  COVID-19 positivity with cough and fevers EXAM: PORTABLE CHEST 1 VIEW COMPARISON:  10/30/2019 FINDINGS: Cardiac shadow is within normal limits. Patchy opacities are noted in both lungs. The lungs are hypoinflated. No sizable effusion is seen. No bony abnormality is noted. IMPRESSION: Patchy bilateral airspace opacities consistent with the given clinical history. Electronically Signed   By: Inez Catalina M.D.   On: 05/27/2020 21:35    Chest x-ray: Independently reviewed.  Poor inspiratory effort with bilateral pulmonary infiltrates appreciated.  Assessment/Plan Respiratory failure with hypoxia secondary to pneumonia due to COVID-19: Acute.  Patient presents after recently been diagnosed with COVID-19 on 7/19 with complaints of fever, productive cough, shortness of breath, loss of taste/smell, headache, and generalized myalgias.  Chest x-ray significant for bilateral opacities.  O2 saturations as low as  86% on room air with improvement on nasal oxygen. -Admit to a medical telemetry bed -Continuous pulse oximetry with nasal cannula  oxygen to maintain O2 saturation greater than 90% -follow-up Blood cultures or anything -Albuterol inhaler -Remdesivir per pharmacy -Decadron 6 mg daily -Antitussives as needed -Vitamin C and zinc  -Acetaminophen as needed for fever -Continue to monitor inflammatory markers daily  Persistent asthma: Patient does not appear to be actively wheezing on physical exam.  Home medications include Singulair, albuterol, and Symbicort. -Continue Singulair and pharmacy substitution of Dulera  Depression -Continue Celexa  Migraine headaches: Acute on chronic. -Continue topiramate and hydrocodone as needed for headache  Hypothyroidism -Continue levothyroxine  OSA s/p CPAP  -RT to continue CPAP    GI prophylaxis: Pharmacy substitution of Protonix DVT prophylaxis: Lovenox Code Status: Full Family Communication: Husband updated over the phone Disposition Plan: Hopefully discharge home once medically in 4-5 days Consults called: None Admission status: Inpatient  Norval Morton MD [facility] Pager [phone]   If 7PM-7AM, please contact night-coverage [email or web address] Password [facility]  05/28/2020, 7:53 AM

## 2020-05-28 NOTE — Progress Notes (Signed)
Home meds reviewed with patient, bagged, and given to charge RN, Ronalee Belts, for transport to pharmacy.

## 2020-05-28 NOTE — ED Notes (Signed)
Pt has a headache still and shes hot room is not hot  Temp cehckedits ok  Ice pack given for her comfort

## 2020-05-28 NOTE — ED Notes (Signed)
Took pt off O2 support to assess . Pt tolerating room air at this time

## 2020-05-28 NOTE — ED Notes (Signed)
Called and updated husband. Husband verbalized understanding of Covid policy and visitors. Husband states he will leave her bag at the front desk for her.   Bag delivered to pt.

## 2020-05-28 NOTE — ED Provider Notes (Signed)
[facility] DEPARTMENT Provider Note   CSN: [id number] Arrival date & time: 05/27/20  1924     History Chief Complaint  Patient presents with  . Fever    COVID +    Catherine Hancock is a 59 y.o. female.  Patient to ED for evaluation of persistent fever, body aches, headache, SOB, cough since diagnosis with COVID-19 on 05/19/20. Since that time she has had progressive symptoms of SoB, worsening cough and chest tightness. She reports history of asthma. She has been taking her medrol dosepack as prescribed and using her regular medications for asthma but is not getting relief. No vomiting, diarrhea.   The history is provided by the patient. No language interpreter was used.  Fever Associated symptoms: congestion, cough, headaches and myalgias   Associated symptoms: no chills, no diarrhea and no vomiting        Past Medical History:  Diagnosis Date  . Asthma   . Asthma   . Chest pain    LHC 7/08: EF 60%, normal coronaries; ETT-Echo 6/11: EF 55-60%, no ischemia  . Chronic back pain   . CTS (carpal tunnel syndrome)    bil  . CTS (carpal tunnel syndrome)   . Hypothyroidism   . IBS (irritable bowel syndrome)   . Internal hemorrhoids   . Migraines   . OSA on CPAP 11/13/2017   Mild with AHI 13/hr   . Pulmonary nodule, right     Patient Active Problem List   Diagnosis Date Noted  . GERD with stricture 09/26/2019  . OSA on CPAP 11/13/2017  . Pulmonary nodule, right   . Migraines   . Internal hemorrhoids   . IBS (irritable bowel syndrome)   . Hypothyroidism   . CTS (carpal tunnel syndrome)   . Chronic back pain   . Asthma   . Abdominal pain, left upper quadrant 06/09/2012  . Special screening for malignant neoplasms, colon 06/09/2012  . CARDIAC MURMUR 03/18/2010  . PULMONARY NODULE, RIGHT LOWER LOBE 03/20/2008  . SOB 08/17/2007  . COUGH 08/17/2007  . Chest pain of uncertain etiology 83/41/9622    Past Surgical History:  Procedure  Laterality Date  . BREAST EXCISIONAL BIOPSY Right    benign  . breast nodule surg     right breast nodule and left lymph node removed in 1994  . COLONOSCOPY  06/09/2012   Procedure: COLONOSCOPY;  Surgeon: Lafayette Dragon, MD;  Location: [facility];  Service: Endoscopy;  Laterality: N/A;  . EXCISION OF BREAST BIOPSY Left    2 benign lymph nodes removed  . HEMORRHOID SURGERY     2007  . LYMPH NODE BIOPSY     left breast  . NASAL SINUS SURGERY     1992  . TONSILLECTOMY     1981  . TOTAL VAGINAL HYSTERECTOMY       OB History   No obstetric history on file.     Family History  Problem Relation Age of Onset  . Colon cancer Maternal Aunt        x 4  . Colon cancer Maternal Uncle   . Colon cancer Maternal Grandmother   . Heart disease Maternal Grandfather   . Breast cancer Maternal Aunt   . Ovarian cancer Maternal Aunt   . Lymphoma Mother   . Prostate cancer Father   . COPD Father   . Heart failure Father   . Hypertension Father   . Heart disease Paternal Uncle  x 4  . Heart disease Paternal Grandfather   . Mitral valve prolapse Sister     Social History   Tobacco Use  . Smoking status: Never Smoker  . Smokeless tobacco: Never Used  Vaping Use  . Vaping Use: Never used  Substance Use Topics  . Alcohol use: No  . Drug use: No    Home Medications Prior to Admission medications   Medication Sig Start Date End Date Taking? Authorizing Provider  ALPRAZolam Duanne Moron) 0.5 MG tablet Take 0.25 mg by mouth as directed. PRN 11/20/11   [provider]  amoxicillin (AMOXIL) 500 MG tablet Take 500 mg by mouth 3 (three) times daily. 03/03/20   [provider]  aspirin EC 81 MG tablet Take 1 tablet (81 mg total) by mouth daily. 03/18/17   End, Harrell Gave, MD  BIOTIN PO Take 1 tablet by mouth as directed.    [provider]  budesonide-formoterol (SYMBICORT) 80-4.5 MCG/ACT inhaler Inhale 1 puff into the lungs 2 (two) times daily as needed.      [provider]  CALCIUM PO Take 1 tablet by mouth as directed.    [provider]  Cholecalciferol (VITAMIN D3 PO) Take 25,000 Units by mouth once a week.    [provider]  citalopram (CELEXA) 20 MG tablet Take 1 tablet by mouth daily. 11/10/18   [provider]  cyclobenzaprine (FLEXERIL) 10 MG tablet TAKE 1 TABLET THREE TIMES A DAY AS NEEDED FOR MUSCLE SPASMS 03/10/16   Melvenia Beam, MD  DONNATAL 16.2 MG tablet Take 16.2 mg by mouth daily as needed. Stomach pain 02/13/17   [provider]  EPINEPHrine 0.3 mg/0.3 mL IJ SOAJ injection as needed. 05/25/19   [provider]  esomeprazole (NEXIUM) 40 MG capsule Take 40 mg by mouth 2 (two) times daily before a meal.  03/02/17 03/10/20  [provider]  ibuprofen (ADVIL,MOTRIN) 800 MG tablet Take 800 mg by mouth every 8 (eight) hours as needed (pain).    [provider]  montelukast (SINGULAIR) 10 MG tablet Take 10 mg by mouth at bedtime.    [provider]  nitroGLYCERIN (NITROSTAT) 0.4 MG SL tablet Place 1 tablet (0.4 mg total) under the tongue every 5 (five) minutes as needed for chest pain. 03/06/12 11/21/19  Larey Dresser, MD  polyethylene glycol powder (GLYCOLAX/MIRALAX) powder Take 17 g by mouth as needed for mild constipation.     [provider]  Prenat-FeFum-Doc-FA-DHA w/o A (NEXA PLUS) 29-1.25-350 MG CAPS Take 1 tablet by mouth Daily. 11/20/11   [provider]  Prenatal 27-1 MG TABS  05/15/19   [provider]  PROAIR HFA 108 (90 BASE) MCG/ACT inhaler Take 2 puffs by mouth 4 (four) times daily. PRN 11/30/14   [provider]  SYNTHROID 75 MCG tablet Take 75 mcg by mouth daily. 12/11/14   [provider]  topiramate (TOPAMAX) 50 MG tablet Take 50 mg by mouth every morning. Take 100mg  by mouth at night    [provider]  zinc gluconate 50 MG tablet Take 50 mg by mouth daily.    [provider]  zolpidem  (AMBIEN) 10 MG tablet Take 0.5 tablets by mouth at bedtime as needed for sleep.  11/20/11   [provider]    Allergies    Gadolinium derivatives, Isosorbide nitrate, Latex, Other, Fluticasone-salmeterol, Contrast media [iodinated diagnostic agents], and Sulfamethoxazole-trimethoprim  Review of Systems   Review of Systems  Constitutional: Positive for fever. Negative  for chills.  HENT: Positive for congestion.        Loss of taste and smell  Respiratory: Positive for cough, chest tightness and shortness of breath.   Cardiovascular: Negative.   Gastrointestinal: Negative.  Negative for diarrhea and vomiting.  Musculoskeletal: Positive for myalgias.  Skin: Negative.   Neurological: Positive for headaches. Negative for syncope.    Physical Exam Updated Vital Signs BP (!) 135/92 (BP Location: Right Arm)   Pulse 70   Temp 100.3 F (37.9 C) (Oral)   Resp 18   Ht 5' (1.524 m)   Wt 68.9 kg   SpO2 94%   BMI 29.69 kg/m   Physical Exam Vitals and nursing note reviewed.  Constitutional:      General: She is not in acute distress.    Appearance: She is well-developed.  HENT:     Head: Normocephalic.  Cardiovascular:     Rate and Rhythm: Normal rate and regular rhythm.     Heart sounds: No murmur heard.   Pulmonary:     Effort: Pulmonary effort is normal.     Breath sounds: Normal breath sounds.     Comments: Decreased lung sounds on left. Rales bilaterally present. End expiratory wheezes. Actively coughing with inspiration. Abdominal:     General: Bowel sounds are normal.     Palpations: Abdomen is soft.     Tenderness: There is no abdominal tenderness. There is no guarding or rebound.  Musculoskeletal:        General: Normal range of motion.     Cervical back: Normal range of motion and neck supple.  Skin:    General: Skin is warm and dry.     Findings: No rash.  Neurological:     Mental Status: She is alert and oriented to person, place, and time.     ED  Results / Procedures / Treatments   Labs (all labs ordered are listed, but only abnormal results are displayed) Labs Reviewed - No data to display  EKG None  Radiology DG Chest Portable 1 View  Result Date: 05/27/2020 CLINICAL DATA:  COVID-19 positivity with cough and fevers EXAM: PORTABLE CHEST 1 VIEW COMPARISON:  10/30/2019 FINDINGS: Cardiac shadow is within normal limits. Patchy opacities are noted in both lungs. The lungs are hypoinflated. No sizable effusion is seen. No bony abnormality is noted. IMPRESSION: Patchy bilateral airspace opacities consistent with the given clinical history. Electronically Signed   By: Inez Catalina M.D.   On: 05/27/2020 21:35    Procedures Procedures (including critical care time)  Medications Ordered in ED Medications - No data to display  ED Course  I have reviewed the triage vital signs and the nursing notes.  Pertinent labs & imaging results that were available during my care of the patient were reviewed by me and considered in my medical decision making (see chart for details).    MDM Rules/Calculators/A&P                          Patient to ED with ss/sxs as detailed in the HPI, after COVID dx on 7/19. History of severe asthma.   Overall nontoxic in appearance. Persistent coughing, O2 saturations upper 80's to low 90's on 2 L. She become sSOB when talking.   Known COVID but not visible in our system so swab collected in anticipation of admission. Oxygen turned off to verify hypoxia and the patient's O2 dropped to 83%, good wave form. She is already  taking steroids in dosepack form, so decadron is not ordered. Albuterol inhaler (8 puffs) ordered. No tachycardia or pleuritic pain to suggest PE.   She will need to be admitted for further care of COVID infection.  Final Clinical Impression(s) / ED Diagnoses Final diagnoses:  None   1. COVID-19 infection 2. Hypoxia  Rx / DC Orders ED Discharge Orders    None       Dennie Bible 36/06/77 0340    Delora Fuel, MD 35/24/81 [phone]

## 2020-05-29 LAB — CBC WITH DIFFERENTIAL/PLATELET
Abs Immature Granulocytes: 0.29 10*3/uL — ABNORMAL HIGH (ref 0.00–0.07)
Basophils Absolute: 0 10*3/uL (ref 0.0–0.1)
Basophils Relative: 0 %
Eosinophils Absolute: 0 10*3/uL (ref 0.0–0.5)
Eosinophils Relative: 0 %
HCT: 32.8 % — ABNORMAL LOW (ref 36.0–46.0)
Hemoglobin: 10.6 g/dL — ABNORMAL LOW (ref 12.0–15.0)
Immature Granulocytes: 5 %
Lymphocytes Relative: 13 %
Lymphs Abs: 0.8 10*3/uL (ref 0.7–4.0)
MCH: 29.8 pg (ref 26.0–34.0)
MCHC: 32.3 g/dL (ref 30.0–36.0)
MCV: 92.1 fL (ref 80.0–100.0)
Monocytes Absolute: 0.4 10*3/uL (ref 0.1–1.0)
Monocytes Relative: 6 %
Neutro Abs: 4.8 10*3/uL (ref 1.7–7.7)
Neutrophils Relative %: 76 %
Platelets: 252 10*3/uL (ref 150–400)
RBC: 3.56 MIL/uL — ABNORMAL LOW (ref 3.87–5.11)
RDW: 11.7 % (ref 11.5–15.5)
WBC: 6.3 10*3/uL (ref 4.0–10.5)
nRBC: 0 % (ref 0.0–0.2)

## 2020-05-29 LAB — C-REACTIVE PROTEIN: CRP: 17.4 mg/dL — ABNORMAL HIGH (ref <1.0)

## 2020-05-29 LAB — COMPREHENSIVE METABOLIC PANEL
ALT: 26 U/L (ref 0–44)
AST: 36 U/L (ref 15–41)
Albumin: 2.6 g/dL — ABNORMAL LOW (ref 3.5–5.0)
Alkaline Phosphatase: 91 U/L (ref 38–126)
Anion gap: 10 (ref 5–15)
BUN: 17 mg/dL (ref 6–20)
CO2: 24 mmol/L (ref 22–32)
Calcium: 9 mg/dL (ref 8.9–10.3)
Chloride: 107 mmol/L (ref 98–111)
Creatinine, Ser: 0.59 mg/dL (ref 0.44–1.00)
GFR calc Af Amer: >60 mL/min (ref >60)
GFR calc non Af Amer: >60 mL/min (ref >60)
Glucose, Bld: 117 mg/dL — ABNORMAL HIGH (ref 70–99)
Potassium: 3.9 mmol/L (ref 3.5–5.1)
Sodium: 141 mmol/L (ref 135–145)
Total Bilirubin: 0.4 mg/dL (ref 0.3–1.2)
Total Protein: 6.6 g/dL (ref 6.5–8.1)

## 2020-05-29 LAB — D-DIMER, QUANTITATIVE: D-Dimer, Quant: 0.68 ug/mL-FEU — ABNORMAL HIGH (ref 0.00–0.50)

## 2020-05-29 LAB — FERRITIN: Ferritin: 415 ng/mL — ABNORMAL HIGH (ref 11–307)

## 2020-05-29 LAB — MAGNESIUM: Magnesium: 2.3 mg/dL (ref 1.7–2.4)

## 2020-05-29 LAB — MRSA PCR SCREENING: MRSA by PCR: NEGATIVE

## 2020-05-29 LAB — HEPATITIS B SURFACE ANTIGEN: Hepatitis B Surface Ag: NONREACTIVE

## 2020-05-29 LAB — PHOSPHORUS: Phosphorus: 3.9 mg/dL (ref 2.5–4.6)

## 2020-05-29 MED ORDER — TOCILIZUMAB 400 MG/20ML IV SOLN
600.0000 mg | Freq: Once | INTRAVENOUS | Status: AC
  Administered 2020-05-29: 600 mg via INTRAVENOUS
  Filled 2020-05-29 (×2): qty 30

## 2020-05-29 MED ORDER — METHYLPREDNISOLONE SODIUM SUCC 40 MG IJ SOLR
40.0000 mg | Freq: Two times a day (BID) | INTRAMUSCULAR | Status: DC
  Administered 2020-05-29 – 2020-06-02 (×9): 40 mg via INTRAVENOUS
  Filled 2020-05-29 (×9): qty 1

## 2020-05-29 MED ORDER — BENZONATATE 100 MG PO CAPS
200.0000 mg | ORAL_CAPSULE | Freq: Three times a day (TID) | ORAL | Status: DC
  Administered 2020-05-29 – 2020-06-03 (×15): 200 mg via ORAL
  Filled 2020-05-29 (×16): qty 2

## 2020-05-29 NOTE — Progress Notes (Signed)
Worsening hypoxia-up to 5-6 L of oxygen-claims that she gets very with shortness of breath when she just takes a few steps.  CRP still significantly elevated.  Talk to patient about Actemra use-risks, benefits, rationale discussed with patient in detail-subsequently talked with the husband-all understand rationale/risks/benefits of Actemra-and agree to proceed with Actemra infusion.

## 2020-05-29 NOTE — Progress Notes (Signed)
Occupational Therapy Evaluation Patient Details Name: Catherine Hancock MRN: [id number] DOB: 04-Mar-1961 Today's Date: 05/29/2020    History of Present Illness Patient is a 59 y.o. female with PMHx of bronchial asthma, IBS, migraine headaches-who was diagnosed with COVID-19 on 7/19-presented with worsening shortness of breath-found to have acute hypoxic respiratory failure secondary to COVID-19 pneumonia.    Clinical Impression   Patient is independent at baseline and lives at home with spouse.  She primarily presents with decreased activity tolerance.  Patient also experiencing some dizziness/nausea during session limiting standing activity.  Able to get to EOB with supervision and stand with min guard.  Can complete UB ADLs with supervision and LB with min guard.  She was on 5L O2 and SpO2 99 at rest though decreased to 88 with mobility.  Patient would benefit from further acute OT to work on building activity tolerance for increase independence and safety with ADLs.    BP: Laying- 115/63 Sitting- 111/60 Stand 0 min- 108/61 Stand 3 min- 103/67    Follow Up Recommendations  Home health OT;Supervision - Intermittent (may progress to no follow up)    Equipment Recommendations  None recommended by OT    Recommendations for Other Services       Precautions / Restrictions Precautions Precautions: Fall Restrictions Weight Bearing Restrictions: No      Mobility Bed Mobility Overal bed mobility: Needs Assistance Bed Mobility: Supine to Sit     Supine to sit: Supervision        Transfers Overall transfer level: Needs assistance Equipment used: None Transfers: Sit to/from Stand;Stand Pivot Transfers Sit to Stand: Min guard Stand pivot transfers: Min guard       General transfer comment: Min guard due to dizziness    Balance Overall balance assessment: Mild deficits observed, not formally tested                                         ADL either  performed or assessed with clinical judgement   ADL Overall ADL's : Needs assistance/impaired Eating/Feeding: Independent;Sitting   Grooming: Supervision/safety;Standing   Upper Body Bathing: Standing;Supervision/ safety   Lower Body Bathing: Min guard;Sit to/from stand   Upper Body Dressing : Supervision/safety;Sitting   Lower Body Dressing: Min guard;Sit to/from stand   Toilet Transfer: Min guard;Ambulation;Regular Museum/gallery exhibitions officer and Hygiene: Sitting/lateral lean;Supervision/safety       Functional mobility during ADLs: Min guard General ADL Comments: Has some dizziness with sitting/standing causing nausea     Vision         Perception     Praxis      Pertinent Vitals/Pain Pain Assessment: Faces Faces Pain Scale: Hurts a little bit Pain Location: discomfort due to nausea Pain Descriptors / Indicators: Discomfort Pain Intervention(s): Monitored during session     Hand Dominance Right   Extremity/Trunk Assessment Upper Extremity Assessment Upper Extremity Assessment: Generalized weakness   Lower Extremity Assessment Lower Extremity Assessment: Defer to PT evaluation       Communication Communication Communication: No difficulties   Cognition Arousal/Alertness: Awake/alert Behavior During Therapy: WFL for tasks assessed/performed Overall Cognitive Status: Within Functional Limits for tasks assessed                                     General Comments  See clinical  impression for orthostatics    Exercises     Shoulder Instructions      Home Living Family/patient expects to be discharged to:: Private residence Living Arrangements: Spouse/significant other Available Help at Discharge: Family Type of Home: House Home Access: Stairs to enter [employer] of Steps: 2 Entrance Stairs-Rails: None Home Layout: Multi-level;Able to live on main level with bedroom/bathroom     Bathroom Shower/Tub:  Tub/shower unit;Walk-in shower (walk in upstairs)     Bathroom Accessibility: Yes   Home Equipment: None          Prior Functioning/Environment Level of Independence: Independent                 OT Problem List: Decreased strength;Decreased activity tolerance;Cardiopulmonary status limiting activity      OT Treatment/Interventions: Self-care/ADL training;Therapeutic exercise;Energy conservation;Therapeutic activities;Patient/family education    OT Goals(Current goals can be found in the care plan section) Acute Rehab OT Goals Patient Stated Goal: to go home OT Goal Formulation: With patient Time For Goal Achievement: 06/12/20 Potential to Achieve Goals: Good  OT Frequency: Min 2X/week   Barriers to D/C:            Co-evaluation              AM-PAC OT "6 Clicks" Daily Activity     Outcome Measure Help from another person eating meals?: None Help from another person taking care of personal grooming?: A Little Help from another person toileting, which includes using toliet, bedpan, or urinal?: A Little Help from another person bathing (including washing, rinsing, drying)?: A Little Help from another person to put on and taking off regular upper body clothing?: A Little Help from another person to put on and taking off regular lower body clothing?: A Little 6 Click Score: 19   End of Session Nurse Communication: Mobility status  Activity Tolerance: Patient tolerated treatment well;Treatment limited secondary to medical complications (Comment) (some limitations due to dizziness) Patient left: in chair;with call bell/phone within reach  OT Visit Diagnosis: Muscle weakness (generalized) (M62.81);Other (comment) (decreased activity tolerance)                Time: 7948-0165 OT Time Calculation (min): 26 min Charges:  OT General Charges $OT Visit: 1 Visit OT Evaluation $OT Eval Moderate Complexity: 1 Mod OT Treatments $Therapeutic Activity: 8-22  mins  August Luz, OTR/L   [name] 05/29/2020, 3:22 PM

## 2020-05-29 NOTE — Progress Notes (Signed)
Patient is COVID +, CPAP not being used at this time.

## 2020-05-29 NOTE — Progress Notes (Signed)
PROGRESS NOTE                                                                                                                                                                                                             Patient Demographics:    Catherine Hancock, is a 59 y.o. female, DOB - [birth year], [id number]  Outpatient Primary MD for the patient is Tobe Sos, MD   Admit date - 05/27/2020   LOS - 1  Chief Complaint  Patient presents with  . Fever    COVID +       Brief Narrative: Patient is a 59 y.o. female with PMHx of bronchial asthma, IBS, migraine headaches-who was diagnosed with COVID-19 on 7/19-presented with worsening shortness of breath-found to have acute hypoxic respiratory failure secondary to COVID-19 pneumonia.  See below for further details.  Significant Events: 7/28>> admit for shortness of breath-found to have COVID-19 pneumonia and hypoxia 7/29>> worsening hypoxia-up to 5 L of oxygen  Significant studies: 7/27>> chest x-ray: Patchy bilateral airspace disease 3/8>> echo: EF 60-65%  COVID-19 medications: Steroids: 7/28>> Remdesivir: 7/28>> Actemra: 7/29 x 1  Antibiotics: None  Microbiology data: 7/28>> blood culture: Negative  Procedures: None  Consults: None  DVT prophylaxis: enoxaparin (LOVENOX) injection 40 mg Start: 05/28/20 1000     Subjective:    [name] today continues to have coughing spells-claims that she gets short of breath with minimal activity.  Requiring around 5-6 L of oxygen this morning.   Assessment  & Plan :   Acute Hypoxic Resp Failure due to Covid 19 Viral pneumonia: Worsening-increasing oxygen requirement this morning-continue steroids/remdesivir-discussed about Actemra with patient and husband.  Risks/benefits rationale discussed in detail-no history of TB, hepatitis B, chronic diverticulitis-potential risk of bacterial/opportunistic infections  discussed-patient and family agreeable-we will give 1 dose of Actemra.  Watch closely-continue supportive care.  Fever: afebrile  O2 requirements:  SpO2: 93 % O2 Flow Rate (L/min): 5 L/min   COVID-19 Labs: Recent Labs    05/28/20 0757 05/29/20 0317  DDIMER 0.84* 0.68*  FERRITIN 351* 415*  LDH 287*  --   CRP 19.6* 17.4*    No results found for: BNP  Recent Labs  Lab 05/28/20 0757  PROCALCITON <0.10    Lab Results  Component Value Date   SARSCOV2NAA POSITIVE (A) 05/28/2020   SARSCOV2NAA NEGATIVE  02/15/2020     Prone/Incentive Spirometry: encouraged patient to lie prone for 3-4 hours at a time for a total of 16 hours a day, and to encourage incentive spirometry use 3-4/hour.  Bronchial asthma: Not in exacerbation-continue inhaler regimen.  Depression: Stable-continue Celexa  Migraine headaches: Currently headache free-continue Topamax.  Hypothyroidism: Continue levothyroxine  OSA: Continue CPAP nightly  ABG: No results found for: PHART, PCO2ART, PO2ART, HCO3, TCO2, ACIDBASEDEF, O2SAT  Vent Settings: N/A  Condition - Extremely Guarded-  Family Communication  :  Spouse updated over the phone  Code Status :  Full Code/  Diet :  Diet Order            Diet regular Room service appropriate? Yes; Fluid consistency: Thin  Diet effective now                  Disposition Plan  :   Status is: Inpatient  Remains inpatient appropriate because:Inpatient level of care appropriate due to severity of illness   Dispo: The patient is from: Home              Anticipated d/c is to: Home              Anticipated d/c date is: > 3 days              Patient currently is not medically stable to d/c.  Barriers to discharge: Hypoxia requiring O2 supplementation/complete 5 days of IV Remdesivir  Antimicorbials  :    Anti-infectives (From admission, onward)   Start     Dose/Rate Route Frequency Ordered Stop   05/29/20 1000  remdesivir 100 mg in sodium chloride 0.9 %  100 mL IVPB     Discontinue    "Followed by" Linked Group Details   100 mg 200 mL/hr over 30 Minutes Intravenous Daily 05/28/20 0804 06/02/20 0959   05/28/20 0900  remdesivir 200 mg in sodium chloride 0.9% 250 mL IVPB       "Followed by" Linked Group Details   200 mg 580 mL/hr over 30 Minutes Intravenous Once 05/28/20 0804 05/28/20 1059      Inpatient Medications  Scheduled Meds: . albuterol  2 puff Inhalation Q6H  . vitamin C  500 mg Oral Daily  . aspirin EC  81 mg Oral Daily  . benzonatate  200 mg Oral TID  . citalopram  20 mg Oral Daily  . cyclobenzaprine  10 mg Oral QHS  . enoxaparin (LOVENOX) injection  40 mg Subcutaneous Q24H  . levothyroxine  75 mcg Oral Q0600  . methylPREDNISolone (SOLU-MEDROL) injection  40 mg Intravenous BID  . mometasone-formoterol  2 puff Inhalation BID  . montelukast  10 mg Oral QHS  . pantoprazole  40 mg Oral BID  . sodium chloride flush  3 mL Intravenous Q12H  . topiramate  100 mg Oral QHS  . topiramate  50 mg Oral Daily  . zinc sulfate  220 mg Oral Daily   Continuous Infusions: . remdesivir 100 mg in NS 100 mL 100 mg (05/29/20 0922)  . tocilizumab (ACTEMRA) - non-COVID treatment 600 mg (05/29/20 1141)   PRN Meds:.acetaminophen, ALPRAZolam, belladona alk-PHENObarbital, chlorpheniramine-HYDROcodone, guaiFENesin-dextromethorphan, HYDROcodone-acetaminophen, nitroGLYCERIN, ondansetron **OR** ondansetron (ZOFRAN) IV, polyethylene glycol, zolpidem   Time Spent in minutes  35   See all Orders from today for further details   Oren Binet M.D on 05/29/2020 at 12:04 PM  To page go to [email or web address] - use universal password  [facility] -  Office  [phone]  Objective:   Vitals:   05/29/20 0400 05/29/20 0726 05/29/20 1132 05/29/20 1139  BP: (!) 113/57 (!) 110/60 (!) 118/62 (!) 122/63  Pulse: 59 57 67 66  Resp: 21 23 (!) 26 21  Temp: 98.7 F (37.1 C) 97.8 F (36.6 C) 97.9 F (36.6 C) 97.8 F (36.6 C)  TempSrc: Axillary  Oral Oral Oral  SpO2: 94% 90% 90% 93%  Weight:      Height:        Wt Readings from Last 3 Encounters:  05/28/20 68.9 kg  01/14/20 68.9 kg  12/10/19 69.1 kg     Intake/Output Summary (Last 24 hours) at 05/29/2020 1204 Last data filed at 05/29/2020 1141 Gross per 24 hour  Intake 620 ml  Output --  Net 620 ml     Physical Exam Gen Exam:Alert awake-not in any distress HEENT:atraumatic, normocephalic Chest: B/L clear to auscultation anteriorly CVS:S1S2 regular Abdomen:soft non tender, non distended Extremities:no edema Neurology: Non focal Skin: no rash   Data Review:    CBC Recent Labs  Lab 05/28/20 0536 05/29/20 0317  WBC 8.7 6.3  HGB 11.4* 10.6*  HCT 35.2* 32.8*  PLT 236 252  MCV 91.7 92.1  MCH 29.7 29.8  MCHC 32.4 32.3  RDW 11.7 11.7  LYMPHSABS 0.8 0.8  MONOABS 0.3 0.4  EOSABS 0.0 0.0  BASOSABS 0.0 0.0    Chemistries  Recent Labs  Lab 05/28/20 0536 05/29/20 0317  NA 137 141  K 4.0 3.9  CL 103 107  CO2 24 24  GLUCOSE 112* 117*  BUN 21* 17  CREATININE 0.73 0.59  CALCIUM 9.0 9.0  MG  --  2.3  AST  --  36  ALT  --  26  ALKPHOS  --  91  BILITOT  --  0.4   ------------------------------------------------------------------------------------------------------------------ Recent Labs    05/28/20 0758  TRIG 123    No results found for: HGBA1C ------------------------------------------------------------------------------------------------------------------ No results for input(s): TSH, T4TOTAL, T3FREE, THYROIDAB in the last 72 hours.  Invalid input(s): FREET3 ------------------------------------------------------------------------------------------------------------------ Recent Labs    05/28/20 0757 05/29/20 0317  FERRITIN 351* 415*    Coagulation profile No results for input(s): INR, PROTIME in the last 168 hours.  Recent Labs    05/28/20 0757 05/29/20 0317  DDIMER 0.84* 0.68*    Cardiac Enzymes No results for input(s):  CKMB, TROPONINI, MYOGLOBIN in the last 168 hours.  Invalid input(s): CK ------------------------------------------------------------------------------------------------------------------ No results found for: BNP  Micro Results Recent Results (from the past 240 hour(s))  SARS Coronavirus 2 by RT PCR (hospital order, performed in [facility] hospital lab) Nasopharyngeal Nasopharyngeal Swab     Status: Abnormal   Collection Time: 05/28/20  5:36 AM   Specimen: Nasopharyngeal Swab  Result Value Ref Range Status   SARS Coronavirus 2 POSITIVE (A) NEGATIVE Final    Comment: RESULT CALLED TO, READ BACK BY AND VERIFIED WITH: RN MEGAN R. BY MESSAN H. AT 2094 ON 05/28/2020 (NOTE) SARS-CoV-2 target nucleic acids are DETECTED  SARS-CoV-2 RNA is generally detectable in upper respiratory specimens  during the acute phase of infection.  Positive results are indicative  of the presence of the identified virus, but do not rule out bacterial infection or co-infection with other pathogens not detected by the test.  Clinical correlation with patient history and  other diagnostic information is necessary to determine patient infection status.  The expected result is negative.  Fact Sheet for Patients:   StrictlyIdeas.no   Fact Sheet for Healthcare Providers:   BankingDealers.co.za  This test is not yet approved or cleared by the [place] and  has been authorized for detection and/or diagnosis of SARS-CoV-2 by FDA under an Emergency Use Authorization (EUA).  This EUA will remain in effect (mean ing this test can be used) for the duration of  the COVID-19 declaration under Section 564(b)(1) of the Act, 21 U.S.C. section 360-bbb-3(b)(1), unless the authorization is terminated or revoked sooner.  Performed at [facility], [place] [address], [place], [place] [address]   Blood Culture (routine x 2)     Status: None (Preliminary result)    Collection Time: 05/28/20  7:57 AM   Specimen: BLOOD  Result Value Ref Range Status   Specimen Description BLOOD LEFT ANTECUBITAL  Final   Special Requests   Final    BOTTLES DRAWN AEROBIC AND ANAEROBIC Blood Culture adequate volume   Culture   Final    NO GROWTH 1 DAY Performed at [facility], [place] [address], [place], [place] [address]    Report Status PENDING  Incomplete  Blood Culture (routine x 2)     Status: None (Preliminary result)   Collection Time: 05/28/20  8:05 AM   Specimen: BLOOD  Result Value Ref Range Status   Specimen Description BLOOD SITE NOT SPECIFIED  Final   Special Requests   Final    BOTTLES DRAWN AEROBIC AND ANAEROBIC Blood Culture results may not be optimal due to an excessive volume of blood received in culture bottles   Culture   Final    NO GROWTH 1 DAY Performed at [facility], [place] [address], [place], [place] [address]    Report Status PENDING  Incomplete  MRSA PCR Screening     Status: None   Collection Time: 05/28/20 11:51 PM   Specimen: Nasal Mucosa; Nasopharyngeal  Result Value Ref Range Status   MRSA by PCR NEGATIVE NEGATIVE Final    Comment:        The GeneXpert MRSA Assay (FDA approved for NASAL specimens only), is one component of a comprehensive MRSA colonization surveillance program. It is not intended to diagnose MRSA infection nor to guide or monitor treatment for MRSA infections. Performed at [facility], [place] [address], [place], [place] [address]     Radiology Reports DG Chest Portable 1 View  Result Date: 05/27/2020 CLINICAL DATA:  COVID-19 positivity with cough and fevers EXAM: PORTABLE CHEST 1 VIEW COMPARISON:  10/30/2019 FINDINGS: Cardiac shadow is within normal limits. Patchy opacities are noted in both lungs. The lungs are hypoinflated. No sizable effusion is seen. No bony abnormality is noted. IMPRESSION: Patchy bilateral airspace opacities consistent with the given clinical history. Electronically  Signed   By: Inez Catalina M.D.   On: 05/27/2020 21:35

## 2020-05-30 LAB — COMPREHENSIVE METABOLIC PANEL
ALT: 22 U/L (ref 0–44)
AST: 30 U/L (ref 15–41)
Albumin: 2.5 g/dL — ABNORMAL LOW (ref 3.5–5.0)
Alkaline Phosphatase: 86 U/L (ref 38–126)
Anion gap: 11 (ref 5–15)
BUN: 16 mg/dL (ref 6–20)
CO2: 24 mmol/L (ref 22–32)
Calcium: 9 mg/dL (ref 8.9–10.3)
Chloride: 108 mmol/L (ref 98–111)
Creatinine, Ser: 0.63 mg/dL (ref 0.44–1.00)
GFR calc Af Amer: >60 mL/min (ref >60)
GFR calc non Af Amer: >60 mL/min (ref >60)
Glucose, Bld: 133 mg/dL — ABNORMAL HIGH (ref 70–99)
Potassium: 3.7 mmol/L (ref 3.5–5.1)
Sodium: 143 mmol/L (ref 135–145)
Total Bilirubin: 0.5 mg/dL (ref 0.3–1.2)
Total Protein: 6.4 g/dL — ABNORMAL LOW (ref 6.5–8.1)

## 2020-05-30 LAB — C-REACTIVE PROTEIN: CRP: 7 mg/dL — ABNORMAL HIGH (ref <1.0)

## 2020-05-30 LAB — CBC WITH DIFFERENTIAL/PLATELET
Abs Immature Granulocytes: 0.38 10*3/uL — ABNORMAL HIGH (ref 0.00–0.07)
Basophils Absolute: 0 10*3/uL (ref 0.0–0.1)
Basophils Relative: 0 %
Eosinophils Absolute: 0 10*3/uL (ref 0.0–0.5)
Eosinophils Relative: 0 %
HCT: 33.7 % — ABNORMAL LOW (ref 36.0–46.0)
Hemoglobin: 10.8 g/dL — ABNORMAL LOW (ref 12.0–15.0)
Immature Granulocytes: 7 %
Lymphocytes Relative: 10 %
Lymphs Abs: 0.5 10*3/uL — ABNORMAL LOW (ref 0.7–4.0)
MCH: 29.5 pg (ref 26.0–34.0)
MCHC: 32 g/dL (ref 30.0–36.0)
MCV: 92.1 fL (ref 80.0–100.0)
Monocytes Absolute: 0.3 10*3/uL (ref 0.1–1.0)
Monocytes Relative: 6 %
Neutro Abs: 4.2 10*3/uL (ref 1.7–7.7)
Neutrophils Relative %: 77 %
Platelets: 301 10*3/uL (ref 150–400)
RBC: 3.66 MIL/uL — ABNORMAL LOW (ref 3.87–5.11)
RDW: 11.6 % (ref 11.5–15.5)
WBC: 5.4 10*3/uL (ref 4.0–10.5)
nRBC: 0 % (ref 0.0–0.2)

## 2020-05-30 LAB — FERRITIN: Ferritin: 545 ng/mL — ABNORMAL HIGH (ref 11–307)

## 2020-05-30 LAB — D-DIMER, QUANTITATIVE: D-Dimer, Quant: 0.48 ug/mL-FEU (ref 0.00–0.50)

## 2020-05-30 MED ORDER — PROMETHAZINE HCL 25 MG/ML IJ SOLN
25.0000 mg | Freq: Four times a day (QID) | INTRAMUSCULAR | Status: DC | PRN
  Filled 2020-05-30: qty 1

## 2020-05-30 NOTE — Progress Notes (Signed)
PROGRESS NOTE                                                                                                                                                                                                             Patient Demographics:    Catherine Hancock, is a 59 y.o. female, DOB - 05-15-1961, [id number]  Outpatient Primary MD for the patient is Tobe Sos, MD   Admit date - 05/27/2020   LOS - 2  Chief Complaint  Patient presents with   Fever    COVID +       Brief Narrative: Patient is a 59 y.o. female with PMHx of bronchial asthma, IBS, migraine headaches-who was diagnosed with COVID-19 on 7/19-presented with worsening shortness of breath-found to have acute hypoxic respiratory failure secondary to COVID-19 pneumonia.  See below for further details.  Significant Events: 7/28>> admit for shortness of breath-found to have COVID-19 pneumonia and hypoxia 7/29>> worsening hypoxia-up to 5 L of oxygen  Significant studies: 7/27>> chest x-ray: Patchy bilateral airspace disease 3/8>> echo: EF 60-65%  COVID-19 medications: Steroids: 7/28>> Remdesivir: 7/28>> Actemra: 7/29 x 1  Antibiotics: None  Microbiology data: 7/28>> blood culture: Negative  Procedures: None  Consults: None  DVT prophylaxis: enoxaparin (LOVENOX) injection 40 mg Start: 05/28/20 1000     Subjective:   Nauseous-thinks breathing slightly better-still remains on around 5 L of oxygen this morning.  Claims that she felt jittery/hyperactive whole night   Assessment  & Plan :   Acute Hypoxic Resp Failure due to Covid 19 Viral pneumonia: Remains essentially unchanged-requiring around 5 L of oxygen-CRP is down trended-remains on steroids/remdesivir-did receive Actemra on 7/29.  Continue with attempts to titrate down FiO2-continue supportive care.    Fever: afebrile  O2 requirements:  SpO2: 98 % O2 Flow Rate (L/min): 5 L/min    COVID-19 Labs: Recent Labs    05/28/20 0757 05/29/20 0317 05/30/20 0508  DDIMER 0.84* 0.68* 0.48  FERRITIN 351* 415* 545*  LDH 287*  --   --   CRP 19.6* 17.4* 7.0*    No results found for: BNP  Recent Labs  Lab 05/28/20 0757  PROCALCITON <0.10    Lab Results  Component Value Date   SARSCOV2NAA POSITIVE (A) 05/28/2020   [place] NEGATIVE 02/15/2020     Prone/Incentive Spirometry: encouraged patient to lie prone for  3-4 hours at a time for a total of 16 hours a day, and to encourage incentive spirometry use 3-4/hour.  Bronchial asthma: Not in exacerbation-continue inhaler regimen.  Depression: Stable-continue Celexa  Migraine headaches: Currently headache free-continue Topamax.  Hypothyroidism: Continue levothyroxine  OSA: Continue CPAP nightly  ABG: No results found for: PHART, PCO2ART, PO2ART, HCO3, TCO2, ACIDBASEDEF, O2SAT  Vent Settings: N/A  Condition - Extremely Guarded  Family Communication  :    Code Status :  Full Code  Diet :  Diet Order            Diet regular Room service appropriate? Yes; Fluid consistency: Thin  Diet effective now                  Disposition Plan  :   Status is: Inpatient  Remains inpatient appropriate because:Inpatient level of care appropriate due to severity of illness   Dispo: The patient is from: Home              Anticipated d/c is to: Home              Anticipated d/c date is: > 3 days              Patient currently is not medically stable to d/c.  Barriers to discharge: Hypoxia requiring O2 supplementation/complete 5 days of IV Remdesivir  Antimicorbials  :    Anti-infectives (From admission, onward)   Start     Dose/Rate Route Frequency Ordered Stop   05/29/20 1000  remdesivir 100 mg in sodium chloride 0.9 % 100 mL IVPB     Discontinue    "Followed by" Linked Group Details   100 mg 200 mL/hr over 30 Minutes Intravenous Daily 05/28/20 0804 06/02/20 0959   05/28/20 0900  remdesivir 200 mg in  sodium chloride 0.9% 250 mL IVPB       "Followed by" Linked Group Details   200 mg 580 mL/hr over 30 Minutes Intravenous Once 05/28/20 0804 05/28/20 1059      Inpatient Medications  Scheduled Meds:  albuterol  2 puff Inhalation Q6H   vitamin C  500 mg Oral Daily   aspirin EC  81 mg Oral Daily   benzonatate  200 mg Oral TID   citalopram  20 mg Oral Daily   cyclobenzaprine  10 mg Oral QHS   enoxaparin (LOVENOX) injection  40 mg Subcutaneous Q24H   levothyroxine  75 mcg Oral Q0600   methylPREDNISolone (SOLU-MEDROL) injection  40 mg Intravenous BID   mometasone-formoterol  2 puff Inhalation BID   montelukast  10 mg Oral QHS   pantoprazole  40 mg Oral BID   sodium chloride flush  3 mL Intravenous Q12H   topiramate  100 mg Oral QHS   topiramate  50 mg Oral Daily   zinc sulfate  220 mg Oral Daily   Continuous Infusions:  remdesivir 100 mg in NS 100 mL 100 mg (05/30/20 1210)   PRN Meds:.acetaminophen, ALPRAZolam, belladona alk-PHENObarbital, chlorpheniramine-HYDROcodone, guaiFENesin-dextromethorphan, HYDROcodone-acetaminophen, nitroGLYCERIN, ondansetron **OR** ondansetron (ZOFRAN) IV, polyethylene glycol, promethazine, zolpidem   Time Spent in minutes  35   See all Orders from today for further details   Oren Binet M.D on 05/30/2020 at 1:43 PM  To page go to [email or web address] - use universal password  [facility] -  Office  [phone]    Objective:   Vitals:   05/29/20 2140 05/30/20 0537 05/30/20 0900 05/30/20 1300  BP: (!) 132/51 (!) 130/62 (!) 122/62 (!) 108/53  Pulse:  58 48 68 66  Resp: 22 19 21 20   Temp: 98.2 F (36.8 C) 97.7 F (36.5 C) 97.7 F (36.5 C) 98 F (36.7 C)  TempSrc: Oral Oral Oral Oral  SpO2: 90% 100% 94% 98%  Weight:      Height:        Wt Readings from Last 3 Encounters:  05/28/20 68.9 kg  01/14/20 68.9 kg  12/10/19 69.1 kg     Intake/Output Summary (Last 24 hours) at 05/30/2020 1343 Last data filed at 05/30/2020  1142 Gross per 24 hour  Intake 523 ml  Output --  Net 523 ml     Physical Exam Gen Exam:Alert awake-not in any distress HEENT:atraumatic, normocephalic Chest: B/L clear to auscultation anteriorly CVS:S1S2 regular Abdomen:soft non tender, non distended Extremities:no edema Neurology: Non focal Skin: no rash   Data Review:    CBC Recent Labs  Lab 05/28/20 0536 05/29/20 0317 05/30/20 0508  WBC 8.7 6.3 5.4  HGB 11.4* 10.6* 10.8*  HCT 35.2* 32.8* 33.7*  PLT 236 252 301  MCV 91.7 92.1 92.1  MCH 29.7 29.8 29.5  MCHC 32.4 32.3 32.0  RDW 11.7 11.7 11.6  LYMPHSABS 0.8 0.8 0.5*  MONOABS 0.3 0.4 0.3  EOSABS 0.0 0.0 0.0  BASOSABS 0.0 0.0 0.0    Chemistries  Recent Labs  Lab 05/28/20 0536 05/29/20 0317 05/30/20 0508  NA 137 141 143  K 4.0 3.9 3.7  CL 103 107 108  CO2 24 24 24   GLUCOSE 112* 117* 133*  BUN 21* 17 16  CREATININE 0.73 0.59 0.63  CALCIUM 9.0 9.0 9.0  MG  --  2.3  --   AST  --  36 30  ALT  --  26 22  ALKPHOS  --  91 86  BILITOT  --  0.4 0.5   ------------------------------------------------------------------------------------------------------------------ Recent Labs    05/28/20 0758  TRIG 123    No results found for: HGBA1C ------------------------------------------------------------------------------------------------------------------ No results for input(s): TSH, T4TOTAL, T3FREE, THYROIDAB in the last 72 hours.  Invalid input(s): FREET3 ------------------------------------------------------------------------------------------------------------------ Recent Labs    05/29/20 0317 05/30/20 0508  FERRITIN 415* 545*    Coagulation profile No results for input(s): INR, PROTIME in the last 168 hours.  Recent Labs    05/29/20 0317 05/30/20 0508  DDIMER 0.68* 0.48    Cardiac Enzymes No results for input(s): CKMB, TROPONINI, MYOGLOBIN in the last 168 hours.  Invalid input(s):  CK ------------------------------------------------------------------------------------------------------------------ No results found for: BNP  Micro Results Recent Results (from the past 240 hour(s))  SARS Coronavirus 2 by RT PCR (hospital order, performed in [facility] hospital lab) Nasopharyngeal Nasopharyngeal Swab     Status: Abnormal   Collection Time: 05/28/20  5:36 AM   Specimen: Nasopharyngeal Swab  Result Value Ref Range Status   SARS Coronavirus 2 POSITIVE (A) NEGATIVE Final    Comment: RESULT CALLED TO, READ BACK BY AND VERIFIED WITH: RN MEGAN R. BY MESSAN H. AT 2641 ON 05/28/2020 (NOTE) SARS-CoV-2 target nucleic acids are DETECTED  SARS-CoV-2 RNA is generally detectable in upper respiratory specimens  during the acute phase of infection.  Positive results are indicative  of the presence of the identified virus, but do not rule out bacterial infection or co-infection with other pathogens not detected by the test.  Clinical correlation with patient history and  other diagnostic information is necessary to determine patient infection status.  The expected result is negative.  Fact Sheet for Patients:   StrictlyIdeas.no   Fact Sheet for Healthcare  Providers:   BankingDealers.co.za    This test is not yet approved or cleared by the [place] and  has been authorized for detection and/or diagnosis of SARS-CoV-2 by FDA under an Emergency Use Authorization (EUA).  This EUA will remain in effect (mean ing this test can be used) for the duration of  the COVID-19 declaration under Section 564(b)(1) of the Act, 21 U.S.C. section 360-bbb-3(b)(1), unless the authorization is terminated or revoked sooner.  Performed at [facility], [place] [address] [place], [place] [address]   Blood Culture (routine x 2)     Status: None (Preliminary result)   Collection Time: 05/28/20  7:57 AM   Specimen: BLOOD  Result Value Ref  Range Status   Specimen Description BLOOD LEFT ANTECUBITAL  Final   Special Requests   Final    BOTTLES DRAWN AEROBIC AND ANAEROBIC Blood Culture adequate volume   Culture   Final    NO GROWTH 1 DAY Performed at [facility], [place] [address] [place], [place] [address]    Report Status PENDING  Incomplete  Blood Culture (routine x 2)     Status: None (Preliminary result)   Collection Time: 05/28/20  8:05 AM   Specimen: BLOOD  Result Value Ref Range Status   Specimen Description BLOOD SITE NOT SPECIFIED  Final   Special Requests   Final    BOTTLES DRAWN AEROBIC AND ANAEROBIC Blood Culture results may not be optimal due to an excessive volume of blood received in culture bottles   Culture   Final    NO GROWTH 1 DAY Performed at [facility], [place] [address], [place], [place] [address]    Report Status PENDING  Incomplete  MRSA PCR Screening     Status: None   Collection Time: 05/28/20 11:51 PM   Specimen: Nasal Mucosa; Nasopharyngeal  Result Value Ref Range Status   MRSA by PCR NEGATIVE NEGATIVE Final    Comment:        The GeneXpert MRSA Assay (FDA approved for NASAL specimens only), is one component of a comprehensive MRSA colonization surveillance program. It is not intended to diagnose MRSA infection nor to guide or monitor treatment for MRSA infections. Performed at [facility], [place] [address] [place],  [address]     Radiology Reports DG Chest Portable 1 View  Result Date: 05/27/2020 CLINICAL DATA:  COVID-19 positivity with cough and fevers EXAM: PORTABLE CHEST 1 VIEW COMPARISON:  10/30/2019 FINDINGS: Cardiac shadow is within normal limits. Patchy opacities are noted in both lungs. The lungs are hypoinflated. No sizable effusion is seen. No bony abnormality is noted. IMPRESSION: Patchy bilateral airspace opacities consistent with the given clinical history. Electronically Signed   By: Inez Catalina M.D.   On: 05/27/2020 21:35

## 2020-05-30 NOTE — Evaluation (Signed)
Physical Therapy Evaluation Patient Details Name: Catherine Hancock MRN: [id number] DOB: [birth year] Today's Date: 05/30/2020   History of Present Illness  Patient is a 59 y.o. female with PMHx of bronchial asthma, IBS, migraine headaches-who was diagnosed with COVID-19 on 7/19-presented with worsening shortness of breath-found to have acute hypoxic respiratory failure secondary to COVID-19 pneumonia.   Clinical Impression  Pt was able to walk to the bathroom and back on 5 L O2 [place] dropping to 73% on 5L during gait.  She did not have typical DOE or SOB, but reports of lightheadedness and nausea as she desated.  I did a gross assessment of her vestibular system as she reported her visual field often undulated and it was negative.  I suspect her symptoms may be hypoxemic symptoms.   PT to follow acutely for deficits listed below.      Follow Up Recommendations No PT follow up    Equipment Recommendations  Other (comment) (possible home O2)    Recommendations for Other Services       Precautions / Restrictions Precautions Precautions: Fall;Other (comment) Precaution Comments: monitor O2 sats, she appears fairly "happily hypoxemic" without the typical DOE and SOB (she gets lightheaded and nauseated instead).       Mobility  Bed Mobility               General bed mobility comments: Pt was OOB in the recliner chair.   Transfers Overall transfer level: Needs assistance Equipment used: None Transfers: Sit to/from Stand Sit to Stand: Min guard         General transfer comment: Min guard assist to steady herself  Ambulation/Gait Ambulation/Gait assistance: Min guard Gait Distance (Feet): 25 Feet Assistive device: None Gait Pattern/deviations: Step-through pattern;Staggering left;Staggering right Gait velocity: decreased, and then too fast on the way back from the bathroom to be safe Gait velocity interpretation: >2.62 ft/sec, indicative of community ambulatory General Gait  Details: Pt with mildly staggering gait pattern, reporting dizziness/nausea with mobility.  O2 sats dropped to 735 On 5 L O2 [place] without audible DOE, belly breathing or signs of respiratory distress.  She does get very lightheaded and nauseated, reporting visual movement (her visual field is undulating at times) at rest.  Basic vestibular exam completed without signs of vertigo when tested (I did not test canals).   Stairs            Wheelchair Mobility    Modified Rankin (Stroke Patients Only)       Balance Overall balance assessment: Mild deficits observed, not formally tested                                           Pertinent Vitals/Pain Pain Assessment: Faces Faces Pain Scale: Hurts little more Pain Location: headache Pain Descriptors / Indicators: Discomfort Pain Intervention(s): Limited activity within patient's tolerance;Monitored during session;Repositioned    Home Living Family/patient expects to be discharged to:: Private residence Living Arrangements: Spouse/significant other;Children Available Help at Discharge: Family (husband (got COVID in dec and has it again now)) Type of Home: House Home Access: Stairs to enter Entrance Stairs-Rails: None Entrance Stairs-Number of Steps: 2 Home Layout: Multi-level;Able to live on main level with bedroom/bathroom Home Equipment: None Additional Comments: son has a basement apartment that he stays when home from college [facility])    Prior Function Level of Independence: Independent  Comments: does not work, but is completely independent at home, drives, likes to do crafts     Hand Dominance   Dominant Hand: Right    Extremity/Trunk Assessment   Upper Extremity Assessment Upper Extremity Assessment: Defer to OT evaluation    Lower Extremity Assessment Lower Extremity Assessment: Generalized weakness (at least 4/5 per gross recliner level MMT assessment)    Cervical /  Trunk Assessment Cervical / Trunk Assessment: Normal  Communication   Communication: No difficulties  Cognition Arousal/Alertness: Awake/alert Behavior During Therapy: Anxious (seems a bit anxious, jittery, on edge) Overall Cognitive Status: Within Functional Limits for tasks assessed                                        General Comments General comments (skin integrity, edema, etc.): Despite reporting lightheadedness BPs were stable 110s/60s    Exercises Other Exercises Other Exercises: Reviewed IS and flutter valve with max inspired volume 600 mL Other Exercises: encouraged hourly ankle pumps   Assessment/Plan    PT Assessment Patient needs continued PT services  PT Problem List Decreased strength;Decreased activity tolerance;Decreased balance;Decreased mobility;Cardiopulmonary status limiting activity       PT Treatment Interventions DME instruction;Gait training;Stair training;Therapeutic exercise;Functional mobility training;Therapeutic activities;Balance training;Patient/family education    PT Goals (Current goals can be found in the Care Plan section)  Acute Rehab PT Goals Patient Stated Goal: to go home get back to her family and two dogs PT Goal Formulation: With patient Time For Goal Achievement: 06/13/20 Potential to Achieve Goals: Good    Frequency Min 3X/week   Barriers to discharge        Co-evaluation               AM-PAC PT "6 Clicks" Mobility  Outcome Measure Help needed turning from your back to your side while in a flat bed without using bedrails?: None Help needed moving from lying on your back to sitting on the side of a flat bed without using bedrails?: None Help needed moving to and from a bed to a chair (including a wheelchair)?: A Little Help needed standing up from a chair using your arms (e.g., wheelchair or bedside chair)?: A Little Help needed to walk in hospital room?: A Little Help needed climbing 3-5 steps with a  railing? : A Little 6 Click Score: 20    End of Session Equipment Utilized During Treatment: Oxygen Activity Tolerance: Patient limited by fatigue;Other (comment) (limited by nausea and lightheadedness. ) Patient left: in chair;with call bell/phone within reach Nurse Communication: Mobility status PT Visit Diagnosis: Muscle weakness (generalized) (M62.81);Difficulty in walking, not elsewhere classified (R26.2)    Time: 2707-8675 PT Time Calculation (min) (ACUTE ONLY): 34 min   Charges:   PT Evaluation $PT Eval Moderate Complexity: 1 Mod PT Treatments $Gait Training: 8-22 mins        Verdene Lennert, PT, DPT  Acute Rehabilitation [phone] pager [phone]) [phone] office

## 2020-05-30 NOTE — Plan of Care (Signed)
  Problem: Clinical Measurements: Goal: Respiratory complications will improve Outcome: Progressing   

## 2020-05-31 LAB — FERRITIN: Ferritin: 477 ng/mL — ABNORMAL HIGH (ref 11–307)

## 2020-05-31 LAB — CBC WITH DIFFERENTIAL/PLATELET
Abs Immature Granulocytes: 0.53 10*3/uL — ABNORMAL HIGH (ref 0.00–0.07)
Basophils Absolute: 0 10*3/uL (ref 0.0–0.1)
Basophils Relative: 0 %
Eosinophils Absolute: 0 10*3/uL (ref 0.0–0.5)
Eosinophils Relative: 0 %
HCT: 33.9 % — ABNORMAL LOW (ref 36.0–46.0)
Hemoglobin: 11 g/dL — ABNORMAL LOW (ref 12.0–15.0)
Immature Granulocytes: 7 %
Lymphocytes Relative: 9 %
Lymphs Abs: 0.7 10*3/uL (ref 0.7–4.0)
MCH: 29.7 pg (ref 26.0–34.0)
MCHC: 32.4 g/dL (ref 30.0–36.0)
MCV: 91.6 fL (ref 80.0–100.0)
Monocytes Absolute: 0.4 10*3/uL (ref 0.1–1.0)
Monocytes Relative: 5 %
Neutro Abs: 5.8 10*3/uL (ref 1.7–7.7)
Neutrophils Relative %: 79 %
Platelets: 339 10*3/uL (ref 150–400)
RBC: 3.7 MIL/uL — ABNORMAL LOW (ref 3.87–5.11)
RDW: 11.5 % (ref 11.5–15.5)
WBC: 7.4 10*3/uL (ref 4.0–10.5)
nRBC: 0 % (ref 0.0–0.2)

## 2020-05-31 LAB — COMPREHENSIVE METABOLIC PANEL
ALT: 30 U/L (ref 0–44)
AST: 38 U/L (ref 15–41)
Albumin: 2.4 g/dL — ABNORMAL LOW (ref 3.5–5.0)
Alkaline Phosphatase: 77 U/L (ref 38–126)
Anion gap: 9 (ref 5–15)
BUN: 24 mg/dL — ABNORMAL HIGH (ref 6–20)
CO2: 27 mmol/L (ref 22–32)
Calcium: 8.9 mg/dL (ref 8.9–10.3)
Chloride: 106 mmol/L (ref 98–111)
Creatinine, Ser: 0.71 mg/dL (ref 0.44–1.00)
GFR calc Af Amer: >60 mL/min (ref >60)
GFR calc non Af Amer: >60 mL/min (ref >60)
Glucose, Bld: 149 mg/dL — ABNORMAL HIGH (ref 70–99)
Potassium: 4.3 mmol/L (ref 3.5–5.1)
Sodium: 142 mmol/L (ref 135–145)
Total Bilirubin: 0.6 mg/dL (ref 0.3–1.2)
Total Protein: 6.2 g/dL — ABNORMAL LOW (ref 6.5–8.1)

## 2020-05-31 LAB — C-REACTIVE PROTEIN: CRP: 2.8 mg/dL — ABNORMAL HIGH (ref <1.0)

## 2020-05-31 LAB — D-DIMER, QUANTITATIVE: D-Dimer, Quant: 0.71 ug/mL-FEU — ABNORMAL HIGH (ref 0.00–0.50)

## 2020-05-31 NOTE — Progress Notes (Signed)
PROGRESS NOTE                                                                                                                                                                                                             Patient Demographics:    Catherine Hancock, is a 59 y.o. female, DOB - [birth year], BTD:[id number]  Outpatient Primary MD for the patient is Tobe Sos, MD   Admit date - 05/27/2020   LOS - 3  Chief Complaint  Patient presents with  . Fever    COVID +       Brief Narrative: Patient is a 59 y.o. female with PMHx of bronchial asthma, IBS, migraine headaches-who was diagnosed with COVID-19 on 7/19-presented with worsening shortness of breath-found to have acute hypoxic respiratory failure secondary to COVID-19 pneumonia.  See below for further details.  Significant Events: 7/28>> admit for shortness of breath-found to have COVID-19 pneumonia and hypoxia 7/29>> worsening hypoxia-up to 5 L of oxygen 7/31>> improving-Down to 2 L of oxygen  Significant studies: 7/27>> chest x-ray: Patchy bilateral airspace disease 3/8>> echo: EF 60-65%  COVID-19 medications: Steroids: 7/28>> Remdesivir: 7/28>> Actemra: 7/29 x 1  Antibiotics: None  Microbiology data: 7/28>> blood culture: Negative  Procedures: None  Consults: None  DVT prophylaxis: enoxaparin (LOVENOX) injection 40 mg Start: 05/28/20 1000     Subjective:   Slept last night very comfortably-on CPAP. Feels much better.   Assessment  & Plan :   Acute Hypoxic Resp Failure due to Covid 19 Viral pneumonia: Improved-down to 2 L of oxygen-inflammatory markers are also downtrending. Remains on steroids/remdesivir-s/p Actemra on 7/29. Continue to slowly titrate down FiO2.  Assess for home O2 requirement prior to discharge.  Fever: afebrile  O2 requirements:  SpO2: 97 % O2 Flow Rate (L/min): 2 L/min   COVID-19 Labs: Recent Labs     05/29/20 0317 05/30/20 0508 05/31/20 0517  DDIMER 0.68* 0.48 0.71*  FERRITIN 415* 545* 477*  CRP 17.4* 7.0* 2.8*    No results found for: BNP  Recent Labs  Lab 05/28/20 0757  PROCALCITON <0.10    Lab Results  Component Value Date   SARSCOV2NAA POSITIVE (A) 05/28/2020   [place] NEGATIVE 02/15/2020     Prone/Incentive Spirometry: encouraged patient to lie prone for 3-4 hours at a time for a total of 16 hours a  day, and to encourage incentive spirometry use 3-4/hour.  Bronchial asthma: Not in exacerbation-continue inhaler regimen.  Depression: Stable-continue Celexa  Migraine headaches: Currently headache free-continue Topamax.  Hypothyroidism: Continue levothyroxine  OSA: Continue CPAP nightly  ABG: No results found for: PHART, PCO2ART, PO2ART, HCO3, TCO2, ACIDBASEDEF, O2SAT  Vent Settings: N/A  Condition - Extremely Guarded  Family Communication  : Spouse over the phone on 7/31  Code Status :  Full Code  Diet :  Diet Order            Diet regular Room service appropriate? Yes; Fluid consistency: Thin  Diet effective now                  Disposition Plan  :   Status is: Inpatient  Remains inpatient appropriate because:Inpatient level of care appropriate due to severity of illness   Dispo: The patient is from: Home              Anticipated d/c is to: Home              Anticipated d/c date is: > 3 days              Patient currently is not medically stable to d/c.  Barriers to discharge: Hypoxia requiring O2 supplementation/complete 5 days of IV Remdesivir  Antimicorbials  :    Anti-infectives (From admission, onward)   Start     Dose/Rate Route Frequency Ordered Stop   05/29/20 1000  remdesivir 100 mg in sodium chloride 0.9 % 100 mL IVPB     Discontinue    "Followed by" Linked Group Details   100 mg 200 mL/hr over 30 Minutes Intravenous Daily 05/28/20 0804 06/02/20 0959   05/28/20 0900  remdesivir 200 mg in sodium chloride 0.9% 250 mL IVPB        "Followed by" Linked Group Details   200 mg 580 mL/hr over 30 Minutes Intravenous Once 05/28/20 0804 05/28/20 1059      Inpatient Medications  Scheduled Meds: . albuterol  2 puff Inhalation Q6H  . vitamin C  500 mg Oral Daily  . aspirin EC  81 mg Oral Daily  . benzonatate  200 mg Oral TID  . citalopram  20 mg Oral Daily  . cyclobenzaprine  10 mg Oral QHS  . enoxaparin (LOVENOX) injection  40 mg Subcutaneous Q24H  . levothyroxine  75 mcg Oral Q0600  . methylPREDNISolone (SOLU-MEDROL) injection  40 mg Intravenous BID  . mometasone-formoterol  2 puff Inhalation BID  . montelukast  10 mg Oral QHS  . pantoprazole  40 mg Oral BID  . sodium chloride flush  3 mL Intravenous Q12H  . topiramate  100 mg Oral QHS  . topiramate  50 mg Oral Daily  . zinc sulfate  220 mg Oral Daily   Continuous Infusions: . remdesivir 100 mg in NS 100 mL 100 mg (05/31/20 0938)   PRN Meds:.acetaminophen, ALPRAZolam, [place] alk-PHENObarbital, chlorpheniramine-HYDROcodone, guaiFENesin-dextromethorphan, HYDROcodone-acetaminophen, nitroGLYCERIN, ondansetron **OR** ondansetron (ZOFRAN) IV, polyethylene glycol, promethazine, zolpidem   Time Spent in minutes  25   See all Orders from today for further details   Oren Binet M.D on 05/31/2020 at 3:53 PM  To page go to [email or web address] - use universal password  [facility] -  Office  [phone]    Objective:   Vitals:   05/31/20 1000 05/31/20 1048 05/31/20 1224 05/31/20 1255  BP:   (!) 132/64 (!) 137/69  Pulse: 54 56 63 62  Resp: 22 16 22  21  Temp:   98.2 F (36.8 C) 98.4 F (36.9 C)  TempSrc:   Oral Oral  SpO2: 96% 96% 96% 97%  Weight:      Height:        Wt Readings from Last 3 Encounters:  05/28/20 68.9 kg  01/14/20 68.9 kg  12/10/19 69.1 kg     Intake/Output Summary (Last 24 hours) at 05/31/2020 1553 Last data filed at 05/31/2020 1535 Gross per 24 hour  Intake 360 ml  Output --  Net 360 ml     Physical Exam Gen  Exam:Alert awake-not in any distress HEENT:atraumatic, normocephalic Chest: B/L clear to auscultation anteriorly CVS:S1S2 regular Abdomen:soft non tender, non distended Extremities:no edema Neurology: Non focal Skin: no rash   Data Review:    CBC Recent Labs  Lab 05/28/20 0536 05/29/20 0317 05/30/20 0508 05/31/20 0517  WBC 8.7 6.3 5.4 7.4  HGB 11.4* 10.6* 10.8* 11.0*  HCT 35.2* 32.8* 33.7* 33.9*  PLT 236 252 301 339  MCV 91.7 92.1 92.1 91.6  MCH 29.7 29.8 29.5 29.7  MCHC 32.4 32.3 32.0 32.4  RDW 11.7 11.7 11.6 11.5  LYMPHSABS 0.8 0.8 0.5* 0.7  MONOABS 0.3 0.4 0.3 0.4  EOSABS 0.0 0.0 0.0 0.0  BASOSABS 0.0 0.0 0.0 0.0    Chemistries  Recent Labs  Lab 05/28/20 0536 05/29/20 0317 05/30/20 0508 05/31/20 0517  NA 137 141 143 142  K 4.0 3.9 3.7 4.3  CL 103 107 108 106  CO2 24 24 24 27   GLUCOSE 112* 117* 133* 149*  BUN 21* 17 16 24*  CREATININE 0.73 0.59 0.63 0.71  CALCIUM 9.0 9.0 9.0 8.9  MG  --  2.3  --   --   AST  --  36 30 38  ALT  --  26 22 30   ALKPHOS  --  91 86 77  BILITOT  --  0.4 0.5 0.6   ------------------------------------------------------------------------------------------------------------------ No results for input(s): CHOL, HDL, LDLCALC, TRIG, CHOLHDL, LDLDIRECT in the last 72 hours.  No results found for: HGBA1C ------------------------------------------------------------------------------------------------------------------ No results for input(s): TSH, T4TOTAL, T3FREE, THYROIDAB in the last 72 hours.  Invalid input(s): FREET3 ------------------------------------------------------------------------------------------------------------------ Recent Labs    05/30/20 0508 05/31/20 0517  FERRITIN 545* 477*    Coagulation profile No results for input(s): INR, PROTIME in the last 168 hours.  Recent Labs    05/30/20 0508 05/31/20 0517  DDIMER 0.48 0.71*    Cardiac Enzymes No results for input(s): CKMB, TROPONINI, MYOGLOBIN in the  last 168 hours.  Invalid input(s): CK ------------------------------------------------------------------------------------------------------------------ No results found for: BNP  Micro Results Recent Results (from the past 240 hour(s))  SARS Coronavirus 2 by RT PCR (hospital order, performed in [facility] hospital lab) Nasopharyngeal Nasopharyngeal Swab     Status: Abnormal   Collection Time: 05/28/20  5:36 AM   Specimen: Nasopharyngeal Swab  Result Value Ref Range Status   SARS Coronavirus 2 POSITIVE (A) NEGATIVE Final    Comment: RESULT CALLED TO, READ BACK BY AND VERIFIED WITH: RN MEGAN R. BY MESSAN H. AT 7829 ON 05/28/2020 (NOTE) SARS-CoV-2 target nucleic acids are DETECTED  SARS-CoV-2 RNA is generally detectable in upper respiratory specimens  during the acute phase of infection.  Positive results are indicative  of the presence of the identified virus, but do not rule out bacterial infection or co-infection with other pathogens not detected by the test.  Clinical correlation with patient history and  other diagnostic information is necessary to determine patient infection status.  The expected  result is negative.  Fact Sheet for Patients:   StrictlyIdeas.no   Fact Sheet for Healthcare Providers:   BankingDealers.co.za    This test is not yet approved or cleared by the [place] FDA and  has been authorized for detection and/or diagnosis of SARS-CoV-2 by FDA under an Emergency Use Authorization (EUA).  This EUA will remain in effect (mean ing this test can be used) for the duration of  the COVID-19 declaration under Section 564(b)(1) of the Act, 21 U.S.C. section 360-bbb-3(b)(1), unless the authorization is terminated or revoked sooner.  Performed at [facility], [place] [address], [place], [place] [address]   Blood Culture (routine x 2)     Status: None (Preliminary result)   Collection Time: 05/28/20  7:57 AM    Specimen: BLOOD  Result Value Ref Range Status   Specimen Description BLOOD LEFT ANTECUBITAL  Final   Special Requests   Final    BOTTLES DRAWN AEROBIC AND ANAEROBIC Blood Culture adequate volume   Culture   Final    NO GROWTH 3 DAYS Performed at [facility], [place] [address] [place], [place] [address]    Report Status PENDING  Incomplete  Blood Culture (routine x 2)     Status: None (Preliminary result)   Collection Time: 05/28/20  8:05 AM   Specimen: BLOOD  Result Value Ref Range Status   Specimen Description BLOOD SITE NOT SPECIFIED  Final   Special Requests   Final    BOTTLES DRAWN AEROBIC AND ANAEROBIC Blood Culture results may not be optimal due to an excessive volume of blood received in culture bottles   Culture   Final    NO GROWTH 3 DAYS Performed at [facility], [place] [address], [place], [place] [address]    Report Status PENDING  Incomplete  MRSA PCR Screening     Status: None   Collection Time: 05/28/20 11:51 PM   Specimen: Nasal Mucosa; Nasopharyngeal  Result Value Ref Range Status   MRSA by PCR NEGATIVE NEGATIVE Final    Comment:        The GeneXpert MRSA Assay (FDA approved for NASAL specimens only), is one component of a comprehensive MRSA colonization surveillance program. It is not intended to diagnose MRSA infection nor to guide or monitor treatment for MRSA infections. Performed at [facility], [place] [address] [place],  [address]     Radiology Reports DG Chest Portable 1 View  Result Date: 05/27/2020 CLINICAL DATA:  COVID-19 positivity with cough and fevers EXAM: PORTABLE CHEST 1 VIEW COMPARISON:  10/30/2019 FINDINGS: Cardiac shadow is within normal limits. Patchy opacities are noted in both lungs. The lungs are hypoinflated. No sizable effusion is seen. No bony abnormality is noted. IMPRESSION: Patchy bilateral airspace opacities consistent with the given clinical history. Electronically Signed   By: Inez Catalina M.D.   On:  05/27/2020 21:35

## 2020-06-01 LAB — CBC WITH DIFFERENTIAL/PLATELET
Abs Immature Granulocytes: 0.75 10*3/uL — ABNORMAL HIGH (ref 0.00–0.07)
Basophils Absolute: 0 10*3/uL (ref 0.0–0.1)
Basophils Relative: 1 %
Eosinophils Absolute: 0 10*3/uL (ref 0.0–0.5)
Eosinophils Relative: 0 %
HCT: 35.3 % — ABNORMAL LOW (ref 36.0–46.0)
Hemoglobin: 11.7 g/dL — ABNORMAL LOW (ref 12.0–15.0)
Immature Granulocytes: 9 %
Lymphocytes Relative: 17 %
Lymphs Abs: 1.4 10*3/uL (ref 0.7–4.0)
MCH: 30 pg (ref 26.0–34.0)
MCHC: 33.1 g/dL (ref 30.0–36.0)
MCV: 90.5 fL (ref 80.0–100.0)
Monocytes Absolute: 0.5 10*3/uL (ref 0.1–1.0)
Monocytes Relative: 5 %
Neutro Abs: 5.8 10*3/uL (ref 1.7–7.7)
Neutrophils Relative %: 68 %
Platelets: 404 10*3/uL — ABNORMAL HIGH (ref 150–400)
RBC: 3.9 MIL/uL (ref 3.87–5.11)
RDW: 11.5 % (ref 11.5–15.5)
WBC: 8.5 10*3/uL (ref 4.0–10.5)
nRBC: 0 % (ref 0.0–0.2)

## 2020-06-01 LAB — D-DIMER, QUANTITATIVE: D-Dimer, Quant: 0.64 ug/mL-FEU — ABNORMAL HIGH (ref 0.00–0.50)

## 2020-06-01 LAB — COMPREHENSIVE METABOLIC PANEL
ALT: 29 U/L (ref 0–44)
AST: 32 U/L (ref 15–41)
Albumin: 2.5 g/dL — ABNORMAL LOW (ref 3.5–5.0)
Alkaline Phosphatase: 73 U/L (ref 38–126)
Anion gap: 10 (ref 5–15)
BUN: 24 mg/dL — ABNORMAL HIGH (ref 6–20)
CO2: 26 mmol/L (ref 22–32)
Calcium: 8.6 mg/dL — ABNORMAL LOW (ref 8.9–10.3)
Chloride: 106 mmol/L (ref 98–111)
Creatinine, Ser: 0.7 mg/dL (ref 0.44–1.00)
GFR calc Af Amer: >60 mL/min (ref >60)
GFR calc non Af Amer: >60 mL/min (ref >60)
Glucose, Bld: 89 mg/dL (ref 70–99)
Potassium: 3.6 mmol/L (ref 3.5–5.1)
Sodium: 142 mmol/L (ref 135–145)
Total Bilirubin: 0.6 mg/dL (ref 0.3–1.2)
Total Protein: 5.6 g/dL — ABNORMAL LOW (ref 6.5–8.1)

## 2020-06-01 LAB — FERRITIN: Ferritin: 369 ng/mL — ABNORMAL HIGH (ref 11–307)

## 2020-06-01 LAB — C-REACTIVE PROTEIN: CRP: 0.9 mg/dL (ref <1.0)

## 2020-06-01 NOTE — Progress Notes (Signed)
PROGRESS NOTE    Catherine Hancock  BSJ:[id number] DOB: 1961/05/03 DOA: 05/27/2020 PCP: Tobe Sos, MD    Brief Narrative:  Patient admitted to the hospital with a working diagnosis of acute hypoxic respiratory failure due to SARS COVID-19 viral pneumonia.  59 year old female with past medical history of asthma, hypothyroidism, depression, migraines and sleep apnea.  She was diagnosed with SARS COVID-19 July 20.  Her symptoms were consistent with cough, chest tightness, wheezing, abdominal pain, nausea, headaches, loss of taste and smell.  Patient was treated as an outpatient with over-the-counter medications along with steroids and azithromycin.  Her symptoms were persistent, including fever that prompted her to come to the hospital. On her initial physical examination her temperature was 100.3 F, respiratory rate 18-29, oxygen saturation 86% on room air, heart rate 77.  She had Rales bilaterally, diffuse, no wheezing, heart S1-S2, present rhythmic, soft abdomen, no lower extremity edema. Sodium 137, potassium 4.0, chloride 103, bicarb 24, glucose 112, BUN 21, creatinine 0.73 white count 8.7, hemoglobin 11.4, hematocrit 35.2, platelets 236.  SARS COVID-19 positive. Chest radiograph had bilateral interstitial infiltrates, left upper lobe, right lower lobe and right upper lobe.   Assessment & Plan:   Principal Problem:   Pneumonia due to COVID-19 virus Active Problems:   Migraine headache   Hypothyroidism   Asthma   OSA on CPAP   Depression   1.  Acute hypoxic respiratory failure due to SARS COVID-19 viral pneumonia.  RR: 21 Pulse oxymetry: 95%  Fi02: 2 L min per   COVID-19 Labs  Recent Labs    05/30/20 0508 05/31/20 0517  DDIMER 0.48 0.71*  FERRITIN 545* 477*  CRP 7.0* 2.8*    Lab Results  Component Value Date   SARSCOV2NAA POSITIVE (A) 05/28/2020   [place] NEGATIVE 02/15/2020   Patient feeling better but not yet back to baseline, this am very weak and  deconditioned, positive nausea.   Completed remdesivir today #5/5, continue with systemic steroids, bronchodilator therapy and antitussive agents. Airway clearing techniques.   If patient feeling better in am with improve nausea, plan for possible dc home. Follow with ambulatory oxymetry on room air.   2. Asthma. No clinical signs of exacerbation  3. Depression. Continue with celexa  4. Hypothyroid. Continue with levothyroxine  5. OSA. Continue with Cpap as tolerated  6. Migranes. Continue with topiramate per her home regimen.   Status is: Inpatient  Remains inpatient appropriate because:Inpatient level of care appropriate due to severity of illness   Dispo: The patient is from: Home              Anticipated d/c is to: Home              Anticipated d/c date is: 1 day              Patient currently is not medically stable to d/c.   DVT prophylaxis: Enoxaparin   Code Status:   full  Family Communication:  No family at the bedside       Subjective: Patient is having nausea this am, continue to be very weak and deconditioned.   Objective: Vitals:   05/31/20 1224 05/31/20 1255 05/31/20 2023 06/01/20 0507  BP: (!) 132/64 (!) 137/69 (!) 108/51 (!) 116/62  Pulse: 63 62 71 63  Resp: 22 21 23 21   Temp: 98.2 F (36.8 C) 98.4 F (36.9 C) 97.8 F (36.6 C) 98.1 F (36.7 C)  TempSrc: Oral Oral Oral Oral  SpO2: 96% 97% 90%  95%  Weight:      Height:        Intake/Output Summary (Last 24 hours) at 06/01/2020 0944 Last data filed at 06/01/2020 3614 Gross per 24 hour  Intake 400 ml  Output 1 ml  Net 399 ml   Filed Weights   05/28/20 0443  Weight: 68.9 kg    Examination:   General: deconditioned  Neurology: Awake and alert, non focal  E ENT: mild pallor, no icterus, oral mucosa moist Cardiovascular: No JVD. S1-S2 present, rhythmic, no gallops, rubs, or murmurs. No lower extremity edema. Pulmonary: positive breath sounds bilaterally,  Gastrointestinal. Abdomen  soft Skin. No rashes Musculoskeletal: no joint deformities     Data Reviewed: I have personally reviewed following labs and imaging studies  CBC: Recent Labs  Lab 05/28/20 0536 05/29/20 0317 05/30/20 0508 05/31/20 0517  WBC 8.7 6.3 5.4 7.4  NEUTROABS 7.3 4.8 4.2 5.8  HGB 11.4* 10.6* 10.8* 11.0*  HCT 35.2* 32.8* 33.7* 33.9*  MCV 91.7 92.1 92.1 91.6  PLT 236 252 301 431   Basic Metabolic Panel: Recent Labs  Lab 05/28/20 0536 05/29/20 0317 05/30/20 0508 05/31/20 0517  NA 137 141 143 142  K 4.0 3.9 3.7 4.3  CL 103 107 108 106  CO2 24 24 24 27   GLUCOSE 112* 117* 133* 149*  BUN 21* 17 16 24*  CREATININE 0.73 0.59 0.63 0.71  CALCIUM 9.0 9.0 9.0 8.9  MG  --  2.3  --   --   PHOS  --  3.9  --   --    GFR: Estimated Creatinine Clearance: 65.6 mL/min (by C-G formula based on SCr of 0.71 mg/dL). Liver Function Tests: Recent Labs  Lab 05/29/20 0317 05/30/20 0508 05/31/20 0517  AST 36 30 38  ALT 26 22 30   ALKPHOS 91 86 77  BILITOT 0.4 0.5 0.6  PROT 6.6 6.4* 6.2*  ALBUMIN 2.6* 2.5* 2.4*   No results for input(s): LIPASE, AMYLASE in the last 168 hours. No results for input(s): AMMONIA in the last 168 hours. Coagulation Profile: No results for input(s): INR, PROTIME in the last 168 hours. Cardiac Enzymes: No results for input(s): CKTOTAL, CKMB, CKMBINDEX, TROPONINI in the last 168 hours. BNP (last 3 results) No results for input(s): PROBNP in the last 8760 hours. HbA1C: No results for input(s): HGBA1C in the last 72 hours. CBG: No results for input(s): GLUCAP in the last 168 hours. Lipid Profile: No results for input(s): CHOL, HDL, LDLCALC, TRIG, CHOLHDL, LDLDIRECT in the last 72 hours. Thyroid Function Tests: No results for input(s): TSH, T4TOTAL, FREET4, T3FREE, THYROIDAB in the last 72 hours. Anemia Panel: Recent Labs    05/30/20 0508 05/31/20 0517  FERRITIN 545* 477*      Radiology Studies: I have reviewed all of the imaging during this hospital  visit personally     Scheduled Meds: . albuterol  2 puff Inhalation Q6H  . vitamin C  500 mg Oral Daily  . aspirin EC  81 mg Oral Daily  . benzonatate  200 mg Oral TID  . citalopram  20 mg Oral Daily  . cyclobenzaprine  10 mg Oral QHS  . enoxaparin (LOVENOX) injection  40 mg Subcutaneous Q24H  . levothyroxine  75 mcg Oral Q0600  . methylPREDNISolone (SOLU-MEDROL) injection  40 mg Intravenous BID  . mometasone-formoterol  2 puff Inhalation BID  . montelukast  10 mg Oral QHS  . pantoprazole  40 mg Oral BID  . sodium chloride flush  3 mL  Intravenous Q12H  . topiramate  100 mg Oral QHS  . topiramate  50 mg Oral Daily  . zinc sulfate  220 mg Oral Daily   Continuous Infusions: . remdesivir 100 mg in NS 100 mL 100 mg (06/01/20 0924)     LOS: 4 days        Mckynlie Vanderslice Gerome Apley, MD

## 2020-06-01 NOTE — Progress Notes (Signed)
SATURATION QUALIFICATIONS: (This note is used to comply with regulatory documentation for home oxygen)  Patient Saturations on Room Air at Rest = 80%  Patient Saturations on Room Air while Ambulating = 77%  Patient Saturations on 2 Liters of oxygen while Ambulating = 87%  Please briefly explain why patient needs home oxygen: Patient needs oxygen at rest and while ambulating.

## 2020-06-01 NOTE — Progress Notes (Signed)
After patient was moved from bed to chair her oxygen needs increased and she was placed on 3 L Catherine Hancock and after few minutes on 4 L Catherine Hancock. On 4L sat O2 is 91%. Will continue to monitor.

## 2020-06-01 NOTE — Progress Notes (Signed)
Patient was transferred back from chair to bed and Sat O2 dropped to low 70s on 4 L [place]. Sat O2 increased to 91% when patient was placed on 6 L HFNC. Patient is resting in bed; no complaints. Will continue to monitor.

## 2020-06-02 LAB — COMPREHENSIVE METABOLIC PANEL
ALT: 31 U/L (ref 0–44)
AST: 33 U/L (ref 15–41)
Albumin: 2.6 g/dL — ABNORMAL LOW (ref 3.5–5.0)
Alkaline Phosphatase: 72 U/L (ref 38–126)
Anion gap: 10 (ref 5–15)
BUN: 26 mg/dL — ABNORMAL HIGH (ref 6–20)
CO2: 26 mmol/L (ref 22–32)
Calcium: 8.4 mg/dL — ABNORMAL LOW (ref 8.9–10.3)
Chloride: 105 mmol/L (ref 98–111)
Creatinine, Ser: 0.74 mg/dL (ref 0.44–1.00)
GFR calc Af Amer: >60 mL/min (ref >60)
GFR calc non Af Amer: >60 mL/min (ref >60)
Glucose, Bld: 135 mg/dL — ABNORMAL HIGH (ref 70–99)
Potassium: 4 mmol/L (ref 3.5–5.1)
Sodium: 141 mmol/L (ref 135–145)
Total Bilirubin: 0.4 mg/dL (ref 0.3–1.2)
Total Protein: 5.7 g/dL — ABNORMAL LOW (ref 6.5–8.1)

## 2020-06-02 LAB — CBC WITH DIFFERENTIAL/PLATELET
Abs Immature Granulocytes: 0.73 10*3/uL — ABNORMAL HIGH (ref 0.00–0.07)
Basophils Absolute: 0 10*3/uL (ref 0.0–0.1)
Basophils Relative: 0 %
Eosinophils Absolute: 0 10*3/uL (ref 0.0–0.5)
Eosinophils Relative: 0 %
HCT: 35 % — ABNORMAL LOW (ref 36.0–46.0)
Hemoglobin: 11.5 g/dL — ABNORMAL LOW (ref 12.0–15.0)
Immature Granulocytes: 8 %
Lymphocytes Relative: 8 %
Lymphs Abs: 0.7 10*3/uL (ref 0.7–4.0)
MCH: 29.9 pg (ref 26.0–34.0)
MCHC: 32.9 g/dL (ref 30.0–36.0)
MCV: 91.1 fL (ref 80.0–100.0)
Monocytes Absolute: 0.2 10*3/uL (ref 0.1–1.0)
Monocytes Relative: 3 %
Neutro Abs: 7.2 10*3/uL (ref 1.7–7.7)
Neutrophils Relative %: 81 %
Platelets: 367 10*3/uL (ref 150–400)
RBC: 3.84 MIL/uL — ABNORMAL LOW (ref 3.87–5.11)
RDW: 11.4 % — ABNORMAL LOW (ref 11.5–15.5)
WBC: 8.9 10*3/uL (ref 4.0–10.5)
nRBC: 0 % (ref 0.0–0.2)

## 2020-06-02 LAB — D-DIMER, QUANTITATIVE: D-Dimer, Quant: 0.71 ug/mL-FEU — ABNORMAL HIGH (ref 0.00–0.50)

## 2020-06-02 LAB — CULTURE, BLOOD (ROUTINE X 2)
Culture: NO GROWTH
Culture: NO GROWTH
Special Requests: ADEQUATE

## 2020-06-02 LAB — C-REACTIVE PROTEIN: CRP: 0.7 mg/dL (ref <1.0)

## 2020-06-02 LAB — FERRITIN: Ferritin: 308 ng/mL — ABNORMAL HIGH (ref 11–307)

## 2020-06-02 MED ORDER — METHYLPREDNISOLONE SODIUM SUCC 40 MG IJ SOLR: 40.0000 mg | Freq: Every day | INTRAMUSCULAR | Status: DC

## 2020-06-02 NOTE — Progress Notes (Signed)
PROGRESS NOTE    Catherine Hancock  RJJ:[id number] DOB: November 03, 1960 DOA: 05/27/2020 PCP: Tobe Sos, MD    Brief Narrative:  Patient admitted to the hospital with a working diagnosis of acute hypoxic respiratory failure due to SARS COVID-19 viral pneumonia.  59 year old female with past medical history of asthma, hypothyroidism, depression, migraines and sleep apnea.  She was diagnosed with SARS COVID-19 July 20.  Her symptoms were consistent with cough, chest tightness, wheezing, abdominal pain, nausea, headaches, loss of taste and smell.  Patient was treated as an outpatient with over-the-counter medications along with steroids and azithromycin.  Her symptoms were persistent, including fever that prompted her to come to the hospital. On her initial physical examination her temperature was 100.3 F, respiratory rate 18-29, oxygen saturation 86% on room air, heart rate 77.  She had Rales bilaterally, diffuse, no wheezing, heart S1-S2, present rhythmic, soft abdomen, no lower extremity edema. Sodium 137, potassium 4.0, chloride 103, bicarb 24, glucose 112, BUN 21, creatinine 0.73 white count 8.7, hemoglobin 11.4, hematocrit 35.2, platelets 236.  SARS COVID-19 positive. Chest radiograph had bilateral interstitial infiltrates, left upper lobe, right lower lobe and right upper lobe.   Assessment & Plan:   Principal Problem:   Pneumonia due to COVID-19 virus Active Problems:   Migraine headache   Hypothyroidism   Asthma   OSA on CPAP   Depression   1.  Acute hypoxic respiratory failure due to SARS COVID-19 viral pneumonia. Sp remdesivir #5/5.   RR: 21  Pulse oxymetry: 92%  Fi02: 2L/ min per [place]  COVID-19 Labs  Recent Labs    05/31/20 0517 06/01/20 0857 06/02/20 0352  DDIMER 0.71* 0.64* 0.71*  FERRITIN 477* 369* 308*  CRP 2.8* 0.9 0.7    Lab Results  Component Value Date   SARSCOV2NAA POSITIVE (A) 05/28/2020   [place] NEGATIVE 02/15/2020    Patient continue to  have dyspnea, she has significant oxygen saturation with movement, associated with worsening dyspnea.   Will continue medical therapy with systemic steroids, bronchodilators and airway clearing techniques with incentive spirometer and flutter valve.  Continue antitussive agents.  If oxygenation and dyspnea improve in am, will plan to discharge patient with home 02 and to completer 10 days of systemic steroids.    Decrease dose of methylprednisolone to 40 mg daily for now.   2. Asthma. No active clinical signs of exacerbation. Continue with dulera. Continue with montelukast.   3. Depression/ anxiety. On celexa. Continue with as needed alprazolam.   4. Hypothyroid.On levothyroxine  5. OSA.  On Cpap as tolerated  6. Migranes. On  topiramate per her home regimen.    Status is: Inpatient  Remains inpatient appropriate because:IV treatments appropriate due to intensity of illness or inability to take PO   Dispo: The patient is from: Home              Anticipated d/c is to: Home              Anticipated d/c date is: 1 day              Patient currently is not medically stable to d/c.    DVT prophylaxis: Enoxaparin   Code Status:   full  Family Communication:  No family at the bedside     Subjective: Continue to have dyspnea, worse with exertion associated with oxygen desaturation below 88% with minimal efforts. Her nausea has improved, no chest pain.   Objective: Vitals:   06/01/20 1900 06/01/20 2115 06/02/20  0508 06/02/20 0838  BP:  (!) 117/62 (!) 115/57 (!) 103/54  Pulse: 70 63 62 67  Resp: 20 20 20 21   Temp:  98.2 F (36.8 C) 98 F (36.7 C) 97.7 F (36.5 C)  TempSrc:  Oral Oral Oral  SpO2: 91% (!) 89% 96% 92%  Weight:      Height:        Intake/Output Summary (Last 24 hours) at 06/02/2020 1000 Last data filed at 06/02/2020 0541 Gross per 24 hour  Intake 420 ml  Output --  Net 420 ml   Filed Weights   05/28/20 0443  Weight: 68.9 kg    Examination:    General: deconditioned. Positive dyspnea at rest.  Neurology: Awake and alert, non focal  E ENT: no pallor, no icterus, oral mucosa moist Cardiovascular: No JVD. S1-S2 present, rhythmic, no gallops, rubs, or murmurs. No lower extremity edema. Pulmonary: positive breath sounds bilaterally, with no wheezing, rhonchi or rales. Gastrointestinal. Abdomen soft and non tender Skin. No rashes Musculoskeletal: no joint deformities     Data Reviewed: I have personally reviewed following labs and imaging studies  CBC: Recent Labs  Lab 05/29/20 0317 05/30/20 0508 05/31/20 0517 06/01/20 0857 06/02/20 0352  WBC 6.3 5.4 7.4 8.5 8.9  NEUTROABS 4.8 4.2 5.8 5.8 7.2  HGB 10.6* 10.8* 11.0* 11.7* 11.5*  HCT 32.8* 33.7* 33.9* 35.3* 35.0*  MCV 92.1 92.1 91.6 90.5 91.1  PLT 252 301 339 404* 735   Basic Metabolic Panel: Recent Labs  Lab 05/29/20 0317 05/30/20 0508 05/31/20 0517 06/01/20 0857 06/02/20 0352  NA 141 143 142 142 141  K 3.9 3.7 4.3 3.6 4.0  CL 107 108 106 106 105  CO2 24 24 27 26 26   GLUCOSE 117* 133* 149* 89 135*  BUN 17 16 24* 24* 26*  CREATININE 0.59 0.63 0.71 0.70 0.74  CALCIUM 9.0 9.0 8.9 8.6* 8.4*  MG 2.3  --   --   --   --   PHOS 3.9  --   --   --   --    GFR: Estimated Creatinine Clearance: 65.6 mL/min (by C-G formula based on SCr of 0.74 mg/dL). Liver Function Tests: Recent Labs  Lab 05/29/20 0317 05/30/20 0508 05/31/20 0517 06/01/20 0857 06/02/20 0352  AST 36 30 38 32 33  ALT 26 22 30 29 31   ALKPHOS 91 86 77 73 72  BILITOT 0.4 0.5 0.6 0.6 0.4  PROT 6.6 6.4* 6.2* 5.6* 5.7*  ALBUMIN 2.6* 2.5* 2.4* 2.5* 2.6*   No results for input(s): LIPASE, AMYLASE in the last 168 hours. No results for input(s): AMMONIA in the last 168 hours. Coagulation Profile: No results for input(s): INR, PROTIME in the last 168 hours. Cardiac Enzymes: No results for input(s): CKTOTAL, CKMB, CKMBINDEX, TROPONINI in the last 168 hours. BNP (last 3 results) No results for  input(s): PROBNP in the last 8760 hours. HbA1C: No results for input(s): HGBA1C in the last 72 hours. CBG: No results for input(s): GLUCAP in the last 168 hours. Lipid Profile: No results for input(s): CHOL, HDL, LDLCALC, TRIG, CHOLHDL, LDLDIRECT in the last 72 hours. Thyroid Function Tests: No results for input(s): TSH, T4TOTAL, FREET4, T3FREE, THYROIDAB in the last 72 hours. Anemia Panel: Recent Labs    06/01/20 0857 06/02/20 0352  FERRITIN 369* 308*      Radiology Studies: I have reviewed all of the imaging during this hospital visit personally     Scheduled Meds: . albuterol  2 puff Inhalation Q6H  .  vitamin C  500 mg Oral Daily  . aspirin EC  81 mg Oral Daily  . benzonatate  200 mg Oral TID  . citalopram  20 mg Oral Daily  . cyclobenzaprine  10 mg Oral QHS  . enoxaparin (LOVENOX) injection  40 mg Subcutaneous Q24H  . levothyroxine  75 mcg Oral Q0600  . methylPREDNISolone (SOLU-MEDROL) injection  40 mg Intravenous BID  . mometasone-formoterol  2 puff Inhalation BID  . montelukast  10 mg Oral QHS  . pantoprazole  40 mg Oral BID  . sodium chloride flush  3 mL Intravenous Q12H  . topiramate  100 mg Oral QHS  . topiramate  50 mg Oral Daily  . zinc sulfate  220 mg Oral Daily   Continuous Infusions:   LOS: 5 days        Kasarah Sitts Gerome Apley, MD  /

## 2020-06-02 NOTE — Progress Notes (Signed)
During ambulation with PT, patient oxygen saturation dropped to 0's-80's. 02 increased from 5L/M via [place] to HFNC at 6L/M.  Sats 90% at this time.

## 2020-06-02 NOTE — Progress Notes (Signed)
Occupational Therapy Treatment Patient Details Name: Catherine Hancock MRN: [id number] DOB: [birth year] Today's Date: 06/02/2020    History of present illness Patient is a 59 y.o. female with PMHx of bronchial asthma, IBS, migraine headaches-who was diagnosed with COVID-19 on 7/19-presented with worsening shortness of breath-found to have acute hypoxic respiratory failure secondary to COVID-19 pneumonia.    OT comments  Patient continues to feel dizziness/nausea this session with standing and mobility.  BP did drop with position change (see below) and SpO2 decreased with activity.  Patient started session on 4L O2 via [place] with SpO2 at 90.  After 3 min standing grooming desat to 82.  Seated rest break and then patient proceeded with seated bath.  After walking across room to commode and toileting, patient SpO2 decreased to 75 and patient became anxious.  Practiced pursed lip breathing and SpO2 lingering at 80 so O2 was changed to HFNC and 6L.  After 8 min rest SpO2 at 87.  Patient still presenting with decreased activity tolerance and general weakness.  Will continue to benefit from acute level OT  To improve independence and safety with ADLs.  BP: 109/62 - sit  92/73 - stand 0 min 82/52 - stand 3 min 98/52 - sit   Follow Up Recommendations  Home health OT;Supervision - Intermittent    Equipment Recommendations  3 in 1 bedside commode    Recommendations for Other Services      Precautions / Restrictions Precautions Precautions: Fall;Other (comment) Precaution Comments: monitor O2 Restrictions Weight Bearing Restrictions: No       Mobility Bed Mobility Overal bed mobility: Needs Assistance Bed Mobility: Supine to Sit     Supine to sit: Supervision        Transfers Overall transfer level: Needs assistance Equipment used: None Transfers: Sit to/from Stand Sit to Stand: Min guard Stand pivot transfers: Min guard       General transfer comment: Some mild unsteadiness when  walking    Balance Overall balance assessment: Mild deficits observed, not formally tested                                         ADL either performed or assessed with clinical judgement   ADL Overall ADL's : Needs assistance/impaired     Grooming: Wash/dry hands;Wash/dry face;Oral care;Supervision/safety;Set up;Standing   Upper Body Bathing: Set up;Supervision/ safety;Sitting   Lower Body Bathing: Supervison/ safety;Set up;Sit to/from stand   Upper Body Dressing : Supervision/safety;Set up;Sitting       Toilet Transfer: Min guard;Ambulation;Regular Toilet   Toileting- Water quality scientist and Hygiene: Sitting/lateral lean;Supervision/safety       Functional mobility during ADLs: Min guard General ADL Comments: Has some dizziness with sitting/standing causing nausea     Vision       Perception     Praxis      Cognition Arousal/Alertness: Awake/alert Behavior During Therapy: WFL for tasks assessed/performed;Anxious Overall Cognitive Status: Within Functional Limits for tasks assessed                                 General Comments: Gets a little anxious when she starts feeling nauseous         Exercises     Shoulder Instructions       General Comments Patient still feeling dizzy/nauseous with standing and mobility.  BP in  clinical impression    Pertinent Vitals/ Pain       Pain Assessment: Faces Faces Pain Scale: Hurts little more Pain Location: headache/discomfort from nausea Pain Descriptors / Indicators: Discomfort Pain Intervention(s): Limited activity within patient's tolerance;Monitored during session;Repositioned  Home Living                                          Prior Functioning/Environment              Frequency  Min 2X/week        Progress Toward Goals  OT Goals(current goals can now be found in the care plan section)  Progress towards OT goals: Progressing toward  goals  Acute Rehab OT Goals Patient Stated Goal: to go home get back to her family and two dogs OT Goal Formulation: With patient Time For Goal Achievement: 06/12/20 Potential to Achieve Goals: Good  Plan Discharge plan remains appropriate    Co-evaluation                 AM-PAC OT "6 Clicks" Daily Activity     Outcome Measure   Help from another person eating meals?: None Help from another person taking care of personal grooming?: A Little Help from another person toileting, which includes using toliet, bedpan, or urinal?: A Little Help from another person bathing (including washing, rinsing, drying)?: A Little Help from another person to put on and taking off regular upper body clothing?: A Little Help from another person to put on and taking off regular lower body clothing?: A Little 6 Click Score: 19    End of Session Equipment Utilized During Treatment: Oxygen  OT Visit Diagnosis: Muscle weakness (generalized) (M62.81);Other (comment) (decreased cardiopulminary status)   Activity Tolerance Patient tolerated treatment well;Treatment limited secondary to medical complications (Comment)   Patient Left in chair;with call bell/phone within reach   Nurse Communication Mobility status        Time: 3007-6226 OT Time Calculation (min): 41 min  Charges: OT General Charges $OT Visit: 1 Visit OT Treatments $Self Care/Home Management : 38-52 mins  August Luz, OTR/L    [name] 06/02/2020, 1:20 PM

## 2020-06-02 NOTE — Progress Notes (Signed)
Physical Therapy Treatment Patient Details Name: Catherine Hancock MRN: [id number] DOB: 25-Nov-1960 Today's Date: 06/02/2020    History of Present Illness Patient is a 59 y.o. female with PMHx of bronchial asthma, IBS, migraine headaches-who was diagnosed with COVID-19 on 7/19-presented with worsening shortness of breath-found to have acute hypoxic respiratory failure secondary to COVID-19 pneumonia.     PT Comments    Pt continues to desaturate with short distance gait around the room on 6L O2 [place] (sats dropping to the high 70s).  She takes ~ 5 mins to rebound to the 90s with seated rest.  She has soft BPs 90s/50s at the end of our session and reports dizziness and nausea after getting up and moving around (none at rest).  I did do a gross vestibular screen with our earlier session which was negative for acute vestibular pathology.  OT (see OT note from today) did take orthostatic vitals.  Pt would benefit from HEP program next session and hopefully attempt a second round of walking after seated rest.  PT will continue to follow acutely for safe mobility progression   Follow Up Recommendations  Home health PT     Equipment Recommendations  Other (comment) (home O2)    Recommendations for Other Services       Precautions / Restrictions Precautions Precautions: Other (comment) Precaution Comments: monitor O2, BPs soft/orthostatic    Mobility  Bed Mobility                  Transfers Overall transfer level: Needs assistance Equipment used: None Transfers: Sit to/from Stand Sit to Stand: Min guard         General transfer comment: Min guard assist for safety and balance, line management.   Ambulation/Gait Ambulation/Gait assistance: Min guard Gait Distance (Feet): 20 Feet (x2) Assistive device: None Gait Pattern/deviations: Step-through pattern;Staggering left;Staggering right Gait velocity: decreased Gait velocity interpretation: 1.31 - 2.62 ft/sec, indicative of limited  community ambulator General Gait Details: Pt with mildly staggering gait pattern, O2 sats drop into the 70s during gait on 6L O2 [place].  Rebound to the 90s after ~ 5 mins of seated rest break.  Pt gets nauseated and dizzy with gait, BP 90s/50s seated at the end of the session (see OT note for orthostatic vitals earlier today).     Stairs             Wheelchair Mobility    Modified Rankin (Stroke Patients Only)       Balance Overall balance assessment: Mild deficits observed, not formally tested                                          Cognition Arousal/Alertness: Awake/alert Behavior During Therapy: WFL for tasks assessed/performed Overall Cognitive Status: Impaired/Different from baseline                                 General Comments: Pt seems a bit "foggy" to me today.  She was able to answer orientation questions correctly, but would say some things during the session that were a bit "off" for instance: when I asked her how high she was getting on the incentive spirometer she said, "all the way up" but was only able to do ~500 mL (which is close to what she did with me last session, so I  don't think she is going all the way up at any time).  I also was directing her to sit down on the chair and she sat on the bed and said, "oh, did you want me in the chair?" So, not significant cognitive deficits, just a bit off.  Some of it may be that she is distracted by how bad she feels when she does move around (dizzy and nauseated).       Exercises General Exercises - Lower Extremity Ankle Circles/Pumps: AROM;Both;20 reps Other Exercises Other Exercises: Reviewed IS and flutter valve with max inspired volume 500 mL    General Comments        Pertinent Vitals/Pain Pain Assessment: Faces Faces Pain Scale: Hurts little more Pain Location: headache/discomfort from nausea Pain Descriptors / Indicators: Discomfort Pain Intervention(s): Limited  activity within patient's tolerance;Monitored during session;Repositioned    Home Living                      Prior Function            PT Goals (current goals can now be found in the care plan section) Progress towards PT goals: Progressing toward goals    Frequency    Min 3X/week      PT Plan Current plan remains appropriate    Co-evaluation              AM-PAC PT "6 Clicks" Mobility   Outcome Measure  Help needed turning from your back to your side while in a flat bed without using bedrails?: None Help needed moving from lying on your back to sitting on the side of a flat bed without using bedrails?: None Help needed moving to and from a bed to a chair (including a wheelchair)?: A Little Help needed standing up from a chair using your arms (e.g., wheelchair or bedside chair)?: A Little Help needed to walk in hospital room?: A Little Help needed climbing 3-5 steps with a railing? : A Little 6 Click Score: 20    End of Session Equipment Utilized During Treatment: Oxygen Activity Tolerance: Patient limited by fatigue (by dizziness and nausea) Patient left: in chair;with call bell/phone within reach Nurse Communication: Mobility status PT Visit Diagnosis: Muscle weakness (generalized) (M62.81);Difficulty in walking, not elsewhere classified (R26.2);Pain Pain - Right/Left:  (head) Pain - part of body:  (head)     Time: 4970-2637 PT Time Calculation (min) (ACUTE ONLY): 22 min  Charges:  $Gait Training: 8-22 mins                     Verdene Lennert, PT, DPT  Acute Rehabilitation [phone] pager [phone]) [phone] office

## 2020-06-02 NOTE — Progress Notes (Signed)
Patient is tolerating 2l nasal canula when resting in bed.

## 2020-06-03 ENCOUNTER — Encounter (HOSPITAL_COMMUNITY): Payer: BC Managed Care – PPO

## 2020-06-03 LAB — D-DIMER, QUANTITATIVE: D-Dimer, Quant: 1.17 ug/mL-FEU — ABNORMAL HIGH (ref 0.00–0.50)

## 2020-06-03 LAB — BASIC METABOLIC PANEL
Anion gap: 8 (ref 5–15)
BUN: 21 mg/dL — ABNORMAL HIGH (ref 6–20)
CO2: 27 mmol/L (ref 22–32)
Calcium: 8.5 mg/dL — ABNORMAL LOW (ref 8.9–10.3)
Chloride: 104 mmol/L (ref 98–111)
Creatinine, Ser: 0.7 mg/dL (ref 0.44–1.00)
GFR calc Af Amer: >60 mL/min (ref >60)
GFR calc non Af Amer: >60 mL/min (ref >60)
Glucose, Bld: 102 mg/dL — ABNORMAL HIGH (ref 70–99)
Potassium: 3.1 mmol/L — ABNORMAL LOW (ref 3.5–5.1)
Sodium: 139 mmol/L (ref 135–145)

## 2020-06-03 LAB — FERRITIN: Ferritin: 228 ng/mL (ref 11–307)

## 2020-06-03 MED ORDER — POTASSIUM CHLORIDE CRYS ER 20 MEQ PO TBCR
40.0000 meq | EXTENDED_RELEASE_TABLET | ORAL | Status: AC
  Administered 2020-06-03 (×2): 40 meq via ORAL
  Filled 2020-06-03: qty 2

## 2020-06-03 MED ORDER — HYDROCOD POLST-CPM POLST ER 10-8 MG/5ML PO SUER
5.0000 mL | Freq: Two times a day (BID) | ORAL | Status: DC
  Administered 2020-06-03 – 2020-06-05 (×4): 5 mL via ORAL
  Filled 2020-06-03 (×4): qty 5

## 2020-06-03 MED ORDER — PREDNISONE 20 MG PO TABS
40.0000 mg | ORAL_TABLET | Freq: Every day | ORAL | Status: DC
  Administered 2020-06-03 – 2020-06-05 (×3): 40 mg via ORAL
  Filled 2020-06-03 (×3): qty 2

## 2020-06-03 NOTE — Progress Notes (Addendum)
PROGRESS NOTE    Catherine Hancock  QZE:[id number] DOB: 08/26/1961 DOA: 05/27/2020 PCP: Tobe Sos, MD    Brief Narrative:  Patient admitted to the hospital with a working diagnosis of acute hypoxic respiratory failure due to SARS COVID-19 viral pneumonia.  59 year old female with past medical history of asthma, hypothyroidism, depression, migraines and sleep apnea. She was diagnosed with SARS COVID-19 July 20. Her symptoms were consistent with cough, chest tightness, wheezing, abdominal pain, nausea, headaches, loss of taste and smell. Patient was treated as an outpatient with over-the-counter medications along with steroids and azithromycin. Her symptoms were persistent, including fever that prompted her to come to the hospital. On her initial physical examination her temperature was 100.3 F, respiratory rate18-29, oxygen saturation 86% on room air, heart rate 77. She had rales bilaterally, diffuse, no wheezing, heart S1-S2, present rhythmic, soft abdomen, no lower extremity edema. Sodium 137, potassium 4.0, chloride 103, bicarb 24, glucose 112, BUN 21, creatinine 0.73 white count 8.7, hemoglobin 11.4, hematocrit 35.2, platelets 236. SARS COVID-19 positive. Chest radiograph had bilateral interstitial infiltrates, left upper lobe, right lower lobe and right upper lobe.  Patient was admitted to the telemetry ward, she has been treated with systemic intravenous steroids along with intravenous remdesivir.  Her symptoms slowly improved along with her inflammatory markers, except for rising d dimer.  She continues to have increased oxygen requirements to 6 L/ min    Assessment & Plan:   Principal Problem:   Pneumonia due to COVID-19 virus Active Problems:   Migraine headache   Hypothyroidism   Asthma   OSA on CPAP   Depression   1.Acute hypoxic respiratory failure due to SARS COVID-19 viral pneumonia. Sp remdesivir #5/5.   RR: 20  Pulse oxymetry: 97 Fi02: 5 L/ min per  [place]  COVID-19 Labs  Recent Labs    06/01/20 0857 06/02/20 0352 06/03/20 0521  DDIMER 0.64* 0.71* 1.17*  FERRITIN 369* 308* 228  CRP 0.9 0.7  --     Lab Results  Component Value Date   SARSCOV2NAA POSITIVE (A) 05/28/2020   [place] NEGATIVE 02/15/2020    Her oxygen requirements have increased and her d dimer is rising. Continue to have dyspnea.    Continue systemic steroids with prednisone 40 mg daily, bronchodilators and airway clearing techniques with incentive spirometer and flutter valve.  On antitussive agents.  Will check [place] lower extremities and will follow d dimer in am, if continue to increase and she continue to have worsening hypoxemia, will get CT chest.  Note her blood pressure systolic has been 94 to 99 mmHg.   2, Asthma. No clinical signs of exacerbation, continue with bronchodilator therapy and  3. Depression/ anxiety. Continue with celexa and  as needed alprazolam.   4. Hypothyroid. Continue with levothyroxine  5. OSA.  follow as outpatient   6. Migranes. Continue with  topiramate per her home regimen.  7. New hypokalemia. Renal function with serum cr at 0,70 with K down to 3,1 and serum bicarbonate at 27.   Add 80 meq Kcl today in 2 divided doses.  Will follow on renal panel in am, avoid hypotension and nephrotoxic medications.   Status is: Inpatient  Remains inpatient appropriate because:Inpatient level of care appropriate due to severity of illness   Dispo: The patient is from: Home              Anticipated d/c is to: Home              Anticipated  d/c date is: 1 day              Patient currently is not medically stable to d/c.   DVT prophylaxis: Enoxaparin   Code Status:   full  Family Communication:  I spoke over the phone with the patient's husband about patient's  condition, plan of care, prognosis and all questions were addressed.     Subjective: Patient continue to have dyspnea and increasing oxygen requirements, continue  to have nausea, no vomiting. No chest pain.   Objective: Vitals:   06/02/20 2016 06/03/20 0432 06/03/20 0457 06/03/20 0600  BP: (!) 107/50 (!) 94/50 99/61 105/60  Pulse: 67 71 63 64  Resp: 17 (!) 22 18 20   Temp: 99.1 F (37.3 C) 97.8 F (36.6 C)  97.8 F (36.6 C)  TempSrc: Oral Oral  Oral  SpO2: 95% 93% 96% 97%  Weight:      Height:        Intake/Output Summary (Last 24 hours) at 06/03/2020 1321 Last data filed at 06/03/2020 0900 Gross per 24 hour  Intake 480 ml  Output --  Net 480 ml   Filed Weights   05/28/20 0443  Weight: 68.9 kg    Examination:   General: Not in pain or dyspnea, deconditioned  Neurology: Awake and alert, non focal  E ENT: mild pallor, no icterus, oral mucosa moist Cardiovascular: No JVD. S1-S2 present, rhythmic, no gallops, rubs, or murmurs. No lower extremity edema. Pulmonary: positive breath sounds bilaterally, Gastrointestinal. Abdomen soft and non tender Skin. No rashes Musculoskeletal: no joint deformities     Data Reviewed: I have personally reviewed following labs and imaging studies  CBC: Recent Labs  Lab 05/29/20 0317 05/30/20 0508 05/31/20 0517 06/01/20 0857 06/02/20 0352  WBC 6.3 5.4 7.4 8.5 8.9  NEUTROABS 4.8 4.2 5.8 5.8 7.2  HGB 10.6* 10.8* 11.0* 11.7* 11.5*  HCT 32.8* 33.7* 33.9* 35.3* 35.0*  MCV 92.1 92.1 91.6 90.5 91.1  PLT 252 301 339 404* 001   Basic Metabolic Panel: Recent Labs  Lab 05/29/20 0317 05/29/20 0317 05/30/20 0508 05/31/20 0517 06/01/20 0857 06/02/20 0352 06/03/20 0521  NA 141   < > 143 142 142 141 139  K 3.9   < > 3.7 4.3 3.6 4.0 3.1*  CL 107   < > 108 106 106 105 104  CO2 24   < > 24 27 26 26 27   GLUCOSE 117*   < > 133* 149* 89 135* 102*  BUN 17   < > 16 24* 24* 26* 21*  CREATININE 0.59   < > 0.63 0.71 0.70 0.74 0.70  CALCIUM 9.0   < > 9.0 8.9 8.6* 8.4* 8.5*  MG 2.3  --   --   --   --   --   --   PHOS 3.9  --   --   --   --   --   --    < > = values in this interval not displayed.    GFR: Estimated Creatinine Clearance: 65.6 mL/min (by C-G formula based on SCr of 0.7 mg/dL). Liver Function Tests: Recent Labs  Lab 05/29/20 0317 05/30/20 0508 05/31/20 0517 06/01/20 0857 06/02/20 0352  AST 36 30 38 32 33  ALT 26 22 30 29 31   ALKPHOS 91 86 77 73 72  BILITOT 0.4 0.5 0.6 0.6 0.4  PROT 6.6 6.4* 6.2* 5.6* 5.7*  ALBUMIN 2.6* 2.5* 2.4* 2.5* 2.6*   No results for input(s): LIPASE, AMYLASE in  the last 168 hours. No results for input(s): AMMONIA in the last 168 hours. Coagulation Profile: No results for input(s): INR, PROTIME in the last 168 hours. Cardiac Enzymes: No results for input(s): CKTOTAL, CKMB, CKMBINDEX, TROPONINI in the last 168 hours. BNP (last 3 results) No results for input(s): PROBNP in the last 8760 hours. HbA1C: No results for input(s): HGBA1C in the last 72 hours. CBG: No results for input(s): GLUCAP in the last 168 hours. Lipid Profile: No results for input(s): CHOL, HDL, LDLCALC, TRIG, CHOLHDL, LDLDIRECT in the last 72 hours. Thyroid Function Tests: No results for input(s): TSH, T4TOTAL, FREET4, T3FREE, THYROIDAB in the last 72 hours. Anemia Panel: Recent Labs    06/02/20 0352 06/03/20 0521  FERRITIN 308* 228      Radiology Studies: I have reviewed all of the imaging during this hospital visit personally     Scheduled Meds: . albuterol  2 puff Inhalation Q6H  . vitamin C  500 mg Oral Daily  . aspirin EC  81 mg Oral Daily  . benzonatate  200 mg Oral TID  . citalopram  20 mg Oral Daily  . cyclobenzaprine  10 mg Oral QHS  . enoxaparin (LOVENOX) injection  40 mg Subcutaneous Q24H  . levothyroxine  75 mcg Oral Q0600  . mometasone-formoterol  2 puff Inhalation BID  . montelukast  10 mg Oral QHS  . pantoprazole  40 mg Oral BID  . predniSONE  40 mg Oral Q breakfast  . sodium chloride flush  3 mL Intravenous Q12H  . topiramate  100 mg Oral QHS  . topiramate  50 mg Oral Daily  . zinc sulfate  220 mg Oral Daily   Continuous  Infusions:   LOS: 6 days        Kiylah Loyer Gerome Apley, MD

## 2020-06-04 ENCOUNTER — Inpatient Hospital Stay (HOSPITAL_COMMUNITY): Payer: BC Managed Care – PPO

## 2020-06-04 DIAGNOSIS — R609 Edema, unspecified: Secondary | ICD-10-CM

## 2020-06-04 LAB — BASIC METABOLIC PANEL
Anion gap: 9 (ref 5–15)
BUN: 19 mg/dL (ref 6–20)
CO2: 27 mmol/L (ref 22–32)
Calcium: 8.5 mg/dL — ABNORMAL LOW (ref 8.9–10.3)
Chloride: 105 mmol/L (ref 98–111)
Creatinine, Ser: 0.79 mg/dL (ref 0.44–1.00)
GFR calc Af Amer: >60 mL/min (ref >60)
GFR calc non Af Amer: >60 mL/min (ref >60)
Glucose, Bld: 100 mg/dL — ABNORMAL HIGH (ref 70–99)
Potassium: 3.5 mmol/L (ref 3.5–5.1)
Sodium: 141 mmol/L (ref 135–145)

## 2020-06-04 LAB — HIGH SENSITIVITY CRP: CRP, High Sensitivity: 4.11 mg/L — ABNORMAL HIGH (ref 0.00–3.00)

## 2020-06-04 LAB — D-DIMER, QUANTITATIVE: D-Dimer, Quant: 1.76 ug/mL-FEU — ABNORMAL HIGH (ref 0.00–0.50)

## 2020-06-04 MED ORDER — FUROSEMIDE 10 MG/ML IJ SOLN
40.0000 mg | Freq: Once | INTRAMUSCULAR | Status: AC
  Administered 2020-06-04: 40 mg via INTRAVENOUS
  Filled 2020-06-04: qty 4

## 2020-06-04 MED ORDER — POTASSIUM CHLORIDE CRYS ER 20 MEQ PO TBCR
40.0000 meq | EXTENDED_RELEASE_TABLET | Freq: Once | ORAL | Status: AC
  Administered 2020-06-04: 40 meq via ORAL
  Filled 2020-06-04: qty 2

## 2020-06-04 NOTE — Progress Notes (Signed)
PROGRESS NOTE    Catherine Hancock  KPV:[id number] DOB: 1961/04/16 DOA: 05/27/2020 PCP: Tobe Sos, MD    Brief Narrative:  Patient admitted to the hospital with a working diagnosis of acute hypoxic respiratory failure due to SARS COVID-19 viral pneumonia.  59 year old female with past medical history of asthma, hypothyroidism, depression, migraines and sleep apnea. She was diagnosed with SARS COVID-19 July 20. Her symptoms were consistent with cough, chest tightness, wheezing, abdominal pain, nausea, headaches, loss of taste and smell. Patient was treated as an outpatient with over-the-counter medications along with steroids and azithromycin. Her symptoms were persistent, including fever that prompted her to come to the hospital. On her initial physical examination her temperature was 100.3 F, respiratory rate18-29, oxygen saturation 86% on room air, heart rate 77. She had rales bilaterally, diffuse, no wheezing, heart S1-S2, present rhythmic, soft abdomen, no lower extremity edema. Sodium 137, potassium 4.0, chloride 103, bicarb 24, glucose 112, BUN 21, creatinine 0.73 white count 8.7, hemoglobin 11.4, hematocrit 35.2, platelets 236. SARS COVID-19 positive. Chest radiograph had bilateral interstitial infiltrates, left upper lobe, right lower lobe and right upper lobe.  Patient was admitted to the telemetry ward, she has been treated with systemic intravenous steroids along with intravenous remdesivir.   Her symptoms slowly improved along with her inflammatory markers, except for rising d dimer.  She continues to have increased oxygen requirements to 6 L/ min   Follow up chest film today with worsening bilateral interstitial infiltrates.   Assessment & Plan:   Principal Problem:   Pneumonia due to COVID-19 virus Active Problems:   Migraine headache   Hypothyroidism   Asthma   OSA on CPAP   Depression   1.Acute hypoxic respiratory failure due to SARS COVID-19 viral  pneumonia.Sp remdesivir #5/5.  RR: 20  Pulse oxymetry: 95%  Fi02: 6 L min [place]  COVID-19 Labs  Recent Labs    06/02/20 0352 06/03/20 0521 06/04/20 0522  DDIMER 0.71* 1.17* 1.76*  FERRITIN 308* 228  --   CRP 0.7  --   --     Lab Results  Component Value Date   SARSCOV2NAA POSITIVE (A) 05/28/2020   [place] NEGATIVE 02/15/2020    Film today with worsening bilateral interstitial infiltrates, compared to film on 07/27. Continue to rise d dimer.  Will add 40 mg IV furosemide for non cardiogenic pulmonary edema and will follow up on urine output.  Continue with  prednisone 40 mg daily, bronchodilator therapy and airway clearing techniques with incentive spirometer and flutter valve.  Continue with antitussive agents. Follow up on lower extremities doppler US and follow d dimer in am.    2, Asthma. No acute exacerbation, on bronchodilator therapy. Dulera and montelukast.   3. Depression/ anxiety.On celexa and  as needed alprazolam.  4. Hypothyroid. On levothyroxine  5. OSA. Stable.   6. Migranes.Ontopiramate, no acute flare.   7. New hypokalemia. Serum K today is 3,5 with serum bicarbonate at 27 and serum cr at 0,79,  Will add 40 meq kcl today and follow renal panel in am.   Patient continue to be at high risk for worsening hypoxemic respiratory failure.   Status is: Inpatient  Remains inpatient appropriate because:Inpatient level of care appropriate due to severity of illness   Dispo: The patient is from: Home              Anticipated d/c is to: Home              Anticipated d/c date  is: 2 days              Patient currently is not medically stable to d/c.   DVT prophylaxis: Enoxaparin   Code Status:   full  Family Communication:  I spoke over the phone with the patient's husband about patient's  condition, plan of care, prognosis and all questions were addressed.      Subjective: Patient continue to have dyspnea, worse with movement,  continue to need more than 5 L per [place]. Continue to have nausea.   Objective: Vitals:   06/03/20 2030 06/03/20 2036 06/03/20 2137 06/04/20 0400  BP: 110/60 112/61 (!) 115/57 (!) 105/57  Pulse: 83 80 80 60  Resp: 20 20  20   Temp: 97.8 F (36.6 C) 97.8 F (36.6 C)  98.4 F (36.9 C)  TempSrc: Oral Oral  Oral  SpO2: 95% 94% 95% 95%  Weight:      Height:        Intake/Output Summary (Last 24 hours) at 06/04/2020 0920 Last data filed at 06/04/2020 0432 Gross per 24 hour  Intake 480 ml  Output --  Net 480 ml   Filed Weights   05/28/20 0443  Weight: 68.9 kg    Examination:   General: deconditioned  Neurology: Awake and alert, non focal  E ENT: no pallor, no icterus, oral mucosa moist Cardiovascular: No JVD. S1-S2 present, rhythmic, no gallops, rubs, or murmurs. Trace lower extremity edema. Pulmonary: positive breath sounds bilaterally, Gastrointestinal. Abdomen soft and non tender Skin. No rashes Musculoskeletal: no joint deformities     Data Reviewed: I have personally reviewed following labs and imaging studies  CBC: Recent Labs  Lab 05/29/20 0317 05/30/20 0508 05/31/20 0517 06/01/20 0857 06/02/20 0352  WBC 6.3 5.4 7.4 8.5 8.9  NEUTROABS 4.8 4.2 5.8 5.8 7.2  HGB 10.6* 10.8* 11.0* 11.7* 11.5*  HCT 32.8* 33.7* 33.9* 35.3* 35.0*  MCV 92.1 92.1 91.6 90.5 91.1  PLT 252 301 339 404* 939   Basic Metabolic Panel: Recent Labs  Lab 05/29/20 0317 05/30/20 0508 05/31/20 0517 06/01/20 0857 06/02/20 0352 06/03/20 0521 06/04/20 0522  NA 141   < > 142 142 141 139 141  K 3.9   < > 4.3 3.6 4.0 3.1* 3.5  CL 107   < > 106 106 105 104 105  CO2 24   < > 27 26 26 27 27   GLUCOSE 117*   < > 149* 89 135* 102* 100*  BUN 17   < > 24* 24* 26* 21* 19  CREATININE 0.59   < > 0.71 0.70 0.74 0.70 0.79  CALCIUM 9.0   < > 8.9 8.6* 8.4* 8.5* 8.5*  MG 2.3  --   --   --   --   --   --   PHOS 3.9  --   --   --   --   --   --    < > = values in this interval not displayed.    GFR: Estimated Creatinine Clearance: 65.6 mL/min (by C-G formula based on SCr of 0.79 mg/dL). Liver Function Tests: Recent Labs  Lab 05/29/20 0317 05/30/20 0508 05/31/20 0517 06/01/20 0857 06/02/20 0352  AST 36 30 38 32 33  ALT 26 22 30 29 31   ALKPHOS 91 86 77 73 72  BILITOT 0.4 0.5 0.6 0.6 0.4  PROT 6.6 6.4* 6.2* 5.6* 5.7*  ALBUMIN 2.6* 2.5* 2.4* 2.5* 2.6*   No results for input(s): LIPASE, AMYLASE in the last 168 hours.  No results for input(s): AMMONIA in the last 168 hours. Coagulation Profile: No results for input(s): INR, PROTIME in the last 168 hours. Cardiac Enzymes: No results for input(s): CKTOTAL, CKMB, CKMBINDEX, TROPONINI in the last 168 hours. BNP (last 3 results) No results for input(s): PROBNP in the last 8760 hours. HbA1C: No results for input(s): HGBA1C in the last 72 hours. CBG: No results for input(s): GLUCAP in the last 168 hours. Lipid Profile: No results for input(s): CHOL, HDL, LDLCALC, TRIG, CHOLHDL, LDLDIRECT in the last 72 hours. Thyroid Function Tests: No results for input(s): TSH, T4TOTAL, FREET4, T3FREE, THYROIDAB in the last 72 hours. Anemia Panel: Recent Labs    06/02/20 0352 06/03/20 0521  FERRITIN 308* 228      Radiology Studies: I have reviewed all of the imaging during this hospital visit personally     Scheduled Meds: . albuterol  2 puff Inhalation Q6H  . vitamin C  500 mg Oral Daily  . aspirin EC  81 mg Oral Daily  . chlorpheniramine-HYDROcodone  5 mL Oral Q12H  . citalopram  20 mg Oral Daily  . cyclobenzaprine  10 mg Oral QHS  . enoxaparin (LOVENOX) injection  40 mg Subcutaneous Q24H  . levothyroxine  75 mcg Oral Q0600  . mometasone-formoterol  2 puff Inhalation BID  . montelukast  10 mg Oral QHS  . pantoprazole  40 mg Oral BID  . predniSONE  40 mg Oral Q breakfast  . sodium chloride flush  3 mL Intravenous Q12H  . topiramate  100 mg Oral QHS  . topiramate  50 mg Oral Daily  . zinc sulfate  220 mg Oral Daily    Continuous Infusions:   LOS: 7 days        Catherine Barresi Gerome Apley, MD

## 2020-06-04 NOTE — Progress Notes (Signed)
Lower extremity venous bilateral study completed.   Results relayed to [name], Therapist, sports.   See Cv Proc for preliminary results.   Catherine Hancock

## 2020-06-04 NOTE — TOC Initial Note (Addendum)
Transition of Care [facility]) - Initial/Assessment Note    Patient Details  Name: Catherine Hancock MRN: [id number] Date of Birth: 1961-02-12  Transition of Care [facility]) CM/SW Contact:    Carles Collet, RN Phone Number: 06/04/2020, 2:10 PM  Clinical Narrative:         Spoke w patient, she would like [place] services.  [facility]- declined [facility]- declined [facility]- declined [facility]- declined [facility]- declined [facility]- declined [facility]- declined [facility]- declined    At this time, there are no HH agencies that will accept for [facility].    Patient states that she uses [facility] for her CPAP, amd if she needs home oxygen would like to use them. [facility] is not open on the weekends, so if DC is planned for weekend, please order home oxygen Friday.     Expected Discharge Plan: [place] Barriers to Discharge: Continued Medical Work up   Patient Goals and CMS Choice Patient states their goals for this hospitalization and ongoing recovery are:: to go home CMS Medicare.gov Compare Post Acute Care list provided to:: Patient Choice offered to / list presented to : Patient  Expected Discharge Plan and Services Expected Discharge Plan: [place]     Post Acute Care Choice: Home Health, Durable Medical Equipment                                        Prior Living Arrangements/Services   Lives with:: Spouse                   Activities of Daily Living Home Assistive Devices/Equipment: CPAP ADL Screening (condition at time of admission) Patient's cognitive ability adequate to safely complete daily activities?: Yes Is the patient deaf or have difficulty hearing?: No Does the patient have difficulty seeing, even when wearing glasses/contacts?: No Does the patient have difficulty concentrating, remembering, or making decisions?: Yes Patient able to express need for assistance with ADLs?: Yes Does the patient have difficulty dressing or  bathing?: No Independently performs ADLs?: Yes (appropriate for developmental age) Does the patient have difficulty walking or climbing stairs?: No Weakness of Legs: None Weakness of Arms/Hands: None  Permission Sought/Granted                  Emotional Assessment              Admission diagnosis:  Hypoxia [R09.02] COVID-19 virus infection [U07.1] Pneumonia due to COVID-19 virus [U07.1, J12.82] Patient Active Problem List   Diagnosis Date Noted  . Pneumonia due to COVID-19 virus 05/28/2020  . Depression 05/28/2020  . GERD with stricture 09/26/2019  . OSA on CPAP 11/13/2017  . Pulmonary nodule, right   . Migraine headache   . Internal hemorrhoids   . IBS (irritable bowel syndrome)   . Hypothyroidism   . CTS (carpal tunnel syndrome)   . Chronic back pain   . Asthma   . Abdominal pain, left upper quadrant 06/09/2012  . Special screening for malignant neoplasms, colon 06/09/2012  . CARDIAC MURMUR 03/18/2010  . PULMONARY NODULE, RIGHT LOWER LOBE 03/20/2008  . SOB 08/17/2007  . COUGH 08/17/2007  . Chest pain of uncertain etiology 35/00/9381   PCP:  Tobe Sos, MD Pharmacy:   [place], [place] [address] [place] [address] Phone: [phone] Fax: [phone]  [facility] [place], [place] - [address] AT [address] [address] Phone: [phone] Fax: [phone]     Social Determinants of Health (SDOH) Interventions    Readmission Risk Interventions No flowsheet data found.

## 2020-06-04 NOTE — Care Management (Signed)
Benefit check sent for Eliquis and Xaralto.  Patient has Pharmacist, community and will need 30 day card and copay reduction card for whichever [place] dc'd on.

## 2020-06-04 NOTE — Progress Notes (Signed)
Physical Therapy Treatment Patient Details Name: Catherine Hancock MRN: [id number] DOB: Mar 08, 1961 Today's Date: 06/04/2020    History of Present Illness Patient is a 59 y.o. female with PMHx of bronchial asthma, IBS, migraine headaches-who was diagnosed with COVID-19 on 7/19-presented with worsening shortness of breath-found to have acute hypoxic respiratory failure secondary to COVID-19 pneumonia.     PT Comments    Pt was limited today by HA and nausea, but still willing to get up and work a little.  She transferred twice and was able to preform seated exercises on 6L O2 [place] maintaining sats >88%.  She reports compliance with breathing exercises and is hopeful to get back to walking when she feels a bit better.  BPs were soft today, but stable, increasing with exercise.  PT will continue to follow acutely for safe mobility progression.   Follow Up Recommendations  Home health PT     Equipment Recommendations  Other (comment) (home O2)    Recommendations for Other Services       Precautions / Restrictions Precautions Precautions: Other (comment) Precaution Comments: monitor O2, BPs soft/orthostatic    Mobility  Bed Mobility Overal bed mobility: Needs Assistance Bed Mobility: Supine to Sit     Supine to sit: Supervision     General bed mobility comments: supervision for safety and line management, pt moving slowly.   Transfers Overall transfer level: Needs assistance Equipment used: None Transfers: Sit to/from [facility] Sit to Stand: Min guard Stand pivot transfers: Min guard       General transfer comment: Min guard assist for safety and balance to stand and pivot to the [facility] and then to the recliner chair.  Pt preformed her own pericare.  Reports being on lasix now, so up to the [facility] frequently.   Ambulation/Gait             General Gait Details: pt did not feel well enough to walk today d/t HA and nausea.    Stairs              Wheelchair Mobility    Modified Rankin (Stroke Patients Only)       Balance Overall balance assessment: Needs assistance Sitting-balance support: Feet supported;No upper extremity supported Sitting balance-Leahy Scale: Good     Standing balance support: No upper extremity supported Standing balance-Leahy Scale: Fair                              Cognition Arousal/Alertness: Awake/alert Behavior During Therapy: WFL for tasks assessed/performed                                   General Comments: Pt continues to be a bit slow to process, fuzzy.      Exercises General Exercises - Upper Extremity Shoulder Flexion: AROM;Both;10 reps;Seated (inhale with flexion, exhale with ext.) General Exercises - Lower Extremity Ankle Circles/Pumps: AROM;Both;15 reps;Seated Long Arc Quad: AROM;Both;10 reps;Seated Hip Flexion/Marching: AROM;Both;10 reps;Seated Other Exercises Other Exercises: pt reports compliance with IS and flutter valve     General Comments        Pertinent Vitals/Pain Pain Assessment: Faces Faces Pain Scale: Hurts whole lot Pain Location: headache/discomfort from nausea Pain Descriptors / Indicators: Discomfort Pain Intervention(s): Limited activity within patient's tolerance;Monitored during session;Repositioned    Home Living  Prior Function            PT Goals (current goals can now be found in the care plan section) Acute Rehab PT Goals Patient Stated Goal: to go home get back to her family and two dogs Progress towards PT goals: Not progressing toward goals - comment (limited by HA and nausea today)    Frequency    Min 3X/week      PT Plan Current plan remains appropriate    Co-evaluation              AM-PAC PT "6 Clicks" Mobility   Outcome Measure  Help needed turning from your back to your side while in a flat bed without using bedrails?: None Help needed moving from lying  on your back to sitting on the side of a flat bed without using bedrails?: None Help needed moving to and from a bed to a chair (including a wheelchair)?: A Little Help needed standing up from a chair using your arms (e.g., wheelchair or bedside chair)?: A Little Help needed to walk in hospital room?: A Little Help needed climbing 3-5 steps with a railing? : A Little 6 Click Score: 20    End of Session Equipment Utilized During Treatment: Oxygen Activity Tolerance: Other (comment) (limited by HA, dizziness and nausea) Patient left: in chair;with call bell/phone within reach Nurse Communication: Mobility status PT Visit Diagnosis: Muscle weakness (generalized) (M62.81);Difficulty in walking, not elsewhere classified (R26.2);Pain Pain - Right/Left:  (head) Pain - part of body:  (head)     Time: 2707-8675 PT Time Calculation (min) (ACUTE ONLY): 19 min  Charges:  $Therapeutic Activity: 8-22 mins                     Verdene Lennert, PT, DPT  Acute Rehabilitation [phone] pager [phone]) [phone] office

## 2020-06-04 NOTE — Progress Notes (Signed)
Occupational Therapy Treatment Patient Details Name: Catherine Hancock MRN: [id number] DOB: 01-10-1961 Today's Date: 06/04/2020    History of present illness Patient is a 59 y.o. female with PMHx of bronchial asthma, IBS, migraine headaches-who was diagnosed with COVID-19 on 7/19-presented with worsening shortness of breath-found to have acute hypoxic respiratory failure secondary to COVID-19 pneumonia.    OT comments  Patient supine in bed on arrival on 6L O2 via [facility] with SpO2 at 91.  Patient having some nausea prior to sitting up this session, though increased with position change.  BP decrease with position change as well.  Unable to tolerate prolonged standing or walking this session but did stand pivot to chair with min guard.  Completed seated grooming with set up.  Educated on LE exercises for improving BP in seated and practiced, as well as provided education for other ways to lessen orthostatics to improve safety.  Will continue to follow with OT acutely to address the deficits listed below.    Follow Up Recommendations  Home health OT;Supervision - Intermittent    Equipment Recommendations  3 in 1 bedside commode    Recommendations for Other Services      Precautions / Restrictions Precautions Precautions: Other (comment) Precaution Comments: monitor O2, BPs soft/orthostatic       Mobility Bed Mobility Overal bed mobility: Needs Assistance Bed Mobility: Supine to Sit     Supine to sit: Supervision        Transfers Overall transfer level: Needs assistance Equipment used: None Transfers: Sit to/from Stand;Stand Pivot Transfers Sit to Stand: Min guard Stand pivot transfers: Min guard            Balance Overall balance assessment: Mild deficits observed, not formally tested                                         ADL either performed or assessed with clinical judgement   ADL Overall ADL's : Needs assistance/impaired     Grooming: Set  up;Sitting;Wash/dry hands;Oral care Grooming Details (indicate cue type and reason): BP low and nausea limiting standing                              Functional mobility during ADLs: Min guard       Vision       Perception     Praxis      Cognition Arousal/Alertness: Awake/alert Behavior During Therapy: WFL for tasks assessed/performed Overall Cognitive Status: Impaired/Different from baseline                                 General Comments: General "fogginess", processing/responding a little slowly        Exercises Exercises: General Lower Extremity General Exercises - Lower Extremity Ankle Circles/Pumps: AROM;Both;15 reps;Seated Toe Raises: AROM;Both;15 reps;Seated Heel Raises: AROM;Both;15 reps;Seated   Shoulder Instructions       General Comments BP low and nausea limiting    Pertinent Vitals/ Pain       Pain Assessment: No/denies pain  Home Living                                          Prior Functioning/Environment  Frequency  Min 2X/week        Progress Toward Goals  OT Goals(current goals can now be found in the care plan section)  Progress towards OT goals: Progressing toward goals  Acute Rehab OT Goals Patient Stated Goal: to go home get back to her family and two dogs OT Goal Formulation: With patient Time For Goal Achievement: 06/12/20 Potential to Achieve Goals: Good  Plan Discharge plan remains appropriate;Frequency needs to be updated    Co-evaluation                 AM-PAC OT "6 Clicks" Daily Activity     Outcome Measure   Help from another person eating meals?: None Help from another person taking care of personal grooming?: A Little Help from another person toileting, which includes using toliet, bedpan, or urinal?: A Little Help from another person bathing (including washing, rinsing, drying)?: A Little Help from another person to put on and taking off  regular upper body clothing?: A Little Help from another person to put on and taking off regular lower body clothing?: A Little 6 Click Score: 19    End of Session Equipment Utilized During Treatment: Oxygen  OT Visit Diagnosis: Muscle weakness (generalized) (M62.81);Other (comment) (decreased activity tolerance)   Activity Tolerance Treatment limited secondary to medical complications (Comment) (nausea, low BP)   Patient Left in chair;with call bell/phone within reach   Nurse Communication Mobility status        Time: 3128-1188 OT Time Calculation (min): 23 min  Charges: OT General Charges $OT Visit: 1 Visit OT Treatments $Self Care/Home Management : 8-22 mins $Therapeutic Activity: 8-22 mins  Catherine Hancock, OTR/L    Catherine Hancock 06/04/2020, 10:45 AM

## 2020-06-04 NOTE — TOC Benefit Eligibility Note (Signed)
Transition of Care [facility]) Benefit Eligibility Note    Patient Details  Name: Catherine Hancock MRN: [id number] Date of Birth: September 19, 1961   Medication/Dose: Alveda Reasons  15 MG BID  XARELTO 20 MG DAILY  ELIQUIS  5 MG BID  ELIQUIS 2.5 MG BID  Covered?: Yes  Tier:  (PREFERRED)  Prescription Coverage Preferred Pharmacy: Roseanne Kaufman with Person/Company/Phone Number:: [facility]  @ [place] [facility] # [phone]  Co-Pay: $40.00  Prior Approval: Yes [phone])  Deductible:  (NO DEDUCTIBLE  WITH PLAN)  Additional Notes: ELIQUIS 10 MG : NON-FORMULARY    Memory Argue Phone Number: 06/04/2020, 11:27 AM

## 2020-06-05 LAB — BASIC METABOLIC PANEL
Anion gap: 11 (ref 5–15)
BUN: 21 mg/dL — ABNORMAL HIGH (ref 6–20)
CO2: 27 mmol/L (ref 22–32)
Calcium: 8.7 mg/dL — ABNORMAL LOW (ref 8.9–10.3)
Chloride: 103 mmol/L (ref 98–111)
Creatinine, Ser: 0.78 mg/dL (ref 0.44–1.00)
GFR calc Af Amer: >60 mL/min (ref >60)
GFR calc non Af Amer: >60 mL/min (ref >60)
Glucose, Bld: 102 mg/dL — ABNORMAL HIGH (ref 70–99)
Potassium: 3.8 mmol/L (ref 3.5–5.1)
Sodium: 141 mmol/L (ref 135–145)

## 2020-06-05 LAB — D-DIMER, QUANTITATIVE: D-Dimer, Quant: 1.51 ug/mL-FEU — ABNORMAL HIGH (ref 0.00–0.50)

## 2020-06-05 LAB — MAGNESIUM: Magnesium: 2.3 mg/dL (ref 1.7–2.4)

## 2020-06-05 MED ORDER — GUAIFENESIN-DM 100-10 MG/5ML PO SYRP: 5.0000 mL | ORAL_SOLUTION | Freq: Four times a day (QID) | ORAL | 0 refills | Status: DC | PRN

## 2020-06-05 MED ORDER — ALBUTEROL SULFATE HFA 108 (90 BASE) MCG/ACT IN AERS: 2.0000 | INHALATION_SPRAY | Freq: Four times a day (QID) | RESPIRATORY_TRACT | 0 refills | Status: AC

## 2020-06-05 MED ORDER — ONDANSETRON HCL 4 MG PO TABS: 4.0000 mg | ORAL_TABLET | Freq: Four times a day (QID) | ORAL | 0 refills | Status: DC | PRN

## 2020-06-05 NOTE — Discharge Summary (Addendum)
Physician Discharge Summary  Catherine Hancock ERX:[id number] DOB: 1961-04-10 DOA: 05/27/2020  PCP: Tobe Sos, MD  Admit date: 05/27/2020 Discharge date: 06/05/2020  Admitted From: Home  Disposition:  Home  Recommendations for Outpatient Follow-up and new medication changes:  1. Follow up with Dr. Shon Millet in 2 weeks.  2. Continue self quarantine for 2 weeks, use mask in public and maintain physical distancing. 3. Continue as needed albuterol and guaifenesin.   Home Health: no   Equipment/Devices: home 02 5 L/ min    Discharge Condition: stable CODE STATUS: full  Diet recommendation: regular   Brief/Interim Summary: Patient admitted to the hospital with a working diagnosis of acute hypoxic respiratory failure due to SARS COVID-19 viral pneumonia.  59 year old female with past medical history of asthma, hypothyroidism, depression, migraines and sleep apnea. She was diagnosed with SARS COVID-19 July 20. Her symptoms were consistent with cough, chest tightness, wheezing, abdominal pain, nausea, headaches, loss of taste and smell. Patient was treated as an outpatient with over-the-counter medications along with steroids and azithromycin. Her symptoms were persistent, including fever that prompted her to come to the hospital. On her initial physical examination her temperature was 100.3 F, respiratory rate18-29, oxygen saturation 86% on room air, heart rate 77. She had pulmonary rales bilaterally, diffuse, no wheezing, heart S1-S2, present rhythmic, soft abdomen, no lower extremity edema. Sodium 137, potassium 4.0, chloride 103, bicarb 24, glucose 112, BUN 21, creatinine 0.73 white count 8.7, hemoglobin 11.4, hematocrit 35.2, platelets 236. SARS COVID-19 positive. Chest radiograph had bilateral interstitial infiltrates, left upper lobe, right lower lobe and right upper lobe.  Patient was admitted to the telemetry ward, shewastreated with systemic intravenous steroids along with  intravenous remdesivir.  Her symptoms slowly improved along with her inflammatory markers.    1.  Acute hypoxic respiratory failure due to SARS COVID-19 viral pneumonia.  Patient received medical therapy with systemic corticosteroids, intravenous remdesivir and supplemental oxygen per nasal cannula. She was treated with bronchodilators, antitussive agents and airway clearing techniques with incentive spirometer and flutter valve.  Underwent further work-up with ultrasonography lower extremities which was negative for deep vein thrombosis.  She required 1 dose of furosemide for noncardiogenic pulmonary edema related to her viral pneumonia.  Her symptoms, oxygenation and inflammatory markers improved.  Patient will continue supplemental oxygen per nasal cannula, 5 L/min to keep oxygen saturation greater than 88%.  Follow-up with Dr. Shon Millet in 2 weeks.   2.  Asthma.  Patient had no signs of acute exacerbation, she was continue on inhaled corticosteroids and long-acting beta-2 agonist.  Continue montelukast.  3.  Depression/anxiety.  Continue Celexa and as needed alprazolam.  4.  Hypothyroidism.  Continue levothyroxine.  5.  Obstructive sleep apnea.  Patient will resume CPAP at home.  6.  Migraines.  Remained stable, continue topiramate.  7.  Hypokalemia.  During her hospitalization patient experienced nausea, poor oral intake.  Her kidney function remained stable.  Her potassium was corrected with potassium chloride.  At discharge sodium 141, potassium 3.8, chloride 103, bicarb 27, glucose 102, bicarb 21, creatinine 0.78.      Discharge Diagnoses:  Principal Problem:   Pneumonia due to COVID-19 virus Active Problems:   Migraine headache   Hypothyroidism   Asthma   OSA on CPAP   Depression    Discharge Instructions  Discharge Instructions    Diet - low sodium heart healthy   Complete by: As directed    Discharge instructions   Complete by: As directed  Please follow  with primary care in 14 days, continue self quarantine for 2 more weeks, use mask in public and maintain physical distancing.   For home use only DME oxygen   Complete by: As directed    Length of Need: 6 Months   Mode or (Route): Nasal cannula   Liters per Minute: 5   Frequency: Continuous (stationary and portable oxygen unit needed)   Oxygen conserving device: Yes   Oxygen delivery system: Gas   Increase activity slowly   Complete by: As directed    MyChart COVID-19 home monitoring program   Complete by: Jun 05, 2020    Is the patient willing to use the [facility] for home monitoring?: Yes   Temperature monitoring   Complete by: Jun 05, 2020    After how many days would you like to receive a notification of this patient's flowsheet entries?: 1     Allergies as of 06/05/2020      Reactions   Gadolinium Derivatives Other (See Comments)   Severe pain, joints locked up   Isosorbide Nitrate Anaphylaxis   Latex Other (See Comments), Shortness Of Breath   Asthma attack   Other Shortness Of Breath, Other (See Comments)   Severe pain, joints locked up gadolinium contrast-causes joints to lock up, severe pain Severe pain, joints locked up gadolinium contrast-causes joints to lock up, severe pain Asthma attack   Fluticasone-salmeterol Other (See Comments)   Blurry vision Blurry vision Blurry vision Blurry vision Blurry vision   Contrast Media [iodinated Diagnostic Agents]    gadolinium contrast-causes joints to lock up, severe pain   Sulfamethoxazole-trimethoprim Diarrhea, Nausea And Vomiting   REACTION: GI upset Other reaction(s): OTHER REACTION: GI upset Other reaction(s): OTHER Other reaction(s): OTHER      Medication List    STOP taking these medications   amoxicillin 500 MG tablet Commonly known as: AMOXIL   methylPREDNISolone 4 MG Tbpk tablet Commonly known as: MEDROL DOSEPAK     TAKE these medications   albuterol 108 (90 Base) MCG/ACT inhaler Commonly  known as: VENTOLIN HFA Inhale 2 puffs into the lungs every 6 (six) hours. What changed:   how to take this  when to take this  reasons to take this   ALPRAZolam 0.5 MG tablet Commonly known as: XANAX Take 0.25 mg by mouth as directed. PRN   aspirin EC 81 MG tablet Take 1 tablet (81 mg total) by mouth daily. What changed: Another medication with the same name was removed. Continue taking this medication, and follow the directions you see here.   BIOTIN PO Take 1 tablet by mouth as directed.   budesonide-formoterol 80-4.5 MCG/ACT inhaler Commonly known as: SYMBICORT Inhale 1 puff into the lungs 2 (two) times daily as needed (asthma).   CALCIUM PO Take 1 tablet by mouth as directed.   citalopram 20 MG tablet Commonly known as: CELEXA Take 20 mg by mouth daily.   cyclobenzaprine 10 MG tablet Commonly known as: FLEXERIL TAKE 1 TABLET THREE TIMES A DAY AS NEEDED FOR MUSCLE SPASMS What changed: See the new instructions.   Donnatal 16.2 MG tablet Generic drug: belladona alk-PHENObarbital Take 16.2 mg by mouth daily as needed (Stomach pain).   EPINEPHrine 0.3 mg/0.3 mL Soaj injection Commonly known as: EPI-PEN Inject 0.3 mg into the muscle as needed for anaphylaxis.   esomeprazole 40 MG capsule Commonly known as: NEXIUM Take 40 mg by mouth 2 (two) times daily before a meal.   guaiFENesin-dextromethorphan 100-10 MG/5ML syrup Commonly  known as: ROBITUSSIN DM Take 5 mLs by mouth every 6 (six) hours as needed for cough.   HYDROcodone-acetaminophen 5-325 MG tablet Commonly known as: NORCO/VICODIN Take 1 tablet by mouth every 6 (six) hours as needed for moderate pain.   ibuprofen 800 MG tablet Commonly known as: ADVIL Take 800 mg by mouth every 8 (eight) hours as needed (pain).   Nexa Plus 29-1.25-350 MG Caps Take 1 tablet by mouth Daily.   nitroGLYCERIN 0.4 MG SL tablet Commonly known as: NITROSTAT Place 1 tablet (0.4 mg total) under the tongue every 5 (five)  minutes as needed for chest pain.   ondansetron 4 MG tablet Commonly known as: ZOFRAN Take 1 tablet (4 mg total) by mouth every 6 (six) hours as needed for nausea.   polyethylene glycol powder 17 GM/SCOOP powder Commonly known as: GLYCOLAX/MIRALAX Take 17 g by mouth as needed for mild constipation.   Singulair 10 MG tablet Generic drug: montelukast Take 10 mg by mouth at bedtime.   Synthroid 75 MCG tablet Generic drug: levothyroxine Take 75 mcg by mouth daily.   topiramate 50 MG tablet Commonly known as: TOPAMAX Take 50-100 mg by mouth See admin instructions. '50mg'$  in the morning and '100mg'$  by mouth at night   Vitamin D (Ergocalciferol) 1.25 MG (50000 UNIT) Caps capsule Commonly known as: DRISDOL Take 50,000 Units by mouth once a week.   VITAMIN D3 PO Take 25,000 Units by mouth once a week.   zinc gluconate 50 MG tablet Take 50 mg by mouth daily.   zolpidem 10 MG tablet Commonly known as: AMBIEN Take 0.5 tablets by mouth at bedtime as needed for sleep.            Durable Medical Equipment  (From admission, onward)         Start     Ordered   06/05/20 0000  For home use only DME oxygen       Question Answer Comment  Length of Need 6 Months   Mode or (Route) Nasal cannula   Liters per Minute 5   Frequency Continuous (stationary and portable oxygen unit needed)   Oxygen conserving device Yes   Oxygen delivery system Gas      06/05/20 1025          Allergies  Allergen Reactions  . Gadolinium Derivatives Other (See Comments)    Severe pain, joints locked up  . Isosorbide Nitrate Anaphylaxis  . Latex Other (See Comments) and Shortness Of Breath    Asthma attack   . Other Shortness Of Breath and Other (See Comments)    Severe pain, joints locked up gadolinium contrast-causes joints to lock up, severe pain Severe pain, joints locked up gadolinium contrast-causes joints to lock up, severe pain  Asthma attack  . Fluticasone-Salmeterol Other (See  Comments)    Blurry vision Blurry vision Blurry vision Blurry vision Blurry vision  . Contrast Media [Iodinated Diagnostic Agents]     gadolinium contrast-causes joints to lock up, severe pain  . Sulfamethoxazole-Trimethoprim Diarrhea and Nausea And Vomiting    REACTION: GI upset Other reaction(s): OTHER REACTION: GI upset Other reaction(s): OTHER Other reaction(s): OTHER      Procedures/Studies: DG Chest Port 1 View  Result Date: 06/04/2020 CLINICAL DATA:  Shortness of breath.  COVID-19 positive EXAM: PORTABLE CHEST 1 VIEW COMPARISON:  May 27, 2020 FINDINGS: There is airspace opacity throughout the lungs bilaterally with increase in ill-defined opacity and mild consolidation in each mid lung region. Heart is upper normal in  size, stable, with pulmonary vascularity normal. No adenopathy. No bone lesions. IMPRESSION: Multifocal airspace opacity with overall increase in opacity in the mid lung regions. Suspect multifocal pneumonia, likely of atypical organism etiology given the history. Given relative consolidation in the mid lung regions bilaterally, superimposed bacterial pneumonia cannot be excluded. Stable cardiac silhouette.  No evident adenopathy. Electronically Signed   By: Lowella Grip III M.D.   On: 06/04/2020 11:05   DG Chest Portable 1 View  Result Date: 05/27/2020 CLINICAL DATA:  COVID-19 positivity with cough and fevers EXAM: PORTABLE CHEST 1 VIEW COMPARISON:  10/30/2019 FINDINGS: Cardiac shadow is within normal limits. Patchy opacities are noted in both lungs. The lungs are hypoinflated. No sizable effusion is seen. No bony abnormality is noted. IMPRESSION: Patchy bilateral airspace opacities consistent with the given clinical history. Electronically Signed   By: Inez Catalina M.D.   On: 05/27/2020 21:35   VAS [place] LOWER EXTREMITY VENOUS (DVT)  Result Date: 06/04/2020  Lower Venous DVTStudy Indications: Edema.  Comparison Study: No prior studies. Performing Technologist:  Darlin Coco  Examination Guidelines: A complete evaluation includes B-mode imaging, spectral Doppler, color Doppler, and power Doppler as needed of all accessible portions of each vessel. Bilateral testing is considered an integral part of a complete examination. Limited examinations for reoccurring indications may be performed as noted. The reflux portion of the exam is performed with the patient in reverse Trendelenburg.  +---------+---------------+---------+-----------+----------+--------------+ RIGHT    CompressibilityPhasicitySpontaneityPropertiesThrombus Aging +---------+---------------+---------+-----------+----------+--------------+ CFV      Full           Yes      Yes                                 +---------+---------------+---------+-----------+----------+--------------+ SFJ      Full                                                        +---------+---------------+---------+-----------+----------+--------------+ FV Prox  Full                                                        +---------+---------------+---------+-----------+----------+--------------+ FV Mid   Full                                                        +---------+---------------+---------+-----------+----------+--------------+ FV DistalFull                                                        +---------+---------------+---------+-----------+----------+--------------+ PFV      Full                                                        +---------+---------------+---------+-----------+----------+--------------+  POP      Full           Yes      Yes                                 +---------+---------------+---------+-----------+----------+--------------+ PTV      Full                                                        +---------+---------------+---------+-----------+----------+--------------+ PERO     Full                                                         +---------+---------------+---------+-----------+----------+--------------+   +---------+---------------+---------+-----------+----------+--------------+ LEFT     CompressibilityPhasicitySpontaneityPropertiesThrombus Aging +---------+---------------+---------+-----------+----------+--------------+ CFV      Full           Yes      Yes                                 +---------+---------------+---------+-----------+----------+--------------+ SFJ      Full                                                        +---------+---------------+---------+-----------+----------+--------------+ FV Prox  Full                                                        +---------+---------------+---------+-----------+----------+--------------+ FV Mid   Full                                                        +---------+---------------+---------+-----------+----------+--------------+ FV DistalFull                                                        +---------+---------------+---------+-----------+----------+--------------+ PFV      Full                                                        +---------+---------------+---------+-----------+----------+--------------+ POP      Full           Yes      Yes                                 +---------+---------------+---------+-----------+----------+--------------+  PTV      Full                                                        +---------+---------------+---------+-----------+----------+--------------+ PERO     Full                                                        +---------+---------------+---------+-----------+----------+--------------+     Summary: RIGHT: - There is no evidence of deep vein thrombosis in the lower extremity.  - No cystic structure found in the popliteal fossa.  LEFT: - There is no evidence of deep vein thrombosis in the lower extremity.  - No cystic structure found in the popliteal fossa.   *See table(s) above for measurements and observations. Electronically signed by Harold Barban MD on 06/04/2020 at 4:54:15 PM.    Final        Subjective: Patient is feeling better her dyspnea continue to improve, no nausea or vomiting ,no chest pain.   Discharge Exam: Vitals:   06/05/20 0531 06/05/20 0804  BP: 111/66 105/63  Pulse: (!) 59 73  Resp: 20 16  Temp: 98.8 F (37.1 C) 98.2 F (36.8 C)  SpO2: 97% 94%   Vitals:   06/04/20 1200 06/04/20 1442 06/05/20 0531 06/05/20 0804  BP: 108/62 108/69 111/66 105/63  Pulse: 64 76 (!) 59 73  Resp:  '18 20 16  '$ Temp: 97.8 F (36.6 C) 97.7 F (36.5 C) 98.8 F (37.1 C) 98.2 F (36.8 C)  TempSrc:  Oral Oral Oral  SpO2: 100% 98% 97% 94%  Weight:      Height:        General: not in pain or dyspnea Neurology: Awake and alert, non focal  E ENT: mild pallor, no icterus, oral mucosa moist Cardiovascular: No JVD. S1-S2 present, rhythmic, no gallops, rubs, or murmurs. No lower extremity edema. Pulmonary: vesicular breath sounds bilaterally, adequate air movement, no wheezing, rhonchi or rales. Gastrointestinal. Abdomen soft and non-tender Skin. No rashes Musculoskeletal: no joint deformities   The results of significant diagnostics from this hospitalization (including imaging, microbiology, ancillary and laboratory) are listed below for reference.     Microbiology: Recent Results (from the past 240 hour(s))  SARS Coronavirus 2 by RT PCR (hospital order, performed in [facility] hospital lab) Nasopharyngeal Nasopharyngeal Swab     Status: Abnormal   Collection Time: 05/28/20  5:36 AM   Specimen: Nasopharyngeal Swab  Result Value Ref Range Status   SARS Coronavirus 2 POSITIVE (A) NEGATIVE Final    Comment: RESULT CALLED TO, READ BACK BY AND VERIFIED WITH: RN MEGAN R. BY MESSAN H. AT [id number] ON 05/28/2020 (NOTE) SARS-CoV-2 target nucleic acids are DETECTED  SARS-CoV-2 RNA is generally detectable in upper respiratory specimens  during  the acute phase of infection.  Positive results are indicative  of the presence of the identified virus, but do not rule out bacterial infection or co-infection with other pathogens not detected by the test.  Clinical correlation with patient history and  other diagnostic information is necessary to determine patient infection status.  The expected result is negative.  Fact Sheet for Patients:   StrictlyIdeas.no  Fact Sheet for Healthcare Providers:   BankingDealers.co.za    This test is not yet approved or cleared by the [place] FDA and  has been authorized for detection and/or diagnosis of SARS-CoV-2 by FDA under an Emergency Use Authorization (EUA).  This EUA will remain in effect (mean ing this test can be used) for the duration of  the COVID-19 declaration under Section 564(b)(1) of the Act, 21 U.S.C. section 360-bbb-3(b)(1), unless the authorization is terminated or revoked sooner.  Performed at [facility], [place] [address], [place], [place] [address]   Blood Culture (routine x 2)     Status: None   Collection Time: 05/28/20  7:57 AM   Specimen: BLOOD  Result Value Ref Range Status   Specimen Description BLOOD LEFT ANTECUBITAL  Final   Special Requests   Final    BOTTLES DRAWN AEROBIC AND ANAEROBIC Blood Culture adequate volume   Culture   Final    NO GROWTH 5 DAYS Performed at [facility], [place] [address], [place], [place] [address]    Report Status 06/02/2020 FINAL  Final  Blood Culture (routine x 2)     Status: None   Collection Time: 05/28/20  8:05 AM   Specimen: BLOOD  Result Value Ref Range Status   Specimen Description BLOOD SITE NOT SPECIFIED  Final   Special Requests   Final    BOTTLES DRAWN AEROBIC AND ANAEROBIC Blood Culture results may not be optimal due to an excessive volume of blood received in culture bottles   Culture   Final    NO GROWTH 5 DAYS Performed at [facility],  [place] [address] [place], [place] [address]    Report Status 06/02/2020 FINAL  Final  MRSA PCR Screening     Status: None   Collection Time: 05/28/20 11:51 PM   Specimen: Nasal Mucosa; Nasopharyngeal  Result Value Ref Range Status   MRSA by PCR NEGATIVE NEGATIVE Final    Comment:        The GeneXpert MRSA Assay (FDA approved for NASAL specimens only), is one component of a comprehensive MRSA colonization surveillance program. It is not intended to diagnose MRSA infection nor to guide or monitor treatment for MRSA infections. Performed at [facility], [place] [address], [place],  [address]      Labs: BNP (last 3 results) No results for input(s): BNP in the last 8760 hours. Basic Metabolic Panel: Recent Labs  Lab 06/01/20 0857 06/02/20 0352 06/03/20 0521 06/04/20 0522 06/05/20 0354  NA 142 141 139 141 141  K 3.6 4.0 3.1* 3.5 3.8  CL 106 105 104 105 103  CO2 '26 26 27 27 27  '$ GLUCOSE 89 135* 102* 100* 102*  BUN 24* 26* 21* 19 21*  CREATININE 0.70 0.74 0.70 0.79 0.78  CALCIUM 8.6* 8.4* 8.5* 8.5* 8.7*  MG  --   --   --   --  2.3   Liver Function Tests: Recent Labs  Lab 05/30/20 0508 05/31/20 0517 06/01/20 0857 06/02/20 0352  AST 30 38 32 33  ALT '22 30 29 31  '$ ALKPHOS 86 77 73 72  BILITOT 0.5 0.6 0.6 0.4  PROT 6.4* 6.2* 5.6* 5.7*  ALBUMIN 2.5* 2.4* 2.5* 2.6*   No results for input(s): LIPASE, AMYLASE in the last 168 hours. No results for input(s): AMMONIA in the last 168 hours. CBC: Recent Labs  Lab 05/30/20 0508 05/31/20 0517 06/01/20 0857 06/02/20 0352  WBC 5.4 7.4 8.5  8.9  NEUTROABS 4.2 5.8 5.8 7.2  HGB 10.8* 11.0* 11.7* 11.5*  HCT 33.7* 33.9* 35.3* 35.0*  MCV 92.1 91.6 90.5 91.1  PLT 301 339 404* 367   Cardiac Enzymes: No results for input(s): CKTOTAL, CKMB, CKMBINDEX, TROPONINI in the last 168 hours. BNP: Invalid input(s): POCBNP CBG: No results for input(s): GLUCAP in the last 168 hours. D-Dimer Recent Labs    06/04/20 0522  06/05/20 0354  DDIMER 1.76* 1.51*   Hgb A1c No results for input(s): HGBA1C in the last 72 hours. Lipid Profile No results for input(s): CHOL, HDL, LDLCALC, TRIG, CHOLHDL, LDLDIRECT in the last 72 hours. Thyroid function studies No results for input(s): TSH, T4TOTAL, T3FREE, THYROIDAB in the last 72 hours.  Invalid input(s): FREET3 Anemia work up [employer]    06/03/20 0521  FERRITIN 228   Urinalysis No results found for: COLORURINE, APPEARANCEUR, LABSPEC, [place], GLUCOSEU, [place], [place], [place], PROTEINUR, UROBILINOGEN, NITRITE, LEUKOCYTESUR Sepsis Labs Invalid input(s): PROCALCITONIN,  WBC,  LACTICIDVEN Microbiology Recent Results (from the past 240 hour(s))  SARS Coronavirus 2 by RT PCR (hospital order, performed in [facility] hospital lab) Nasopharyngeal Nasopharyngeal Swab     Status: Abnormal   Collection Time: 05/28/20  5:36 AM   Specimen: Nasopharyngeal Swab  Result Value Ref Range Status   SARS Coronavirus 2 POSITIVE (A) NEGATIVE Final    Comment: RESULT CALLED TO, READ BACK BY AND VERIFIED WITH: RN MEGAN R. BY MESSAN H. AT [phone] ON 05/28/2020 (NOTE) SARS-CoV-2 target nucleic acids are DETECTED  SARS-CoV-2 RNA is generally detectable in upper respiratory specimens  during the acute phase of infection.  Positive results are indicative  of the presence of the identified virus, but do not rule out bacterial infection or co-infection with other pathogens not detected by the test.  Clinical correlation with patient history and  other diagnostic information is necessary to determine patient infection status.  The expected result is negative.  Fact Sheet for Patients:   StrictlyIdeas.no   Fact Sheet for Healthcare Providers:   BankingDealers.co.za    This test is not yet approved or cleared by the [place] FDA and  has been authorized for detection and/or diagnosis of SARS-CoV-2 by FDA under an Emergency  Use Authorization (EUA).  This EUA will remain in effect (mean ing this test can be used) for the duration of  the COVID-19 declaration under Section 564(b)(1) of the Act, 21 U.S.C. section 360-bbb-3(b)(1), unless the authorization is terminated or revoked sooner.  Performed at [facility], [place] [address] [place], [place] [address]   Blood Culture (routine x 2)     Status: None   Collection Time: 05/28/20  7:57 AM   Specimen: BLOOD  Result Value Ref Range Status   Specimen Description BLOOD LEFT ANTECUBITAL  Final   Special Requests   Final    BOTTLES DRAWN AEROBIC AND ANAEROBIC Blood Culture adequate volume   Culture   Final    NO GROWTH 5 DAYS Performed at [facility], [place] [address] [place], [place] [address]    Report Status 06/02/2020 FINAL  Final  Blood Culture (routine x 2)     Status: None   Collection Time: 05/28/20  8:05 AM   Specimen: BLOOD  Result Value Ref Range Status   Specimen Description BLOOD SITE NOT SPECIFIED  Final   Special Requests   Final    BOTTLES DRAWN AEROBIC AND ANAEROBIC Blood Culture results may not be optimal due to an excessive volume  of blood received in culture bottles   Culture   Final    NO GROWTH 5 DAYS Performed at [facility], [place] [address] [place], [place] [address]    Report Status 06/02/2020 FINAL  Final  MRSA PCR Screening     Status: None   Collection Time: 05/28/20 11:51 PM   Specimen: Nasal Mucosa; Nasopharyngeal  Result Value Ref Range Status   MRSA by PCR NEGATIVE NEGATIVE Final    Comment:        The GeneXpert MRSA Assay (FDA approved for NASAL specimens only), is one component of a comprehensive MRSA colonization surveillance program. It is not intended to diagnose MRSA infection nor to guide or monitor treatment for MRSA infections. Performed at [facility], [place] [address], [place], [place] [address]      Time coordinating discharge: 45 minutes  SIGNED:   Tawni Millers, MD  [facility] 06/05/2020, 10:26 AM

## 2020-06-05 NOTE — Progress Notes (Signed)
SATURATION QUALIFICATIONS: (This note is used to comply with regulatory documentation for home oxygen)  Patient Saturations on Room Air at Rest = 89%  Patient Saturations on Room Air while Ambulating = 85%  Patient Saturations on 5 Liters of oxygen while Ambulating = 91%  Please briefly explain why patient needs home oxygen:  Patient's sats drop to 85% on RA during ambulating.

## 2020-06-05 NOTE — TOC Progression Note (Signed)
Transition of Care [facility]) - Progression Note    Patient Details  Name: Catherine Hancock MRN: [id number] Date of Birth: 1961-10-12  Transition of Care [facility]) CM/SW [place], RN Phone Number: [phone]  06/05/2020, 12:15 PM  Clinical Narrative:   Damaris Schooner with Katie at [facility] who verified that all information has been recieved. Joellen Jersey confirms that portable tank will be delivered to the patients room but can not give a specific time for that delivery.     Expected Discharge Plan: [place] Barriers to Discharge: Continued Medical Work up  Expected Discharge Plan and Services Expected Discharge Plan: [place] Choice: Home Health, Durable Medical Equipment   Expected Discharge Date: 06/05/20                                     Social Determinants of Health (SDOH) Interventions    Readmission Risk Interventions No flowsheet data found.

## 2020-06-05 NOTE — TOC Progression Note (Signed)
Transition of Care [facility]) - Progression Note    Patient Details  Name: Catherine Hancock MRN: [id number] Date of Birth: 1961/02/18  Transition of Care [facility]) CM/SW [place], RN Phone Number: [phone]  06/05/2020, 12:27 PM  Clinical Narrative:    CM received call from [place] at [facility] stating that it would be tonight before someone would be able to get tank to the hospital and wanting to know if patient has a family member that would be able to pick the tank up. CM called patient who confirms that her husband could go by the office and pick up the tank. Patient to be discharged with portable tank and DME company to meet them at the home with O2 concentrator. Bedside nurse to be updated.    Expected Discharge Plan: [place] Barriers to Discharge: Continued Medical Work up  Expected Discharge Plan and Services Expected Discharge Plan: [place] Choice: Home Health, Durable Medical Equipment   Expected Discharge Date: 06/05/20                                     Social Determinants of Health (SDOH) Interventions    Readmission Risk Interventions No flowsheet data found.

## 2020-06-05 NOTE — TOC Progression Note (Addendum)
Transition of Care [facility]) - Progression Note    Patient Details  Name: Catherine Hancock MRN: [id number] Date of Birth: 04-22-1961  Transition of Care [facility]) CM/SW [place], RN Phone Number: 06/05/2020, 11:08 AM  Clinical Narrative:  CM consulted to set up home O2. Patient has requested O2 services from [facility]. CM has called [facility] care and been made aware that they will need to received a copy of orders and additional testing results In order to deliver home oxygen. Ambulatory sat screening has been requested. Primary nurse made aware. Will set up home O2 once this information is available.   1130 O2 order and saturation note have been faxed to [facility].   Expected Discharge Plan: [place] Barriers to Discharge: Continued Medical Work up  Expected Discharge Plan and Services Expected Discharge Plan: [place] Choice: Home Health, Durable Medical Equipment   Expected Discharge Date: 06/05/20                                     Social Determinants of Health (SDOH) Interventions    Readmission Risk Interventions No flowsheet data found.

## 2020-06-13 ENCOUNTER — Encounter (INDEPENDENT_AMBULATORY_CARE_PROVIDER_SITE_OTHER): Payer: Self-pay

## 2020-06-16 NOTE — Progress Notes (Signed)
Sorry for super delay with cpst report. She was supposed to see an app to go over results. Not sure what happened buyt CPST in April 2021 was c/w EIB - exercise asthma. Looks like since then she got admitted for covid. She can visit one of our apps for post covid if she wants.

## 2020-06-18 NOTE — Progress Notes (Signed)
Called and spoke with patient about cardiopulmonary exercise results per Dr Chase Caller. Offered to make a follow up appointment but patient currently declined making one today as she is still recovering and she stated she has a follow up appointment already scheduled with her PCP. Patient also stated she would call back to schedule if she wanted to be seen here at our offices. All questions answered and patient expressed understanding of results. Nothing further needed at this time.

## 2020-07-21 ENCOUNTER — Ambulatory Visit (INDEPENDENT_AMBULATORY_CARE_PROVIDER_SITE_OTHER): Payer: BC Managed Care – PPO | Admitting: Acute Care

## 2020-07-21 ENCOUNTER — Other Ambulatory Visit: Payer: Self-pay

## 2020-07-21 ENCOUNTER — Encounter: Payer: Self-pay | Admitting: Acute Care

## 2020-07-21 VITALS — BP 110/70 | HR 87 | Temp 97.3°F | Ht 60.0 in | Wt 156.6 lb

## 2020-07-21 DIAGNOSIS — R9389 Abnormal findings on diagnostic imaging of other specified body structures: Secondary | ICD-10-CM

## 2020-07-21 DIAGNOSIS — J45909 Unspecified asthma, uncomplicated: Secondary | ICD-10-CM | POA: Diagnosis not present

## 2020-07-21 MED ORDER — PREDNISONE 10 MG PO TABS: ORAL_TABLET | ORAL | 0 refills | Status: DC

## 2020-07-21 NOTE — Patient Instructions (Addendum)
It is good to see you today. We will walk you in the office today. Remember to use your oxygen at 2L [place]. Saturation Goal is > 88% Remember to use at bedtime with you CPAP machine Continue  Symbicort as you have been doing. Continue your neb treatments 4 times daily, as you have been doing We will prescribe a prednisone taper  Prednisone taper; 10 mg tablets: 4 tabs x 2 days, 3 tabs x 2 days, 2 tabs x 2 days 1 tab x 2 days then stop. We will get a CT Chest without contrast. Follow up in 2 weeks with Dr. Chase Caller or Judson Roch NP to see you are doing.  Please contact office for sooner follow up if symptoms do not improve or worsen or seek emergency care

## 2020-07-21 NOTE — Progress Notes (Signed)
History of Present Illness EVELY GAINEY is a 59 y.o. female with asthma , OSA on CPAP,  right pulmonary nodule,  Chronic cough and dyspnea. She had Covid 19 05/2020. She is continuing to have dyspnea post Covid. She is followed by Dr. 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  Self-referred for shortness of breath and cough.  She tells me that she has had a lifelong history of asthma for which she is on Symbicort and Singulair on a scheduled basis.  Then in 2018 Easter she had influenza treated with Tamiflu as an outpatient in [facility] after family cluster.  Since then she has had cough that has persisted all along.  The cough is mild and persistent.  Never really improved.  Then in early 2020 she developed insidious onset of shortness of breath that has progressed.  She tells me she can get short of breath anytime.  At night she can wake up occasionally because of the cough but never because of shortness of breath.  There is no orthopnea proximal nocturnal dyspnea but in the daytime she perceives dyspnea for anything and randomly.  She says that when she walks definitely the shortness of breath is worse and relieved by rest there is no associated chest pain.     07/21/2020  Pt. Presents for follow up after COVID 19 pneumonitis requiring hospitalization for acute hypoxic respiratory failure 05/27/2020- 06/05/2020 . She was treated with Remdesivir, Steroids and high flow oxygen. She states she has had drops in her oxygen saturations at home, and that she has had 4 asthma exacerbations since she was discharged from the hospital.She was discharged on cough syrup only, medication for nausea . No home prednisone. She was discharged with oxygen, which she states she uses at night and sometimes during the day when she feels like she needs it. She states she has had a low grade fever since discharge , last  time was a few days after she came home. She does check her temperature every day. She is not actively coughing anything up. She states she does have some intermittent chest pain which she takes extra strength Tylenol for. She has had increase in her asthma flares since discharge. She did not go and see a physician for any of them, she used her inhalers which she states resolved the flare. She states she has been compliant with her Symbicort daily. She is using her rescue inhaler every 4-6 hours since getting home from the hospital. She states her asthma has been much worse in the last 3 years.  She did go and see her PCP last week( 9/13) CXR showed worsening infiltrates, and ESR was 80 at follow up. She was not prescribed any medications.  WBC at the time was 6.7   She states she is compliant with her CPAP. She feels she has continued benefit from treatment. No daytime sleepiness, or morning headaches.  She states she has been having brain fog , and muscle spasms and nerve pain since her diagnosis with Covid.  She states she is not going to get the Covid 19 vaccine. She states it is her opinion it was developed too quickly. She is willing to take the risk of remaining unvaccinated. She  is aware that her asthma makes her an even higher risk of poor outcome should she get infected again with another variant.   While patient has  a contraindication listed to prednisone, she states she has taken it many times and has had no problems with the medication. She would like to have a prednisone taper.   Test Results: CXR 07/14/2020 Mild interstitial opacities increased since prior exam.   Allergy and autoimmuned  Results for SUPRENA, TRAVAGLINI (MRN [id number]) as of 01/14/2020 10:56  Ref. Range 03/12/2015 11:59 10/08/2019 12:01  Sheep Sorrel IgE Latest Units: kU/L  <0.10  Pecan/Hickory Tree IgE Latest Units: kU/L  <0.10  IgE (Immunoglobulin E), Serum Latest Ref Range: <OR=114 kU/L  35  Allergen, D  pternoyssinus,d7 Latest Units: kU/L  <0.10  Cat Dander Latest Units: kU/L  <0.10  Dog Dander Latest Units: kU/L  <0.10  [place] Grass Latest Units: kU/L  <0.10  Johnson Grass Latest Units: kU/L  <0.10  Timothy Grass Latest Units: kU/L  <0.10  Cockroach Latest Units: kU/L  <0.10  Aspergillus fumigatus, m3 Latest Units: kU/L  <0.10  Allergen, Comm Silver Wendee Copp, t9 Latest Units: kU/L  <0.10  Allergen, Cottonwood, t14 Latest Units: kU/L  <0.10  Elm IgE Latest Units: kU/L  <0.10  Allergen, Mulberry, t76 Latest Units: kU/L  <0.10  Allergen, Oak,t7 Latest Units: kU/L  <0.10  COMMON RAGWEED (SHORT) (W1) IGE Latest Units: kU/L  <0.10  Allergen, Mouse Urine Protein, e78 Latest Units: kU/L  <0.10  D. farinae Latest Units: kU/L  <0.10  Allergen, Cedar tree, t12 Latest Units: kU/L  <0.10  Box Elder IgE Latest Units: kU/L  <0.10  Rough Pigweed  IgE Latest Units: kU/L  <0.10  Anti Nuclear Antibody (ANA) Latest Ref Range: Negative  Negative   RA Latex Turbid. Latest Ref Range: 0.0 - 13.9 IU/mL 9.3    ECHO march 2021  IMPRESSIONS    1. Left ventricular ejection fraction, by estimation, is 60 to 65%. The  left ventricle has normal function. The left ventricle has no regional  wall motion abnormalities. Left ventricular diastolic parameters were  normal.  2. Right ventricular systolic function is normal. The right ventricular  size is normal.  3. The mitral valve is normal in structure. No evidence of mitral valve  regurgitation. No evidence of mitral stenosis.  4. The aortic valve is tricuspid. Aortic valve regurgitation is not  visualized. No aortic stenosis is present.  5. The inferior vena cava is normal in size with greater than 50%  respiratory variability, suggesting right atrial pressure of 3 mmHg.   Comparison(s): No prior Echocardiogram. Stress echo 04/04/17 EF 55%.   Conclusion(s)/Recommendation(s): Normal biventricular function without  evidence of  hemodynamically significant valvular heart disease.      10/2019 CT chest high resolution  IMPRESSION: 1. No evidence of interstitial lung disease. 2. Nonspecific mild diffuse bronchial wall thickening, as can be seen with reactive airways disease or chronic bronchitis. 3. Small hiatal hernia. 4. Cholelithiasis.    CBC Latest Ref Rng & Units 06/02/2020 06/01/2020 05/31/2020  WBC 4.0 - 10.5 K/uL 8.9 8.5 7.4  Hemoglobin 12.0 - 15.0 g/dL 11.5(L) 11.7(L) 11.0(L)  Hematocrit 36 - 46 % 35.0(L) 35.3(L) 33.9(L)  Platelets 150 - 400 K/uL 367 404(H) 339    BMP Latest Ref Rng & Units 06/05/2020 06/04/2020 06/03/2020  Glucose 70 - 99 mg/dL 102(H) 100(H) 102(H)  BUN 6 - 20 mg/dL 21(H) 19 21(H)  Creatinine 0.44 - 1.00 mg/dL 0.78 0.79 0.70  BUN/Creat Ratio 9 - 23 - - -  Sodium 135 - 145 mmol/L 141 141 139  Potassium 3.5 - 5.1 mmol/L 3.8 3.5 3.1(L)  Chloride 98 - 111 mmol/L 103 105 104  CO2 22 - 32 mmol/L 27 27 27   Calcium 8.9 - 10.3 mg/dL 8.7(L) 8.5(L) 8.5(L)    BNP No results found for: BNP  ProBNP No results found for: PROBNP  PFT No results found for: FEV1PRE, FEV1POST, FVCPRE, FVCPOST, TLC, DLCOUNC, PREFEV1FVCRT, PSTFEV1FVCRT  No results found.   Past medical hx Past Medical History:  Diagnosis Date  . Asthma   . Asthma   . Chest pain    LHC 7/08: EF 60%, normal coronaries; ETT-Echo 6/11: EF 55-60%, no ischemia  . Chronic back pain   . CTS (carpal tunnel syndrome)    bil  . CTS (carpal tunnel syndrome)   . Depression 05/28/2020  . Hypothyroidism   . IBS (irritable bowel syndrome)   . Internal hemorrhoids   . Migraines   . OSA on CPAP 11/13/2017   Mild with AHI 13/hr   . Pulmonary nodule, right      Social History   Tobacco Use  . Smoking status: Never Smoker  . Smokeless tobacco: Never Used  Vaping Use  . Vaping Use: Never used  Substance Use Topics  . Alcohol use: No  . Drug use: No    Ms.Ritthaler reports that she has never smoked. She has never used  smokeless tobacco. She reports that she does not drink alcohol and does not use drugs.  Tobacco Cessation: Never smoker  Past surgical hx, Family hx, Social hx all reviewed.  Current Outpatient Medications on File Prior to Visit  Medication Sig  . albuterol (VENTOLIN HFA) 108 (90 Base) MCG/ACT inhaler Inhale 2 puffs into the lungs every 6 (six) hours.  . ALPRAZolam (XANAX) 0.5 MG tablet Take 0.25 mg by mouth as directed. PRN  . aspirin EC 81 MG tablet Take 1 tablet (81 mg total) by mouth daily.  Marland Kitchen BIOTIN PO Take 1 tablet by mouth as directed.  . budesonide-formoterol (SYMBICORT) 80-4.5 MCG/ACT inhaler Inhale 1 puff into the lungs 2 (two) times daily as needed (asthma).   . CALCIUM PO Take 1 tablet by mouth as directed.  . Cholecalciferol (VITAMIN D3 PO) Take 25,000 Units by mouth once a week.  . citalopram (CELEXA) 20 MG tablet Take 20 mg by mouth daily.   . cyclobenzaprine (FLEXERIL) 10 MG tablet TAKE 1 TABLET THREE TIMES A DAY AS NEEDED FOR MUSCLE SPASMS (Patient taking differently: Take 10 mg by mouth at bedtime. )  . DONNATAL 16.2 MG tablet Take 16.2 mg by mouth daily as needed (Stomach pain).   Marland Kitchen EPINEPHrine 0.3 mg/0.3 mL IJ SOAJ injection Inject 0.3 mg into the muscle as needed for anaphylaxis.   Marland Kitchen guaiFENesin-dextromethorphan (ROBITUSSIN DM) 100-10 MG/5ML syrup Take 5 mLs by mouth every 6 (six) hours as needed for cough.  Marland Kitchen HYDROcodone-acetaminophen (NORCO/VICODIN) 5-325 MG tablet Take 1 tablet by mouth every 6 (six) hours as needed for moderate pain.  Marland Kitchen ibuprofen (ADVIL,MOTRIN) 800 MG tablet Take 800 mg by mouth every 8 (eight) hours as needed (pain).  . montelukast (SINGULAIR) 10 MG tablet Take 10 mg by mouth at bedtime.  . ondansetron (ZOFRAN) 4 MG tablet Take 1 tablet (4 mg total) by mouth every 6 (six) hours as needed for nausea.  . polyethylene glycol powder (GLYCOLAX/MIRALAX) powder Take 17 g by mouth as needed for mild constipation.   . Prenat-FeFum-Doc-FA-DHA w/o A (NEXA  PLUS) 29-1.25-350 MG CAPS Take 1 tablet by mouth Daily.  Marland Kitchen SYNTHROID 75 MCG tablet Take 75 mcg by mouth daily.  Marland Kitchen  topiramate (TOPAMAX) 50 MG tablet Take 50-100 mg by mouth See admin instructions. 23m in the morning and 1062mby mouth at night  . Vitamin D, Ergocalciferol, (DRISDOL) 1.25 MG (50000 UNIT) CAPS capsule Take 50,000 Units by mouth once a week.  . zinc gluconate 50 MG tablet Take 50 mg by mouth daily.  . Marland Kitchenolpidem (AMBIEN) 10 MG tablet Take 0.5 tablets by mouth at bedtime as needed for sleep.   . Marland Kitchensomeprazole (NEXIUM) 40 MG capsule Take 40 mg by mouth 2 (two) times daily before a meal.   . nitroGLYCERIN (NITROSTAT) 0.4 MG SL tablet Place 1 tablet (0.4 mg total) under the tongue every 5 (five) minutes as needed for chest pain.   No current facility-administered medications on file prior to visit.     Allergies  Allergen Reactions  . Gadolinium Derivatives Other (See Comments)    Severe pain, joints locked up  . Isosorbide Nitrate Anaphylaxis  . Latex Other (See Comments) and Shortness Of Breath    Asthma attack   . Other Shortness Of Breath and Other (See Comments)    Severe pain, joints locked up gadolinium contrast-causes joints to lock up, severe pain Severe pain, joints locked up gadolinium contrast-causes joints to lock up, severe pain  Asthma attack  . Fluticasone-Salmeterol Other (See Comments)    Blurry vision Blurry vision Blurry vision Blurry vision Blurry vision  . Contrast Media [Iodinated Diagnostic Agents]     gadolinium contrast-causes joints to lock up, severe pain  . Doxycycline Itching and Rash  . Sulfamethoxazole-Trimethoprim Diarrhea and Nausea And Vomiting    REACTION: GI upset Other reaction(s): OTHER REACTION: GI upset Other reaction(s): OTHER Other reaction(s): OTHER    Review Of Systems:  Constitutional:   No  weight loss, night sweats,  Fevers, chills, + fatigue, or  lassitude.  HEENT:   No headaches,  Difficulty swallowing,   Tooth/dental problems, or  Sore throat,                No sneezing, itching, ear ache, nasal congestion, post nasal drip,   CV:  No chest pain,  Orthopnea, PND, swelling in lower extremities, anasarca, dizziness, palpitations, syncope.   GI  No heartburn, indigestion, abdominal pain, nausea, vomiting, diarrhea, change in bowel habits, loss of appetite, bloody stools.   Resp: + shortness of breath with exertion or at rest.  No excess mucus, no productive cough,  No non-productive cough,  No coughing up of blood.  No change in color of mucus.  No wheezing.  No chest wall deformity  Skin: no rash or lesions.  GU: no dysuria, change in color of urine, no urgency or frequency.  No flank pain, no hematuria   MS:  No joint pain or swelling.  No decreased range of motion.  No back pain.  Psych:  No change in mood or affect. No depression or anxiety.  No memory loss.   Vital Signs BP 110/70 (BP Location: Left Arm, Cuff Size: Normal)   Pulse 87   Temp (!) 97.3 F (36.3 C) (Oral)   Ht 5' (1.524 m)   Wt 156 lb 9.6 oz (71 kg)   SpO2 99%   BMI 30.58 kg/m    Physical Exam:  General- No distress,  A&Ox3 on RA ENT: No sinus tenderness, TM clear, pale nasal mucosa, no oral exudate,no post nasal drip, no LAN Cardiac: S1, S2, regular rate and rhythm, no murmur Chest: No wheeze/ rales/ dullness; no accessory muscle use, no nasal flaring, no  sternal retractions Abd.: Soft Non-tender, ND, BS +, Body mass index is 30.58 kg/m. Ext: No clubbing cyanosis, edema Neuro:  normal strength, MAE x 4, A&O x 3 Skin: No rashes, No lesions, warm and dry Psych: normal mood and behavior   Assessment/Plan  Post Covid 19 Dyspnea CXR 9/13 with worsening infiltrates Plan Remember to use your oxygen at 2L [place]. Saturation Goal is > 88% Remember to use at bedtime with you CPAP machine Continue  Symbicort as you have been doing. Continue your neb treatments 4 times daily, as you have been doing We will  prescribe a prednisone taper  Prednisone taper; 10 mg tablets: 4 tabs x 2 days, 3 tabs x 2 days, 2 tabs x 2 days 1 tab x 2 days then stop. We will get a CT Chest without contrast. We will call you with the results Follow up in 2 weeks with Dr. Chase Caller or Judson Roch NP to see you are doing.  Please contact office for sooner follow up if symptoms do not improve or worsen or seek emergency care   Asthma Plan Continue  Symbicort as you have been doing. Continue your neb treatments 4 times daily, as you have been doing Prednisone taper; 10 mg tablets: 4 tabs x 2 days, 3 tabs x 2 days, 2 tabs x 2 days 1 tab x 2 days then stop. Will need repeat PFT's after CT Chest Please contact office for sooner follow up if symptoms do not improve or worsen or seek emergency care    Magdalen Spatz, NP 07/21/2020  2:22 PM

## 2020-08-01 ENCOUNTER — Ambulatory Visit (HOSPITAL_COMMUNITY)
Admission: RE | Admit: 2020-08-01 | Discharge: 2020-08-01 | Disposition: A | Discharge status: Home / Self Care | Payer: BC Managed Care – PPO | Source: Ambulatory Visit | Attending: Acute Care | Admitting: Acute Care

## 2020-08-01 ENCOUNTER — Other Ambulatory Visit: Payer: Self-pay

## 2020-08-01 DIAGNOSIS — R9389 Abnormal findings on diagnostic imaging of other specified body structures: Secondary | ICD-10-CM | POA: Insufficient documentation

## 2020-08-04 ENCOUNTER — Other Ambulatory Visit: Payer: Self-pay

## 2020-08-04 ENCOUNTER — Encounter: Payer: Self-pay | Admitting: Acute Care

## 2020-08-04 ENCOUNTER — Ambulatory Visit (INDEPENDENT_AMBULATORY_CARE_PROVIDER_SITE_OTHER): Payer: BC Managed Care – PPO | Admitting: Acute Care

## 2020-08-04 VITALS — BP 118/60 | HR 87 | Temp 98.7°F | Ht 60.0 in | Wt 157.0 lb

## 2020-08-04 DIAGNOSIS — G4733 Obstructive sleep apnea (adult) (pediatric): Secondary | ICD-10-CM | POA: Diagnosis not present

## 2020-08-04 DIAGNOSIS — R053 Chronic cough: Secondary | ICD-10-CM

## 2020-08-04 DIAGNOSIS — Z0000 Encounter for general adult medical examination without abnormal findings: Secondary | ICD-10-CM

## 2020-08-04 DIAGNOSIS — R0602 Shortness of breath: Secondary | ICD-10-CM

## 2020-08-04 DIAGNOSIS — J45909 Unspecified asthma, uncomplicated: Secondary | ICD-10-CM | POA: Diagnosis not present

## 2020-08-04 DIAGNOSIS — Z9989 Dependence on other enabling machines and devices: Secondary | ICD-10-CM

## 2020-08-04 DIAGNOSIS — J84112 Idiopathic pulmonary fibrosis: Secondary | ICD-10-CM

## 2020-08-04 NOTE — Progress Notes (Addendum)
History of Present Illness Catherine Hancock is a 59 y.o. female never smoker with asthma , OSA on CPAP,  right pulmonary nodule,  Chronic cough and dyspnea. She had Covid 19 05/2020. She is continuing to have dyspnea post Covid. She is followed by Dr. 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 Self-referred for shortness of breath and cough. She has had a lifelong history of asthma for which she is on Symbicort and Singulair on a scheduled basis. Then in 2018 Easter she had influenza treated with Tamiflu as an outpatient in [facility] after family cluster. Since then she has had cough that has persisted all along. The cough is mild and persistent. Never really improved. Then in early 2020 she developed insidious onset of shortness of breath that has progressed. She states she can get short of breath anytime. At night she can wake up occasionally because of the cough but never because of shortness of breath. There is no orthopnea proximal nocturnal dyspnea but in the daytime she perceives dyspnea for anything and randomly. She says that when she walks  the shortness of breath is worse and relieved by rest . There is no associated chest pain. Pt. Had Covid 05/2020, she chose not to vaccinate, and has had worsening of her exertional dyspnea. CT Chest shows mild mild post infectious/inflammatory scarring/fibrosis in the setting of COVID.    08/04/2020  Pt. Presents for follow up after prednisone taper and to review CT Chest . Pt. States she does well when she is on prednisone, and then she has had worsening shortness of breath when she is off her prednisone. She states he does have some occasional wheezing. She is in no distress in the office . Sats are 98% after walking into the exam room. Pt. Is using her Symbicort and her albuterol as rescue. She is using her nebs about 3-4 times daily.  Pt. Is also on CPAP, and she has been wearing this every night. She states she has had no issues with this. She does have oxygen at home, and she does use this when her oxygen sats drop below 88%. She denies any fever, chest pain , orthopnea of hemoptysis.  We discussed the results of her CT scan, and that she has some scarring / fibrosis post Covid that is most likely contributing to her dyspnea.    Pt. States she is using her CPAP daily. She is having no issues.  Test Results: CT Chest 08/01/2020 Lungs/Pleura: Scattered ground-glass opacity/scarring with mild subpleural reticulation in the lungs bilaterally, lower lobe predominant, likely reflecting post infectious/inflammatory scarring/fibrosis in the setting of COVID   Mild post infectious/inflammatory scarring/fibrosis in the setting of COVID.  No evidence of acute cardiopulmonary disease.  01/2020: Cardiopulmonary Stress Test ( Pre Covid) Exercise testing with gas exchange demonstrates normal functional capacity when compared to matched sedentary norms. There is no indication for cardiopulmonary limitation. 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   CBC Latest Ref Rng & Units 06/02/2020 06/01/2020 05/31/2020  WBC 4.0 - 10.5 K/uL 8.9 8.5 7.4  Hemoglobin 12.0 - 15.0 g/dL 11.5(L) 11.7(L) 11.0(L)  Hematocrit 36 - 46 % 35.0(L) 35.3(L) 33.9(L)  Platelets 150 - 400 K/uL 367 404(H) 339    BMP Latest Ref Rng & Units 06/05/2020 06/04/2020 06/03/2020  Glucose 70 - 99 mg/dL  102(H) 100(H) 102(H)  BUN 6 - 20 mg/dL 21(H) 19 21(H)  Creatinine 0.44 - 1.00 mg/dL 0.78 0.79 0.70  BUN/Creat Ratio 9 - 23 - - -  Sodium 135 - 145 mmol/L 141 141 139  Potassium 3.5 - 5.1 mmol/L 3.8 3.5 3.1(L)  Chloride 98 - 111 mmol/L 103 105 104  CO2 22 - 32 mmol/L 27 27 27   Calcium 8.9 - 10.3 mg/dL 8.7(L) 8.5(L) 8.5(L)    BNP No results found for:  BNP  ProBNP No results found for: PROBNP  PFT No results found for: FEV1PRE, FEV1POST, FVCPRE, FVCPOST, TLC, DLCOUNC, PREFEV1FVCRT, PSTFEV1FVCRT  CT CHEST WO CONTRAST  Result Date: 08/02/2020 CLINICAL DATA:  Follow-up COVID pneumonia EXAM: CT CHEST WITHOUT CONTRAST TECHNIQUE: Multidetector CT imaging of the chest was performed following the standard protocol without IV contrast. COMPARISON:  Chest radiograph dated 06/04/2020 FINDINGS: Cardiovascular: The heart is normal in size. No pericardial effusion. No evidence of thoracic aortic aneurysm. Mediastinum/Nodes: No suspicious mediastinal lymphadenopathy. Visualized thyroid is unremarkable. Lungs/Pleura: Scattered ground-glass opacity/scarring with mild subpleural reticulation in the lungs bilaterally, lower lobe predominant, likely reflecting post infectious/inflammatory scarring/fibrosis in the setting of COVID. No focal consolidation. No suspicious pulmonary nodules. No pleural effusion or pneumothorax. Upper Abdomen: Visualized upper abdomen is grossly unremarkable. Musculoskeletal: Visualized osseous structures are within normal limits. IMPRESSION: Mild post infectious/inflammatory scarring/fibrosis in the setting of COVID. No evidence of acute cardiopulmonary disease. Electronically Signed   By: Julian Hy M.D.   On: 08/02/2020 10:06     Past medical hx Past Medical History:  Diagnosis Date   Asthma    Asthma    Chest pain    LHC 7/08: EF 60%, normal coronaries; ETT-Echo 6/11: EF 55-60%, no ischemia   Chronic back pain    CTS (carpal tunnel syndrome)    bil   CTS (carpal tunnel syndrome)    Depression 05/28/2020   Hypothyroidism    IBS (irritable bowel syndrome)    Internal hemorrhoids    Migraines    OSA on CPAP 11/13/2017   Mild with AHI 13/hr    Pulmonary nodule, right      Social History   Tobacco Use   Smoking status: Never Smoker   Smokeless tobacco: Never Used  Vaping Use   Vaping Use: Never  used  Substance Use Topics   Alcohol use: No   Drug use: No    Ms.Magee reports that she has never smoked. She has never used smokeless tobacco. She reports that she does not drink alcohol and does not use drugs.  Tobacco Cessation: Never smoker but second hand smoke exposure  Past surgical hx, Family hx, Social hx all reviewed.  Current Outpatient Medications on File Prior to Visit  Medication Sig   albuterol (ACCUNEB) 0.63 MG/3ML nebulizer solution Take by nebulization 3 (three) times daily.   albuterol (VENTOLIN HFA) 108 (90 Base) MCG/ACT inhaler Inhale 2 puffs into the lungs every 6 (six) hours.   ALPRAZolam (XANAX) 0.5 MG tablet Take 0.25 mg by mouth as directed. PRN   aspirin EC 81 MG tablet Take 1 tablet (81 mg total) by mouth daily.   BIOTIN PO Take 1 tablet by mouth as directed.   budesonide-formoterol (SYMBICORT) 80-4.5 MCG/ACT inhaler Inhale 1 puff into the lungs 2 (two) times daily as needed (asthma).    CALCIUM PO Take 1 tablet by mouth as directed.   Cholecalciferol (VITAMIN D3 PO) Take 25,000 Units by mouth once a week.   citalopram (CELEXA) 20 MG tablet  Take 20 mg by mouth daily.    cyclobenzaprine (FLEXERIL) 10 MG tablet TAKE 1 TABLET THREE TIMES A DAY AS NEEDED FOR MUSCLE SPASMS (Patient taking differently: Take 10 mg by mouth at bedtime. )   DONNATAL 16.2 MG tablet Take 16.2 mg by mouth daily as needed (Stomach pain).    EPINEPHrine 0.3 mg/0.3 mL IJ SOAJ injection Inject 0.3 mg into the muscle as needed for anaphylaxis.    esomeprazole (NEXIUM) 40 MG capsule Take 40 mg by mouth 2 (two) times daily before a meal.    guaiFENesin-dextromethorphan (ROBITUSSIN DM) 100-10 MG/5ML syrup Take 5 mLs by mouth every 6 (six) hours as needed for cough.   HYDROcodone-acetaminophen (NORCO/VICODIN) 5-325 MG tablet Take 1 tablet by mouth every 6 (six) hours as needed for moderate pain.   ibuprofen (ADVIL,MOTRIN) 800 MG tablet Take 800 mg by mouth every 8 (eight)  hours as needed (pain).   montelukast (SINGULAIR) 10 MG tablet Take 10 mg by mouth at bedtime.   nitroGLYCERIN (NITROSTAT) 0.4 MG SL tablet Place 1 tablet (0.4 mg total) under the tongue every 5 (five) minutes as needed for chest pain.   ondansetron (ZOFRAN) 4 MG tablet Take 1 tablet (4 mg total) by mouth every 6 (six) hours as needed for nausea.   polyethylene glycol powder (GLYCOLAX/MIRALAX) powder Take 17 g by mouth as needed for mild constipation.    Prenat-FeFum-Doc-FA-DHA w/o A (NEXA PLUS) 29-1.25-350 MG CAPS Take 1 tablet by mouth Daily.   SYNTHROID 75 MCG tablet Take 50 mcg by mouth daily.    topiramate (TOPAMAX) 50 MG tablet Take 50-100 mg by mouth See admin instructions. 50mg  in the morning and 100mg  by mouth at night   Vitamin D, Ergocalciferol, (DRISDOL) 1.25 MG (50000 UNIT) CAPS capsule Take 50,000 Units by mouth once a week.   zinc gluconate 50 MG tablet Take 50 mg by mouth daily.   zolpidem (AMBIEN) 10 MG tablet Take 0.5 tablets by mouth at bedtime as needed for sleep.    predniSONE (DELTASONE) 10 MG tablet Take 4 tabs x 2 days, then 3 x 2d, then 2 x 2d, then 1 x 2d and STOP (Patient not taking: Reported on 08/04/2020)   No current facility-administered medications on file prior to visit.     Allergies  Allergen Reactions   Gadolinium Derivatives Other (See Comments)    Severe pain, joints locked up   Isosorbide Nitrate Anaphylaxis   Latex Other (See Comments) and Shortness Of Breath    Asthma attack    Other Shortness Of Breath and Other (See Comments)    Severe pain, joints locked up gadolinium contrast-causes joints to lock up, severe pain Severe pain, joints locked up gadolinium contrast-causes joints to lock up, severe pain  Asthma attack   Fluticasone-Salmeterol Other (See Comments)    Blurry vision Blurry vision Blurry vision Blurry vision Blurry vision   Contrast Media [Iodinated Diagnostic Agents]     gadolinium contrast-causes joints to  lock up, severe pain   Doxycycline Itching and Rash   Sulfamethoxazole-Trimethoprim Diarrhea and Nausea And Vomiting    REACTION: GI upset Other reaction(s): OTHER REACTION: GI upset Other reaction(s): OTHER Other reaction(s): OTHER    Review Of Systems:  Constitutional:   No  weight loss, night sweats,  Fevers, chills, fatigue, or  lassitude.  HEENT:   No headaches,  Difficulty swallowing,  Tooth/dental problems, or  Sore throat,                No sneezing,  itching, ear ache, nasal congestion, post nasal drip,   CV:  No chest pain,  Orthopnea, PND, swelling in lower extremities, anasarca, dizziness, palpitations, syncope.   GI  No heartburn, indigestion, abdominal pain, nausea, vomiting, diarrhea, change in bowel habits, loss of appetite, bloody stools.   Resp: + shortness of breath with exertion less at rest.  No excess mucus, no productive cough,  + chronic non-productive cough,  No coughing up of blood.  No change in color of mucus.  Occasional  wheezing.  No chest wall deformity  Skin: no rash or lesions.  GU: no dysuria, change in color of urine, no urgency or frequency.  No flank pain, no hematuria   MS:  No joint pain or swelling.  No decreased range of motion.  No back pain.  Psych:  No change in mood or affect. No depression or anxiety.  No memory loss.   Vital Signs BP 118/60    Pulse 87    Temp 98.7 F (37.1 C) (Oral)    Ht 5' (1.524 m)    Wt 157 lb (71.2 kg)    SpO2 98%    BMI 30.66 kg/m    Physical Exam:  General- No distress, speaking in full sentences,pleasant ENT: No sinus tenderness, TM clear, pale nasal mucosa, no oral exudate,no post nasal drip, no LAN Cardiac: S1, S2, regular rate and rhythm, no murmur Chest: No wheeze/ rales/ dullness; no accessory muscle use, no nasal flaring, no sternal retractions, diminished per bases, prolonged  Expirations, speaking in full sentences  Abd.: Soft Non-tender, ND, BS +, Body mass index is 30.66 kg/m. Ext: No  clubbing cyanosis, edema, no obvious deformities Neuro:  normal strength, MAE x 4, A&O x 3, appropriate Skin: No rashes, No lesions, warm and dry Psych: normal mood and behavior   Assessment/Plan  Exertional dyspnea CT Chest with Mild post infectious/inflammatory scarring/fibrosis in the setting of COVID. Plan We will schedule a 6 minute walk We will schedule PFT's You will need to get Covid testing prior We will refer you to Pulmonary rehab Continue using your home oxygen for sats of less than 88%. Continue to use Covid Precautions, wear a face cover and avoid crowds.  OSA on CPAP  Down Load not reviewed at this appointment Plan Continue on CPAP at bedtime. You appear to be benefiting from the treatment  Goal is to wear for at least 6 hours each night for maximal clinical benefit. Continue to work on weight loss, as the link between excess weight  and sleep apnea is well established.   Remember to establish a good bedtime routine, and work on sleep hygiene.  Limit daytime naps , avoid stimulants such as caffeine and nicotine close to bedtime, exercise daily to promote sleep quality, avoid heavy , spicy, fried , or rich foods before bed. Ensure adequate exposure to natural light during the day,establish a relaxing bedtime routine with a pleasant sleep environment ( Bedroom between 60 and 67 degrees, turn off bright lights , TV or device screens screens , consider black out curtains or white noise machines) Do not drive if sleepy. Remember to clean mask, tubing, filter, and reservoir once weekly with soapy water.  Follow up in 2 months with Dr. Vaughan Browner or Dr. Chase Caller Please contact office for sooner follow up if symptoms do not improve or worsen or seek emergency care   Asthma Plan Continue Symbicort and Singulair Continue rescue inhaler as needed for breakthrough shortness of breath Consider Labs to evaluate for  biologics  Consider addition of Singulair Counseled on trigger  avoidance   Healthcare Maintenance Plan Needs influenza vaccine, deferred today Will need Covid vaccine 3 -4 months after diagnosis  This appointment was 30 min long with over 50% of the time in direct face-to-face patient care, assessment, plan of care, and follow-up.   Magdalen Spatz, NP 08/04/2020  10:34 AM

## 2020-08-04 NOTE — Patient Instructions (Addendum)
It is good to see you today.  Continue using your Symbicort daily. Rinse mouth after use Continue using your rescue nebs as needed  We will schedule PFT's You will need to get a Covid Test first We will schedule a 6 minute walk. We will refer you to Pulmonary Rehab for your post Covid Fibrosis. Continue using your home oxygen for oxygen saturations of less than 88%. Continue to use Covid Precautions, wear a face cover and avoid crowds.  Continue on CPAP at bedtime. You appear to be benefiting from the treatment  Goal is to wear for at least 6 hours each night for maximal clinical benefit. Continue to work on weight loss, as the link between excess weight  and sleep apnea is well established.   Remember to establish a good bedtime routine, and work on sleep hygiene.  Limit daytime naps , avoid stimulants such as caffeine and nicotine close to bedtime, exercise daily to promote sleep quality, avoid heavy , spicy, fried , or rich foods before bed. Ensure adequate exposure to natural light during the day,establish a relaxing bedtime routine with a pleasant sleep environment ( Bedroom between 60 and 67 degrees, turn off bright lights , TV or device screens screens , consider black out curtains or white noise machines) Do not drive if sleepy. Remember to clean mask, tubing, filter, and reservoir once weekly with soapy water.  Follow up in 2 months with Dr. Vaughan Browner or Dr. Chase Caller Please contact office for sooner follow up if symptoms do not improve or worsen or seek emergency care

## 2020-08-07 ENCOUNTER — Telehealth: Payer: Self-pay | Admitting: Cardiology

## 2020-08-07 NOTE — Progress Notes (Signed)
Reviewed with patient at [place] 10/4 Follow up plan with Dr. Chase Caller or Dr. Vaughan Browner

## 2020-08-07 NOTE — Telephone Encounter (Signed)
Patient wanted to know if Carly would help her get a mask for her CPAP that she can use her Oxgen at the same time. She had COVID and will need to be on Oxygen for an undetermined amount of time, but the current mask she uses she can not use her oxygen. Please assist

## 2020-08-11 NOTE — Telephone Encounter (Signed)
Patient went to her dme and they were ale to help her.

## 2020-09-16 ENCOUNTER — Other Ambulatory Visit (HOSPITAL_COMMUNITY)
Admission: RE | Admit: 2020-09-16 | Discharge: 2020-09-16 | Disposition: A | Discharge status: Home / Self Care | Payer: BC Managed Care – PPO | Source: Ambulatory Visit | Attending: Pulmonary Disease | Admitting: Pulmonary Disease

## 2020-09-16 DIAGNOSIS — Z01812 Encounter for preprocedural laboratory examination: Secondary | ICD-10-CM | POA: Insufficient documentation

## 2020-09-16 DIAGNOSIS — Z20822 Contact with and (suspected) exposure to covid-19: Secondary | ICD-10-CM | POA: Insufficient documentation

## 2020-09-16 LAB — SARS CORONAVIRUS 2 (TAT 6-24 HRS): SARS Coronavirus 2: NEGATIVE

## 2020-09-19 ENCOUNTER — Encounter: Payer: Self-pay | Admitting: Pulmonary Disease

## 2020-09-19 ENCOUNTER — Ambulatory Visit (INDEPENDENT_AMBULATORY_CARE_PROVIDER_SITE_OTHER): Payer: BC Managed Care – PPO | Admitting: Internal Medicine

## 2020-09-19 ENCOUNTER — Other Ambulatory Visit: Payer: Self-pay | Admitting: Pulmonary Disease

## 2020-09-19 ENCOUNTER — Ambulatory Visit (INDEPENDENT_AMBULATORY_CARE_PROVIDER_SITE_OTHER): Payer: BC Managed Care – PPO

## 2020-09-19 ENCOUNTER — Other Ambulatory Visit: Payer: Self-pay

## 2020-09-19 ENCOUNTER — Ambulatory Visit (INDEPENDENT_AMBULATORY_CARE_PROVIDER_SITE_OTHER): Payer: BC Managed Care – PPO | Admitting: Pulmonary Disease

## 2020-09-19 VITALS — BP 122/76 | HR 98 | Temp 98.7°F | Ht 60.0 in | Wt 154.0 lb

## 2020-09-19 DIAGNOSIS — J4540 Moderate persistent asthma, uncomplicated: Secondary | ICD-10-CM

## 2020-09-19 DIAGNOSIS — R0602 Shortness of breath: Secondary | ICD-10-CM

## 2020-09-19 DIAGNOSIS — U071 COVID-19: Secondary | ICD-10-CM | POA: Diagnosis not present

## 2020-09-19 DIAGNOSIS — J1282 Pneumonia due to coronavirus disease 2019: Secondary | ICD-10-CM

## 2020-09-19 DIAGNOSIS — Z8616 Personal history of COVID-19: Secondary | ICD-10-CM | POA: Insufficient documentation

## 2020-09-19 DIAGNOSIS — Z9989 Dependence on other enabling machines and devices: Secondary | ICD-10-CM

## 2020-09-19 DIAGNOSIS — R079 Chest pain, unspecified: Secondary | ICD-10-CM

## 2020-09-19 DIAGNOSIS — Z0000 Encounter for general adult medical examination without abnormal findings: Secondary | ICD-10-CM

## 2020-09-19 DIAGNOSIS — G4733 Obstructive sleep apnea (adult) (pediatric): Secondary | ICD-10-CM

## 2020-09-19 LAB — PULMONARY FUNCTION TEST
DL/VA % pred: 83 %
DL/VA: 3.65 ml/min/mmHg/L
DLCO cor % pred: 79 %
DLCO cor: 14.02 ml/min/mmHg
DLCO unc % pred: 79 %
DLCO unc: 14.02 ml/min/mmHg
FEF 25-75 Post: 2.72 L/sec
FEF 25-75 Pre: 2.32 L/sec
FEF2575-%Change-Post: 17 %
FEF2575-%Pred-Post: 125 %
FEF2575-%Pred-Pre: 106 %
FEV1-%Change-Post: 2 %
FEV1-%Pred-Post: 99 %
FEV1-%Pred-Pre: 97 %
FEV1-Post: 2.21 L
FEV1-Pre: 2.16 L
FEV1FVC-%Change-Post: 5 %
FEV1FVC-%Pred-Pre: 104 %
FEV6-%Change-Post: -3 %
FEV6-%Pred-Post: 92 %
FEV6-%Pred-Pre: 95 %
FEV6-Post: 2.56 L
FEV6-Pre: 2.64 L
FEV6FVC-%Pred-Post: 103 %
FEV6FVC-%Pred-Pre: 103 %
FVC-%Change-Post: -3 %
FVC-%Pred-Post: 89 %
FVC-%Pred-Pre: 92 %
FVC-Post: 2.56 L
FVC-Pre: 2.64 L
Post FEV1/FVC ratio: 87 %
Post FEV6/FVC ratio: 100 %
Pre FEV1/FVC ratio: 82 %
Pre FEV6/FVC Ratio: 100 %
RV % pred: 36 %
RV: 0.64 L
TLC % pred: 73 %
TLC: 3.28 L

## 2020-09-19 MED ORDER — BUDESONIDE-FORMOTEROL FUMARATE 160-4.5 MCG/ACT IN AERO: 2.0000 | INHALATION_SPRAY | Freq: Every day | RESPIRATORY_TRACT | 12 refills | Status: DC

## 2020-09-19 MED ORDER — BUDESONIDE-FORMOTEROL FUMARATE 160-4.5 MCG/ACT IN AERO: 2.0000 | INHALATION_SPRAY | Freq: Two times a day (BID) | RESPIRATORY_TRACT | 6 refills | Status: DC

## 2020-09-19 NOTE — Assessment & Plan Note (Signed)
Potential cough variant asthma Contracted COVID-19 required hospitalization in July/2021 Remains unvaccinated Reports adherence to Symbicort 80 Still requires Laurence Ferrari use 4 times a day  Plan: We will increase Symbicort 80 to Symbicort 160 We will schedule 2 month follow-up with Dr. Chase Caller Chest x-ray today Encouraged patient to reconsider stance on COVID-19 vaccination Walk today in office Continue Singulair Start daily antihistamine

## 2020-09-19 NOTE — Assessment & Plan Note (Signed)
Plan: Recommend COVID-19 vaccinations, patient declines Patient is aware that she is high risk for complications should she contract COVID-19 again

## 2020-09-19 NOTE — Patient Instructions (Signed)
Full PFT performed today; results will be given at office visit.

## 2020-09-19 NOTE — Progress Notes (Signed)
Full PFT performed today. °

## 2020-09-19 NOTE — Assessment & Plan Note (Signed)
Reviewed pulmonary function testing the patient Showing mild restriction  Plan: Walk today in office We will change Symbicort 80 to Symbicort 160 Chest x-ray today Agree with upcoming cardiology eval as scheduled by the patient

## 2020-09-19 NOTE — Progress Notes (Signed)
@Patient  ID: Catherine Hancock, female    DOB: August 29, 1961, 59 y.o.   MRN: [id number]  Chief Complaint  Patient presents with  . Follow-up    go over results of pft    Referring provider: Tobe Sos, MD  HPI:  59 year old female never smoker followed in our office for asthma, obstructive sleep apnea, history of COVID-19  PMH: History of chest pain, IBS, carpal tunnel, chronic back pain, GERD, depression Smoker/ Smoking History: Never smoker Maintenance: Symbicort 160 Pt of: Dr. Chase Caller  09/19/2020  - Visit   59 year old female never smoker followed in our office for dyspnea on exertion, asthma, history of COVID-19 infection.  She is followed by Dr. Chase Caller.  She is presenting today as a follow-up visit after completing pulmonary function testing.  She was last seen in our office in October/2021.  She was seen by SG NP.  She was encouraged to remain on Symbicort at that time, continue Singulair, continue CPAP therapy.  Patient presenting to office today after completing pulmonary function testing those results are listed below:  09/19/2020-pulmonary function test-FVC 2.64 (92% addicted), TLC 3.28 (73% percent predicted), postbronchodilator ratio 87, postbronchodilator FEV1 2.21 (99% predicted), DLCO 14.02 (79% addicted)  At last evaluation by Dr. Chase Caller she was referred to ENT for chronic cough irritable larynx syndrome as well as for a cardiopulmonary exercise test back in March/2021.  Patient contracted [id number]-51 in July/2021.  He did require hospitalization.  She was unvaccinated COVID-19.  She did not receive the monoclonal antibody infusion.  She did not require mechanical ventilator.  She was treated with remdesivir as well as steroids.  She feels that her breathing symptoms have been persistent since then.  A follow-up CT did show COVID-19 pneumonia inflammatory changes.  Patient reports adherence to Symbicort 80.  She also reports the need to use her rescue inhaler 4  times a day.  She denies that this is for shortness of breath or cough or wheezing.  She reports that it is a sensation in her chest and the albuterol treats this.  We will discuss this today.  She also occasionally uses her albuterol nebulized medications.  Patient walked in office today was able to complete 3 laps without any oxygen desaturations on room air.  She continues to be maintained on 2 L of O2 at night via her CPAP.   Questionaires / Pulmonary Flowsheets:   ACT:  No flowsheet data found.  MMRC: mMRC Dyspnea Scale mMRC Score  09/19/2020 0    Epworth:  No flowsheet data found.  Tests:   FENO:  No results found for: NITRICOXIDE  PFT: PFT Results Latest Ref Rng & Units 09/19/2020  FVC-Pre L 2.64  FVC-Predicted Pre % 92  FVC-Post L 2.56  FVC-Predicted Post % 89  Pre FEV1/FVC % % 82  Post FEV1/FCV % % 87  FEV1-Pre L 2.16  FEV1-Predicted Pre % 97  FEV1-Post L 2.21  DLCO uncorrected ml/min/mmHg 14.02  DLCO UNC% % 79  DLCO corrected ml/min/mmHg 14.02  DLCO COR %Predicted % 79  DLVA Predicted % 83  TLC L 3.28  TLC % Predicted % 73  RV % Predicted % 36    WALK:  SIX MIN WALK 07/21/2020 10/08/2019  Supplimental Oxygen during Test? (L/min) No No  Tech Comments: - pt walked at an average pace completing all required laps having mild SOB.    Imaging: No results found.  Lab Results:  CBC    Component Value Date/Time   WBC 8.9  06/02/2020 0352   RBC 3.84 (L) 06/02/2020 0352   HGB 11.5 (L) 06/02/2020 0352   HCT 35.0 (L) 06/02/2020 0352   PLT 367 06/02/2020 0352   MCV 91.1 06/02/2020 0352   MCH 29.9 06/02/2020 0352   MCHC 32.9 06/02/2020 0352   RDW 11.4 (L) 06/02/2020 0352   LYMPHSABS 0.7 06/02/2020 0352   MONOABS 0.2 06/02/2020 0352   EOSABS 0.0 06/02/2020 0352   BASOSABS 0.0 06/02/2020 0352    BMET    Component Value Date/Time   NA 141 06/05/2020 0354   NA 143 03/12/2015 1159   K 3.8 06/05/2020 0354   CL 103 06/05/2020 0354   CO2 27 06/05/2020  0354   GLUCOSE 102 (H) 06/05/2020 0354   BUN 21 (H) 06/05/2020 0354   BUN 12 03/12/2015 1159   CREATININE 0.78 06/05/2020 0354   CALCIUM 8.7 (L) 06/05/2020 0354   GFRNONAA >60 06/05/2020 0354   GFRAA >60 06/05/2020 0354    BNP No results found for: BNP  ProBNP No results found for: PROBNP  Specialty Problems      Pulmonary Problems   COUGH    Qualifier: Diagnosis of  By: June Leap        SOB    Qualifier: Diagnosis of  By: June Leap        PULMONARY NODULE, RIGHT LOWER LOBE    Qualifier: Diagnosis of  By: Wynetta Emery RN, Erika        Asthma   Pulmonary nodule, right   OSA on CPAP    Mild with AHI 13/hr       Pneumonia due to COVID-19 virus      Allergies  Allergen Reactions  . Gadolinium Derivatives Other (See Comments)    Severe pain, joints locked up  . Isosorbide Nitrate Anaphylaxis  . Latex Other (See Comments) and Shortness Of Breath    Asthma attack   . Other Shortness Of Breath and Other (See Comments)    Severe pain, joints locked up gadolinium contrast-causes joints to lock up, severe pain Severe pain, joints locked up gadolinium contrast-causes joints to lock up, severe pain  Asthma attack  . Fluticasone-Salmeterol Other (See Comments)    Blurry vision Blurry vision Blurry vision Blurry vision Blurry vision  . Contrast Media [Iodinated Diagnostic Agents]     gadolinium contrast-causes joints to lock up, severe pain  . Doxycycline Itching and Rash  . Sulfamethoxazole-Trimethoprim Diarrhea and Nausea And Vomiting    REACTION: GI upset Other reaction(s): OTHER REACTION: GI upset Other reaction(s): OTHER Other reaction(s): OTHER    Immunization History  Administered Date(s) Administered  . Influenza Whole 07/25/2007  . Influenza,inj,Quad PF,6+ Mos 08/13/2016, 09/12/2019  . Influenza-Unspecified 09/02/2020  . Pneumococcal Conjugate-13 10/11/2013, 10/11/2013  . Pneumococcal Polysaccharide-23 06/14/2017  . Tdap 05/13/2011,  08/13/2016  . Zoster 10/30/2012    Past Medical History:  Diagnosis Date  . Asthma   . Asthma   . Chest pain    LHC 7/08: EF 60%, normal coronaries; ETT-Echo 6/11: EF 55-60%, no ischemia  . Chronic back pain   . CTS (carpal tunnel syndrome)    bil  . CTS (carpal tunnel syndrome)   . Depression 05/28/2020  . Hypothyroidism   . IBS (irritable bowel syndrome)   . Internal hemorrhoids   . Migraines   . OSA on CPAP 11/13/2017   Mild with AHI 13/hr   . Pulmonary nodule, right     Tobacco History: Social History   Tobacco Use  Smoking Status Never  Smoker  Smokeless Tobacco Never Used   Counseling given: Not Answered   Continue to not smoke  Outpatient Encounter Medications as of 09/19/2020  Medication Sig  . albuterol (ACCUNEB) 0.63 MG/3ML nebulizer solution Take by nebulization 3 (three) times daily.  Marland Kitchen albuterol (VENTOLIN HFA) 108 (90 Base) MCG/ACT inhaler Inhale 2 puffs into the lungs every 6 (six) hours.  . ALPRAZolam (XANAX) 0.5 MG tablet Take 0.25 mg by mouth as directed. PRN  . aspirin EC 81 MG tablet Take 1 tablet (81 mg total) by mouth daily.  Marland Kitchen BIOTIN PO Take 1 tablet by mouth as directed.  . budesonide-formoterol (SYMBICORT) 80-4.5 MCG/ACT inhaler Inhale 1 puff into the lungs 2 (two) times daily as needed (asthma).   . CALCIUM PO Take 1 tablet by mouth as directed.  . Cholecalciferol (VITAMIN D3 PO) Take 25,000 Units by mouth once a week.  . citalopram (CELEXA) 20 MG tablet Take 20 mg by mouth daily.   . cyclobenzaprine (FLEXERIL) 10 MG tablet TAKE 1 TABLET THREE TIMES A DAY AS NEEDED FOR MUSCLE SPASMS (Patient taking differently: Take 10 mg by mouth at bedtime. )  . DONNATAL 16.2 MG tablet Take 16.2 mg by mouth daily as needed (Stomach pain).   Marland Kitchen EPINEPHrine 0.3 mg/0.3 mL IJ SOAJ injection Inject 0.3 mg into the muscle as needed for anaphylaxis.   Marland Kitchen guaiFENesin-dextromethorphan (ROBITUSSIN DM) 100-10 MG/5ML syrup Take 5 mLs by mouth every 6 (six) hours as needed  for cough.  Marland Kitchen HYDROcodone-acetaminophen (NORCO/VICODIN) 5-325 MG tablet Take 1 tablet by mouth every 6 (six) hours as needed for moderate pain.  Marland Kitchen ibuprofen (ADVIL,MOTRIN) 800 MG tablet Take 800 mg by mouth every 8 (eight) hours as needed (pain).  . montelukast (SINGULAIR) 10 MG tablet Take 10 mg by mouth at bedtime.  . ondansetron (ZOFRAN) 4 MG tablet Take 1 tablet (4 mg total) by mouth every 6 (six) hours as needed for nausea.  . polyethylene glycol powder (GLYCOLAX/MIRALAX) powder Take 17 g by mouth as needed for mild constipation.   . predniSONE (DELTASONE) 10 MG tablet Take 4 tabs x 2 days, then 3 x 2d, then 2 x 2d, then 1 x 2d and STOP  . Prenat-FeFum-Doc-FA-DHA w/o A (NEXA PLUS) 29-1.25-350 MG CAPS Take 1 tablet by mouth Daily.  Marland Kitchen SYNTHROID 75 MCG tablet Take 50 mcg by mouth daily.   Marland Kitchen topiramate (TOPAMAX) 50 MG tablet Take 50-100 mg by mouth See admin instructions. 50mg  in the morning and 100mg  by mouth at night  . Vitamin D, Ergocalciferol, (DRISDOL) 1.25 MG (50000 UNIT) CAPS capsule Take 50,000 Units by mouth once a week.  . zinc gluconate 50 MG tablet Take 50 mg by mouth daily.  Marland Kitchen zolpidem (AMBIEN) 10 MG tablet Take 0.5 tablets by mouth at bedtime as needed for sleep.   . [DISCONTINUED] budesonide-formoterol (SYMBICORT) 160-4.5 MCG/ACT inhaler Inhale 2 puffs into the lungs daily. (Patient taking differently: Inhale 2 puffs into the lungs every 12 (twelve) hours. )  . budesonide-formoterol (SYMBICORT) 160-4.5 MCG/ACT inhaler Inhale 2 puffs into the lungs 2 (two) times daily.  Marland Kitchen esomeprazole (NEXIUM) 40 MG capsule Take 40 mg by mouth 2 (two) times daily before a meal.   . nitroGLYCERIN (NITROSTAT) 0.4 MG SL tablet Place 1 tablet (0.4 mg total) under the tongue every 5 (five) minutes as needed for chest pain.   No facility-administered encounter medications on file as of 09/19/2020.     Review of Systems  Review of Systems  Constitutional: Negative for  activity change, fatigue and  fever.  HENT: Negative for sinus pressure, sinus pain and sore throat.   Respiratory: Positive for cough and shortness of breath (with doe ). Negative for wheezing.   Cardiovascular: Negative for chest pain and palpitations.  Gastrointestinal: Negative for diarrhea, nausea and vomiting.  Musculoskeletal: Negative for arthralgias.  Neurological: Negative for dizziness.  Psychiatric/Behavioral: Negative for sleep disturbance. The patient is not nervous/anxious.      Physical Exam  BP 122/76 (BP Location: Left Arm, Patient Position: Sitting, Cuff Size: Normal)   Pulse 98   Temp 98.7 F (37.1 C)   Ht 5' (1.524 m)   Wt 154 lb (69.9 kg)   SpO2 96%   BMI 30.08 kg/m   Wt Readings from Last 5 Encounters:  09/19/20 154 lb (69.9 kg)  08/04/20 157 lb (71.2 kg)  07/21/20 156 lb 9.6 oz (71 kg)  05/28/20 152 lb (68.9 kg)  01/14/20 152 lb (68.9 kg)    BMI Readings from Last 5 Encounters:  09/19/20 30.08 kg/m  08/04/20 30.66 kg/m  07/21/20 30.58 kg/m  05/28/20 29.69 kg/m  01/14/20 29.69 kg/m     Physical Exam Vitals and nursing note reviewed.  Constitutional:      General: She is not in acute distress.    Appearance: Normal appearance. She is normal weight.  HENT:     Head: Normocephalic and atraumatic.     Right Ear: Tympanic membrane, ear canal and external ear normal. There is no impacted cerumen.     Left Ear: Tympanic membrane, ear canal and external ear normal. There is no impacted cerumen.     Nose: Nose normal. No congestion.  Cardiovascular:     Rate and Rhythm: Normal rate and regular rhythm.     Pulses: Normal pulses.     Heart sounds: Normal heart sounds. No murmur heard.   Pulmonary:     Effort: Pulmonary effort is normal. No respiratory distress.     Breath sounds: No decreased air movement. No decreased breath sounds, wheezing or rales.  Musculoskeletal:     Cervical back: Normal range of motion.  Skin:    General: Skin is warm and dry.     Capillary  Refill: Capillary refill takes less than 2 seconds.  Neurological:     General: No focal deficit present.     Mental Status: She is alert and oriented to person, place, and time. Mental status is at baseline.     Gait: Gait normal.  Psychiatric:        Mood and Affect: Mood normal.        Behavior: Behavior normal.        Thought Content: Thought content normal.        Judgment: Judgment normal.       Assessment & Plan:   OSA on CPAP Plan: Continue CPAP therapy  Asthma Potential cough variant asthma Contracted COVID-19 required hospitalization in July/2021 Remains unvaccinated Reports adherence to Symbicort 80 Still requires Laurence Ferrari use 4 times a day  Plan: We will increase Symbicort 80 to Symbicort 160 We will schedule 2 month follow-up with Dr. Chase Caller Chest x-ray today Encouraged patient to reconsider stance on COVID-19 vaccination Walk today in office Continue Singulair Start daily antihistamine   Pneumonia due to COVID-19 virus Plan: Chest x-ray today  Healthcare maintenance Plan: Recommend COVID-19 vaccinations, patient declines Patient is aware that she is high risk for complications should she contract COVID-19 again  History of COVID-19 Reviewed pulmonary function testing the  patient Showing mild restriction  Plan: Walk today in office We will change Symbicort 80 to Symbicort 160 Chest x-ray today Agree with upcoming cardiology eval as scheduled by the patient  Chest pain of uncertain etiology History of atypical chest pain Previously evaluated by cardiology Patient reporting more chest pain status post COVID-19 infection in July/2021 Patient reports upcoming appointment with cardiology Reviewed pulmonary function testing with patient today   SOB Unclear etiology of shortness of breath Patient with history of COVID-19 infection Pulmonary function testing today does show mild restriction Normal diffusion defect Patient utilizes her rescue  inhaler 4 times a day due to chest heaviness  Plan: Walk today in office Chest x-ray today Stop Symbicort 80 Start Symbicort 160      Return in about 2 months (around 11/19/2020), or if symptoms worsen or fail to improve, for Follow up with Dr. Purnell Shoemaker.   Lauraine Rinne, NP 09/19/2020   This appointment required 42 minutes of patient care (this includes precharting, chart review, review of results, face-to-face care, etc.).

## 2020-09-19 NOTE — Assessment & Plan Note (Signed)
Unclear etiology of shortness of breath Patient with history of COVID-19 infection Pulmonary function testing today does show mild restriction Normal diffusion defect Patient utilizes her rescue inhaler 4 times a day due to chest heaviness  Plan: Walk today in office Chest x-ray today Stop Symbicort 80 Start Symbicort 160

## 2020-09-19 NOTE — Assessment & Plan Note (Signed)
History of atypical chest pain Previously evaluated by cardiology Patient reporting more chest pain status post COVID-19 infection in July/2021 Patient reports upcoming appointment with cardiology Reviewed pulmonary function testing with patient today

## 2020-09-19 NOTE — Assessment & Plan Note (Signed)
Plan: Continue CPAP therapy 

## 2020-09-19 NOTE — Patient Instructions (Signed)
You were seen today by Lauraine Rinne, NP  for:   1. Shortness of breath  - DG Chest 2 View; Future  Stable walk in office today with no oxygen desaturations  Your pulmonary function test today does show a slight restriction which is difficulty taking in a full deep breath.  This may be related to your recent COVID-19 infection.  We will obtain a chest x-ray today  Work on increasing your overall physical activity  Agree with cardiology evaluation next month  2. Moderate persistent asthma without complication  Start Symbicort 160 >>> 2 puffs in the morning right when you wake up, rinse out your mouth after use, 12 hours later 2 puffs, rinse after use >>> Take this daily, no matter what >>> This is not a rescue inhaler   Stop Symbicort 80  Only use your albuterol as a rescue medication to be used if you can't catch your breath by resting or doing a relaxed purse lip breathing pattern.  - The less you use it, the better it will work when you need it. - Ok to use up to 2 puffs  every 4 hours if you must but call for immediate appointment if use goes up over your usual need - Don't leave home without it !!  (think of it like the spare tire for your car)   3. History of COVID-19 4. Pneumonia due to COVID-19 virus  - DG Chest 2 View; Future  Stable walk in office today  Agree with scheduling appointment with cardiology  5. Chest pain of uncertain etiology  Agree with presenting to cardiology for further evaluation  6. OSA on CPAP  We recommend that you continue using your CPAP daily >>>Keep up the hard work using your device >>> Goal should be wearing this for the entire night that you are sleeping, at least 4 to 6 hours  Remember:   Do not drive or operate heavy machinery if tired or drowsy.   Please notify the supply company and office if you are unable to use your device regularly due to missing supplies or machine being broken.   Work on maintaining a healthy weight  and following your recommended nutrition plan   Maintain proper daily exercise and movement   Maintaining proper use of your device can also help improve management of other chronic illnesses such as: Blood pressure, blood sugars, and weight management.   BiPAP/ CPAP Cleaning:  >>>Clean weekly, with Dawn soap, and bottle brush.  Set up to air dry. >>> Wipe mask out daily with wet wipe or towelette   7. Healthcare maintenance  We recommend the COVID-19 vaccinations   We recommend today:  Orders Placed This Encounter  Procedures   DG Chest 2 View    Standing Status:   Future    Number of Occurrences:   1    Standing Expiration Date:   01/17/2021    Order Specific Question:   Reason for Exam (SYMPTOM  OR DIAGNOSIS REQUIRED)    Answer:   doe, hx of post covid19    Order Specific Question:   Preferred imaging location?    Answer:   Internal    Order Specific Question:   Radiology Contrast Protocol - do NOT remove file path    Answer:   [email or web address]   Orders Placed This Encounter  Procedures   DG Chest 2 View   No orders of the defined types were placed in this encounter.   Follow Up:  Return in about 2 months (around 11/19/2020), or if symptoms worsen or fail to improve, for Follow up with Dr. Purnell Shoemaker.   Notification of test results are managed in the following manner: If there are  any recommendations or changes to the  plan of care discussed in office today,  we will contact you and let you know what they are. If you do not hear from [place], then your results are normal and you can view them through your  MyChart account , or a letter will be sent to you. Thank you again for trusting [place] with your care  - Thank you, [facility]    It is flu season:   >>> Best ways to protect herself from the flu: Receive the yearly flu vaccine, practice good hand hygiene washing with soap and also using hand sanitizer when  available, eat a nutritious meals, get adequate rest, hydrate appropriately       Please contact the office if your symptoms worsen or you have concerns that you are not improving.   Thank you for choosing [facility] for your healthcare, and for allowing [place] to partner with you on your healthcare journey. I am thankful to be able to provide care to you today.   Wyn Quaker FNP-C

## 2020-09-19 NOTE — Assessment & Plan Note (Signed)
Plan: Chest x-ray today 

## 2020-09-22 ENCOUNTER — Telehealth: Payer: Self-pay | Admitting: Pulmonary Disease

## 2020-09-22 DIAGNOSIS — U071 COVID-19: Secondary | ICD-10-CM

## 2020-09-22 DIAGNOSIS — J1282 Pneumonia due to coronavirus disease 2019: Secondary | ICD-10-CM

## 2020-09-22 MED ORDER — AMOXICILLIN-POT CLAVULANATE 875-125 MG PO TABS: 1.0000 | ORAL_TABLET | Freq: Two times a day (BID) | ORAL | 0 refills | Status: DC

## 2020-09-22 MED ORDER — PREDNISONE 10 MG PO TABS: ORAL_TABLET | ORAL | 0 refills | Status: DC

## 2020-09-22 NOTE — Telephone Encounter (Signed)
Spoke with Levander Campion at [facility] radiology  She is calling to give call report on cxr Aaron Edelman ordered 09/19/20 Impression:  IMPRESSION: Ill-defined opacity in the right upper lobe toward the apex with right base atelectasis. Question residua from previous COVID 19 pneumonitis versus new areas of infiltrate/developing infiltrate. Underlying interstitial thickening may indicate a degree of bronchitis.  Heart size within normal limits.  No adenopathy evident.  These results will be called to the ordering clinician or representative by the Radiologist Assistant, and communication documented in the PACS or [employer].   Electronically Signed   By: Lowella Grip III M.D.   On: 09/22/2020 08:49   Will forward to [facility] for review, thanks

## 2020-09-22 NOTE — Telephone Encounter (Signed)
I called and spoke with the pt and notified of results/recs per Aaron Edelman  She verbalized understanding  I have placed order for [facility] repeat cxr and scheduled her for MR 11/18/20  Pred and abx sent to pharm and she is to call if not improving or has any issues

## 2020-09-22 NOTE — Telephone Encounter (Signed)
Called spoke with pharmacist and verified to use correct order for 2 puffs twice a day.   Nothing further needed at this time.

## 2020-09-22 NOTE — Telephone Encounter (Signed)
09/22/2020  Based off chest x-ray results would recommend that we proactively treat the patient for potential bronchitis/pneumonia with antibiotics as well as a course of steroids:  Augmentin >>> Take 1 875-125 mg tablet every 12 hours for the next 7 days >>> Take with food  Prednisone 10mg  tablet  >>>4 tabs for 2 days, then 3 tabs for 2 days, 2 tabs for 2 days, then 1 tab for 2 days, then stop >>>take with food  >>>take in the morning   Please also place an order for a chest x-ray to be completed in 4 weeks.  This can be done at [facility] as the patient lives in [place].  Please also ensure the patient has follow-up scheduled with our office with Dr. Chase Caller in January/2022.  Or ensure the recall is placed.  If symptoms continue to persist or repeat chest x-ray imaging does not show improvement will need to obtain CT imaging.  Wyn Quaker, FNP

## 2020-10-01 ENCOUNTER — Other Ambulatory Visit: Payer: Self-pay

## 2020-10-01 ENCOUNTER — Encounter: Payer: Self-pay | Admitting: Cardiology

## 2020-10-01 ENCOUNTER — Ambulatory Visit (INDEPENDENT_AMBULATORY_CARE_PROVIDER_SITE_OTHER): Payer: BC Managed Care – PPO | Admitting: Cardiology

## 2020-10-01 VITALS — BP 128/84 | HR 79 | Ht 60.0 in | Wt 156.0 lb

## 2020-10-01 DIAGNOSIS — Z79899 Other long term (current) drug therapy: Secondary | ICD-10-CM | POA: Diagnosis not present

## 2020-10-01 DIAGNOSIS — R072 Precordial pain: Secondary | ICD-10-CM

## 2020-10-01 DIAGNOSIS — Z7189 Other specified counseling: Secondary | ICD-10-CM

## 2020-10-01 DIAGNOSIS — R0602 Shortness of breath: Secondary | ICD-10-CM

## 2020-10-01 DIAGNOSIS — R079 Chest pain, unspecified: Secondary | ICD-10-CM

## 2020-10-01 DIAGNOSIS — Z8616 Personal history of COVID-19: Secondary | ICD-10-CM

## 2020-10-01 MED ORDER — DILTIAZEM HCL 30 MG PO TABS: 30.0000 mg | ORAL_TABLET | Freq: Once | ORAL | 0 refills | Status: DC

## 2020-10-01 NOTE — Progress Notes (Signed)
Cardiology Office Note:    Date:  10/01/2020   ID:  Catherine Hancock, DOB 06-Jun-1961, MRN [id number]  PCP:  Tobe Sos, MD  Cardiologist:  Buford Dresser, MD PhD (previously Dr. Saunders Revel)  Referring MD: Tobe Sos, MD   CC: Follow up  History of Present Illness:    Catherine Hancock is a 59 y.o. female with a hx of atypical chest pain, irritable bowel syndrome, hypothyroidism, OSA on CPAP who is seen for follow up today. She had Covid 19 in 05/2020.  Today: Feels like things are going terribly. Got Covid in July, in the hospital for 9 days, received remdesivir and steroids. Has had continuous chest pain and shortness of breath since then. CT showed Covid inflammatory changes. Uses O2 at home at night with her CPAP. Followed by pulmonology. Has chest tightness that improves with use of her inhaler. This is different than her "heart pain," unrelated to exertion, completely random, sometimes triggered by cough.   Also has pain that last all day that is her "heart pain." Nonexertional, can last all day/multiple days in a row. Better with lying down, no other clear aggravating/alleviating factors. It has been more severe on occasion, sometimes a 6/10, worse than it was pre Covid. Worried that it is constant.   Had labs at Dr. Roosvelt Harps office done about two weeks ago, believes this was kidney function and cholesterol.  Past Medical History:  Diagnosis Date  . Asthma   . Asthma   . Chest pain    LHC 7/08: EF 60%, normal coronaries; ETT-Echo 6/11: EF 55-60%, no ischemia  . Chronic back pain   . CTS (carpal tunnel syndrome)    bil  . CTS (carpal tunnel syndrome)   . Depression 05/28/2020  . Hypothyroidism   . IBS (irritable bowel syndrome)   . Internal hemorrhoids   . Migraines   . OSA on CPAP 11/13/2017   Mild with AHI 13/hr   . Pulmonary nodule, right     Past Surgical History:  Procedure Laterality Date  . BREAST EXCISIONAL BIOPSY Right    benign  . breast nodule  surg     right breast nodule and left lymph node removed in 1994  . COLONOSCOPY  06/09/2012   Procedure: COLONOSCOPY;  Surgeon: Lafayette Dragon, MD;  Location: [facility];  Service: Endoscopy;  Laterality: N/A;  . EXCISION OF BREAST BIOPSY Left    2 benign lymph nodes removed  . HEMORRHOID SURGERY     2007  . LYMPH NODE BIOPSY     left breast  . NASAL SINUS SURGERY     1992  . TONSILLECTOMY     1981  . TOTAL VAGINAL HYSTERECTOMY      Current Medications: Current Outpatient Medications on File Prior to Visit  Medication Sig  . albuterol (ACCUNEB) 0.63 MG/3ML nebulizer solution Take by nebulization 3 (three) times daily.  Marland Kitchen albuterol (VENTOLIN HFA) 108 (90 Base) MCG/ACT inhaler Inhale 2 puffs into the lungs every 6 (six) hours.  . ALPRAZolam (XANAX) 0.5 MG tablet Take 0.25 mg by mouth as directed. PRN  . aspirin EC 81 MG tablet Take 1 tablet (81 mg total) by mouth daily.  Marland Kitchen BIOTIN PO Take 1 tablet by mouth as directed.  . budesonide-formoterol (SYMBICORT) 160-4.5 MCG/ACT inhaler Inhale 2 puffs into the lungs 2 (two) times daily.  Marland Kitchen CALCIUM PO Take 1 tablet by mouth as directed.  . Cholecalciferol (VITAMIN D3 PO) Take 25,000 Units by mouth once a  week.  . citalopram (CELEXA) 20 MG tablet Take 20 mg by mouth daily.   . cyclobenzaprine (FLEXERIL) 10 MG tablet TAKE 1 TABLET THREE TIMES A DAY AS NEEDED FOR MUSCLE SPASMS (Patient taking differently: Take 10 mg by mouth at bedtime. )  . DONNATAL 16.2 MG tablet Take 16.2 mg by mouth daily as needed (Stomach pain).   Marland Kitchen EPINEPHrine 0.3 mg/0.3 mL IJ SOAJ injection Inject 0.3 mg into the muscle as needed for anaphylaxis.   Marland Kitchen HYDROcodone-acetaminophen (NORCO/VICODIN) 5-325 MG tablet Take 1 tablet by mouth every 6 (six) hours as needed for moderate pain.  Marland Kitchen ibuprofen (ADVIL,MOTRIN) 800 MG tablet Take 800 mg by mouth every 8 (eight) hours as needed (pain).  . montelukast (SINGULAIR) 10 MG tablet Take 10 mg by mouth at bedtime.  . polyethylene glycol  powder (GLYCOLAX/MIRALAX) powder Take 17 g by mouth as needed for mild constipation.   . predniSONE (DELTASONE) 10 MG tablet 4 x 2 days, 3 x 2 days, 2 x 2 days, 1 x 2 days, then stop  . Prenat-FeFum-Doc-FA-DHA w/o A (NEXA PLUS) 29-1.25-350 MG CAPS Take 1 tablet by mouth Daily.  Marland Kitchen SYNTHROID 75 MCG tablet Take 50 mcg by mouth daily.   Marland Kitchen topiramate (TOPAMAX) 50 MG tablet Take 50-100 mg by mouth See admin instructions. 78m in the morning and 1080mby mouth at night  . Vitamin D, Ergocalciferol, (DRISDOL) 1.25 MG (50000 UNIT) CAPS capsule Take 50,000 Units by mouth once a week.  . zinc gluconate 50 MG tablet Take 50 mg by mouth daily.  . Marland Kitchenolpidem (AMBIEN) 10 MG tablet Take 0.5 tablets by mouth at bedtime as needed for sleep.   . Marland Kitchensomeprazole (NEXIUM) 40 MG capsule Take 40 mg by mouth 2 (two) times daily before a meal.   . nitroGLYCERIN (NITROSTAT) 0.4 MG SL tablet Place 1 tablet (0.4 mg total) under the tongue every 5 (five) minutes as needed for chest pain.   No current facility-administered medications on file prior to visit.     Allergies:   Gadolinium derivatives, Isosorbide nitrate, Latex, Other, Fluticasone-salmeterol, Contrast media [iodinated diagnostic agents], Doxycycline, and Sulfamethoxazole-trimethoprim   Social History   Tobacco Use  . Smoking status: Never Smoker  . Smokeless tobacco: Never Used  Vaping Use  . Vaping Use: Never used  Substance Use Topics  . Alcohol use: No  . Drug use: No    Family History: The patient's family history includes Breast cancer in her maternal aunt; COPD in her father; Colon cancer in her maternal aunt, maternal grandmother, and maternal uncle; Heart disease in her maternal grandfather, paternal grandfather, and paternal uncle; Heart failure in her father; Hypertension in her father; Lymphoma in her mother; Mitral valve prolapse in her sister; Ovarian cancer in her maternal aunt; Prostate cancer in her father.  ROS:   Please see the history of  present illness.  Additional pertinent ROS negative except as documented.  EKGs/Labs/Other Studies Reviewed:    The following studies were reviewed today: Cath/PCI:  LHC (02/01/07): LMCA normal. LAD with 2 large diagonal branches, normal. Dominant LCx without significant disease. Nondominant RCA without significant disease. LVEF 60% without wall motion abnormalities.  CV Surgery:  None  EP Procedures and Devices:  None  Non-Invasive Evaluation(s):  Pharmacologic MPI (04/15/17): Normal study without evidence of ischemia or scar. LVEF 71%.  Exercise stress echo (04/04/17): Submaximal workload achieving 81% MPHR. Normal LVEF at baseline with hyperdynamic response to stress. No EKG changes at submaximal workload.  Cardiac CTA (02/23/12):  Coronary calcium score 0. Left dominant. Normal coronary arteries.  Exercise stress echo (01/14/12): Normal resting and stress echo images. Positive EKG changes and recovery with ST depression. Overall low risk study.  TTE (01/10/12): Normal LV size and function (EF 60-65%) with normal diastolic function. Normal RV size and function. No significant valvular abnormalities.  Exercise stress echo (04/13/10): Normal EKG and stress echocardiogram without evidence of ischemia.  TTE (04/13/10): Normal LV size and thickness (EF 55-60%). Normal diastolic function. No significant valvular abnormalities. Normal RV size and function.  EKG:  EKG is personally reviewed.  The ekg ordered today demonstrates normal sinus rhythm at 79 bpm  Recent Labs: 06/02/2020: ALT 31; Hemoglobin 11.5; Platelets 367 06/05/2020: BUN 21; Creatinine, Ser 0.78; Magnesium 2.3; Potassium 3.8; Sodium 141  Recent Lipid Panel    Component Value Date/Time   TRIG 123 05/28/2020 0758    Physical Exam:    VS:  BP 128/84   Pulse 79   Ht 5' (1.524 m)   Wt 156 lb (70.8 kg)   SpO2 99%   BMI 30.47 kg/m     Wt Readings from Last 3 Encounters:  10/01/20 156 lb (70.8 kg)  09/19/20 154 lb (69.9  kg)  08/04/20 157 lb (71.2 kg)    GEN: Well nourished, well developed in no acute distress HEENT: Normal, moist mucous membranes NECK: No JVD CARDIAC: regular rhythm, normal S1 and S2, no rubs or gallops. No murmur. VASCULAR: Radial and DP pulses 2+ bilaterally. No carotid bruits RESPIRATORY:  Clear to auscultation without rales, wheezing or rhonchi  ABDOMEN: Soft, non-tender, non-distended MUSCULOSKELETAL:  Ambulates independently SKIN: Warm and dry, no edema NEUROLOGIC:  Alert and oriented x 3. No focal neuro deficits noted. PSYCHIATRIC:  Normal affect   ASSESSMENT:    1. Chest pain of uncertain etiology   2. Precordial pain   3. Medication management   4. Shortness of breath   5. History of COVID-19   6. Cardiac risk counseling   7. Counseling on health promotion and disease prevention    PLAN:    Atypical chest pain, shortness of breath:  -no obstructive CAD by cath in 2008. Question microvascular disease, but no improvement with imdur.  -She does have esophageal/GI history including dilations, but she does not feel that this is her chest pain.  -no coronary calcium on recent noncontrast CT 10/2019 -CPX 42021 without cardiopulmonary limitation -given how severe and limiting her symptoms are, she would like to proceed with additional testing. After shared decision making, will schedule CT cardiac for further evaluation. Will use diltiazem for rate control. Will need BMET prior to test -counseled on red flag symptoms that warrant immediate medical attention -has PRN SL NG ordered -did discuss that Covid can have a variety of long term symptoms -reviewed red flag warning signs that need immediate medical attention  CV risk counseling and primary prevention: -recommend heart healthy/Mediterranean diet, with whole grains, fruits, vegetable, fish, lean meats, nuts, and olive oil. Limit salt. -recommend moderate walking, 3-5 times/week for 30-50 minutes each session. Aim for at  least 150 minutes.week. Goal should be pace of 3 miles/hours, or walking 1.5 miles in 30 minutes -recommend avoidance of tobacco products. Avoid excess alcohol. -takes 81 mg aspirin daily. Given her GI symptoms and lack of CAD, if this leads to melena or GI upset, ok to stop.  Plan for follow up: 1 year or sooner as needed. Follow up sooner if CT abnormal  Buford Dresser, MD, PhD, [place]  [facility]   Medication Adjustments/Labs and Tests Ordered: Current medicines are reviewed at length with the patient today.  Concerns regarding medicines are outlined above.  Orders Placed This Encounter  Procedures  . CT CORONARY MORPH W/CTA COR W/SCORE W/CA W/CM &/OR WO/CM  . CT CORONARY FRACTIONAL FLOW RESERVE DATA PREP  . CT CORONARY FRACTIONAL FLOW RESERVE FLUID ANALYSIS  . Basic metabolic panel  . EKG 12-Lead   Meds ordered this encounter  Medications  . DISCONTD: diltiazem (CARDIZEM) 30 MG tablet    Sig: Take 1 tablet (30 mg total) by mouth once for 1 dose. 2 hour prior to procedure    Dispense:  1 tablet    Refill:  0    Patient Instructions  Medication Instructions:  Your Physician recommend you continue on your current medication as directed.    *If you need a refill on your cardiac medications before your next appointment, please call your pharmacy*   Lab Work: Your physician recommends lab work today ( BMP).  If you have labs (blood work) drawn today and your tests are completely normal, you will receive your results only by: Marland Kitchen MyChart Message (if you have MyChart) OR . A paper copy in the mail If you have any lab test that is abnormal or we need to change your treatment, we will call you to review the results.   Testing/Procedures: Cardiac CT Angiography (CTA), is a special type of CT scan that uses a computer to produce multi-dimensional views of major blood vessels throughout the body. In CT angiography, a contrast material is injected through an IV  to help visualize the blood vessels [facility]  Follow-Up: At [facility], you and your health needs are our priority.  As part of our continuing mission to provide you with exceptional heart care, we have created designated Provider Care Teams.  These Care Teams include your primary Cardiologist (physician) and Advanced Practice Providers (APPs -  Physician Assistants and Nurse Practitioners) who all work together to provide you with the care you need, when you need it.  We recommend signing up for the patient portal called "MyChart".  Sign up information is provided on this After Visit Summary.  MyChart is used to connect with patients for Virtual Visits (Telemedicine).  Patients are able to view lab/test results, encounter notes, upcoming appointments, etc.  Non-urgent messages can be sent to your provider as well.   To learn more about what you can do with MyChart, go to [email or web address].    Your next appointment:   1 year(s)  The format for your next appointment:   In Person  Provider:   Buford Dresser, MD  Your cardiac CT will be scheduled at one of the below locations:   [facility] [address] [place], [place] [address] [phone]   If scheduled at [facility], please arrive at the [facility] main entrance of [facility] 30 minutes prior to test start time. Proceed to the [facility] Radiology Department (first floor) to check-in and test prep.  If scheduled at [facility], please arrive 15 mins early for check-in and test prep.  Please follow these instructions carefully (unless otherwise directed):   On the Night Before the Test: . Be sure to Drink plenty of water. . Do not consume any caffeinated/decaffeinated beverages or chocolate 12 hours prior to your test. . Do not take any antihistamines 12 hours prior to your test.   On the Day  of the Test: . Drink plenty of water. Do not  drink any water within one hour of the test. . Do not eat any food 4 hours prior to the test. . You may take your regular medications prior to the test.  . Take Diltiazem 30 mg  two hours prior to test. . FEMALES- please wear underwire-free bra if available         After the Test: . Drink plenty of water. . After receiving IV contrast, you may experience a mild flushed feeling. This is normal. . On occasion, you may experience a mild rash up to 24 hours after the test. This is not dangerous. If this occurs, you can take Benadryl 25 mg and increase your fluid intake. . If you experience trouble breathing, this can be serious. If it is severe call 911 IMMEDIATELY. If it is mild, please call our office. . If you take any of these medications: Glipizide/Metformin, Avandament, Glucavance, please do not take 48 hours after completing test unless otherwise instructed.   Once we have confirmed authorization from your insurance company, we will call you to set up a date and time for your test. Based on how quickly your insurance processes prior authorizations requests, please allow up to 4 weeks to be contacted for scheduling your Cardiac CT appointment. Be advised that routine Cardiac CT appointments could be scheduled as many as 8 weeks after your provider has ordered it.  For non-scheduling related questions, please contact the cardiac imaging nurse navigator should you have any questions/concerns: Marchia Bond, Cardiac Imaging Nurse Navigator Burley Saver, Interim Cardiac Imaging Nurse [place] and Vascular Services Direct Office Dial: [phone]   For scheduling needs, including cancellations and rescheduling, please call [place], [phone].       Signed, Buford Dresser, MD PhD 10/01/2020  [place]

## 2020-10-01 NOTE — Patient Instructions (Addendum)
Medication Instructions:  Your Physician recommend you continue on your current medication as directed.    *If you need a refill on your cardiac medications before your next appointment, please call your pharmacy*   Lab Work: Your physician recommends lab work today ( BMP).  If you have labs (blood work) drawn today and your tests are completely normal, you will receive your results only by: Marland Kitchen MyChart Message (if you have MyChart) OR . A paper copy in the mail If you have any lab test that is abnormal or we need to change your treatment, we will call you to review the results.   Testing/Procedures: Cardiac CT Angiography (CTA), is a special type of CT scan that uses a computer to produce multi-dimensional views of major blood vessels throughout the body. In CT angiography, a contrast material is injected through an IV to help visualize the blood vessels [facility]  Follow-Up: At [facility], you and your health needs are our priority.  As part of our continuing mission to provide you with exceptional heart care, we have created designated Provider Care Teams.  These Care Teams include your primary Cardiologist (physician) and Advanced Practice Providers (APPs -  Physician Assistants and Nurse Practitioners) who all work together to provide you with the care you need, when you need it.  We recommend signing up for the patient portal called "MyChart".  Sign up information is provided on this After Visit Summary.  MyChart is used to connect with patients for Virtual Visits (Telemedicine).  Patients are able to view lab/test results, encounter notes, upcoming appointments, etc.  Non-urgent messages can be sent to your provider as well.   To learn more about what you can do with MyChart, go to [email or web address].    Your next appointment:   1 year(s)  The format for your next appointment:   In Person  Provider:   Buford Dresser, MD  Your cardiac CT will be  scheduled at one of the below locations:   [facility] [address] [place], [place] [address] [phone]   If scheduled at [facility], please arrive at the [facility] main entrance of [facility] 30 minutes prior to test start time. Proceed to the [facility] Radiology Department (first floor) to check-in and test prep.  If scheduled at [facility], please arrive 15 mins early for check-in and test prep.  Please follow these instructions carefully (unless otherwise directed):   On the Night Before the Test: . Be sure to Drink plenty of water. . Do not consume any caffeinated/decaffeinated beverages or chocolate 12 hours prior to your test. . Do not take any antihistamines 12 hours prior to your test.   On the Day of the Test: . Drink plenty of water. Do not drink any water within one hour of the test. . Do not eat any food 4 hours prior to the test. . You may take your regular medications prior to the test.  . Take Diltiazem 30 mg  two hours prior to test. . FEMALES- please wear underwire-free bra if available         After the Test: . Drink plenty of water. . After receiving IV contrast, you may experience a mild flushed feeling. This is normal. . On occasion, you may experience a mild rash up to 24 hours after the test. This is not dangerous. If this occurs, you can take Benadryl 25 mg and increase your fluid intake. Marland Kitchen  If you experience trouble breathing, this can be serious. If it is severe call 911 IMMEDIATELY. If it is mild, please call our office. . If you take any of these medications: Glipizide/Metformin, Avandament, Glucavance, please do not take 48 hours after completing test unless otherwise instructed.   Once we have confirmed authorization from your insurance company, we will call you to set up a date and time for your test. Based on how quickly your insurance processes prior authorizations requests,  please allow up to 4 weeks to be contacted for scheduling your Cardiac CT appointment. Be advised that routine Cardiac CT appointments could be scheduled as many as 8 weeks after your provider has ordered it.  For non-scheduling related questions, please contact the cardiac imaging nurse navigator should you have any questions/concerns: Marchia Bond, Cardiac Imaging Nurse Navigator Burley Saver, Interim Cardiac Imaging Nurse [place] and Vascular Services Direct Office Dial: [phone]   For scheduling needs, including cancellations and rescheduling, please call [place], [phone].

## 2020-10-02 LAB — BASIC METABOLIC PANEL
BUN/Creatinine Ratio: 20 (ref 9–23)
BUN: 12 mg/dL (ref 6–24)
CO2: 21 mmol/L (ref 20–29)
Calcium: 9.6 mg/dL (ref 8.7–10.2)
Chloride: 107 mmol/L — ABNORMAL HIGH (ref 96–106)
Creatinine, Ser: 0.6 mg/dL (ref 0.57–1.00)
GFR calc Af Amer: 115 mL/min/{1.73_m2} (ref >59)
GFR calc non Af Amer: 100 mL/min/{1.73_m2} (ref >59)
Glucose: 88 mg/dL (ref 65–99)
Potassium: 4 mmol/L (ref 3.5–5.2)
Sodium: 141 mmol/L (ref 134–144)

## 2020-10-14 ENCOUNTER — Telehealth: Payer: Self-pay | Admitting: Pulmonary Disease

## 2020-10-14 NOTE — Telephone Encounter (Signed)
10/14/20  Reviewed CPAP compliance report those results are listed below:  08/20/2020-09/18/2020-25 at the last 30 days use, 25 those days greater than 4 hours, average usage 7 hours and 30 minutes, CPAP set pressure of 8, AHI 4.2  Please of the patient know that shows adequate compliance as well as a well-controlled AHI.  No new recommendations.  Wyn Quaker, FNP

## 2020-10-15 NOTE — Telephone Encounter (Signed)
I called and went over ONO results. Patient verbalized understanding and nothing further was needed.

## 2020-10-17 ENCOUNTER — Telehealth (HOSPITAL_COMMUNITY): Payer: Self-pay | Admitting: Emergency Medicine

## 2020-10-17 NOTE — Telephone Encounter (Signed)
Attempted to call patient regarding upcoming cardiac CT appointment. °Left message on voicemail with name and callback number °Lambert Jeanty RN Navigator Cardiac Imaging ° Heart and Vascular Services °[phone] Office °[phone] Cell ° °

## 2020-10-20 ENCOUNTER — Other Ambulatory Visit: Payer: Self-pay

## 2020-10-20 ENCOUNTER — Ambulatory Visit (HOSPITAL_COMMUNITY)
Admission: RE | Admit: 2020-10-20 | Discharge: 2020-10-20 | Disposition: A | Discharge status: Home / Self Care | Payer: BC Managed Care – PPO | Source: Ambulatory Visit | Attending: Cardiology | Admitting: Cardiology

## 2020-10-20 ENCOUNTER — Ambulatory Visit (HOSPITAL_COMMUNITY): Payer: BC Managed Care – PPO

## 2020-10-20 DIAGNOSIS — R072 Precordial pain: Secondary | ICD-10-CM | POA: Insufficient documentation

## 2020-10-20 DIAGNOSIS — Z006 Encounter for examination for normal comparison and control in clinical research program: Secondary | ICD-10-CM

## 2020-10-20 MED ORDER — IOHEXOL 350 MG/ML SOLN
100.0000 mL | Freq: Once | INTRAVENOUS | Status: AC | PRN
  Administered 2020-10-20: 100 mL via INTRAVENOUS

## 2020-10-20 MED ORDER — NITROGLYCERIN 0.4 MG SL SUBL
0.8000 mg | SUBLINGUAL_TABLET | Freq: Once | SUBLINGUAL | Status: AC
  Administered 2020-10-20: 0.8 mg via SUBLINGUAL

## 2020-10-20 MED ORDER — NITROGLYCERIN 0.4 MG SL SUBL
SUBLINGUAL_TABLET | SUBLINGUAL | Status: AC
  Filled 2020-10-20: qty 2

## 2020-10-20 NOTE — Research (Signed)
IDENTIFY Informed Consent   Subject Name: Catherine Hancock  Subject met inclusion and exclusion criteria.  The informed consent form, study requirements and expectations were reviewed with the subject and questions and concerns were addressed prior to the signing of the consent form.  The subject verbalized understanding of the trial requirements.  The subject agreed to participate in the IDENTIFY trial and signed the informed consent at 1142 on 20/DEC/2021  The informed consent was obtained prior to performance of any protocol-specific procedures for the subject.  A copy of the signed informed consent was given to the subject and a copy was placed in the subject's medical record.   Sherlyn Lees

## 2020-11-18 ENCOUNTER — Ambulatory Visit: Payer: BC Managed Care – PPO | Admitting: Internal Medicine

## 2020-11-19 ENCOUNTER — Other Ambulatory Visit: Payer: Self-pay

## 2020-11-19 ENCOUNTER — Encounter (HOSPITAL_COMMUNITY)
Admission: RE | Admit: 2020-11-19 | Discharge: 2020-11-19 | Disposition: A | Discharge status: Home / Self Care | Payer: BC Managed Care – PPO | Source: Ambulatory Visit | Attending: Internal Medicine | Admitting: Internal Medicine

## 2020-11-19 VITALS — BP 112/72 | HR 75 | Ht 60.0 in | Wt 157.2 lb

## 2020-11-19 DIAGNOSIS — R0602 Shortness of breath: Secondary | ICD-10-CM | POA: Insufficient documentation

## 2020-11-19 NOTE — Progress Notes (Signed)
Cardiac/Pulmonary Rehab Medication Review by a Pharmacist  Does the patient  feel that his/her medications are working for him/her?  yes  Has the patient been experiencing any side effects to the medications prescribed?  no  Does the patient measure his/her own blood pressure or blood glucose at home?  no   Does the patient have any problems obtaining medications due to transportation or finances?   no  Understanding of regimen: good Understanding of indications: good Potential of compliance: good  Questions asked to Determine Patient Understanding of Medication Regimen:  1. What is the name of the medication?  2. What is the medication used for?  3. When should it be taken?  4. How much should be taken?  5. How will you take it?  6. What side effects should you report?  Understanding Defined as: Excellent: All questions above are correct Good: Questions 1-4 are correct Fair: Questions 1-2 are correct  Poor: 1 or none of the above questions are correct   Pharmacist comments: Patient presents to pulmonary rehab post COVID infection in 05/2020(patient was not vaccinated) and required hospitalization. She is still without energy and some shortness of breath with exertion. She reports adherence to daily medication. She is tolerating her current regimen without any issues. She is to start Trulance but has started it yet. No issues identified. Continue current regimen.  Thanks for the opportunity to participate in the care of this patient,   Isac Sarna, BS [place], [place] Clinical Pharmacist Pager [phone] 11/19/2020 1:55 PM

## 2020-11-19 NOTE — Progress Notes (Signed)
Pulmonary Individual Treatment Plan  Patient Details  Name: Catherine Hancock MRN: [id number] Date of Birth: September 23, 1961 Referring Provider:   Flowsheet Row PULMONARY REHAB OTHER RESP ORIENTATION from 11/19/2020 in [place]  Referring Provider Dr. Chase Caller      Initial Encounter Date:  Flowsheet Row PULMONARY REHAB OTHER RESP ORIENTATION from 11/19/2020 in [place]  Date 11/19/20      Visit Diagnosis: Shortness of breath  Patient's Home Medications on Admission:   Current Outpatient Medications:  .  azelastine (ASTELIN) 0.1 % nasal spray, Place 2 sprays into the nose 4 (four) times daily as needed., Disp: , Rfl:  .  budesonide-formoterol (SYMBICORT) 160-4.5 MCG/ACT inhaler, Inhale 2 puffs into the lungs 2 (two) times daily., Disp: , Rfl:  .  Calcium Carb-Cholecalciferol (CALCIUM 600+D3) 600-800 MG-UNIT TABS, Take 1 tablet by mouth daily., Disp: , Rfl:  .  Cholecalciferol (VITAMIN D) 50 MCG (2000 UT) CAPS, Take 1 capsule by mouth daily with breakfast. Takes in addition to weekly ergocalciferol, Disp: , Rfl:  .  diclofenac (VOLTAREN) 50 MG EC tablet, Take 1 tablet by mouth 2 (two) times daily., Disp: , Rfl:  .  levocetirizine (XYZAL) 5 MG tablet, Take 1 tablet by mouth daily., Disp: , Rfl:  .  lubiprostone (AMITIZA) 24 MCG capsule, Take 1 capsule by mouth 2 (two) times daily., Disp: , Rfl:  .  meclizine (ANTIVERT) 25 MG tablet, Take 1 tablet by mouth 3 (three) times daily as needed., Disp: , Rfl:  .  albuterol (ACCUNEB) 0.63 MG/3ML nebulizer solution, Take by nebulization 3 (three) times daily., Disp: , Rfl:  .  albuterol (VENTOLIN HFA) 108 (90 Base) MCG/ACT inhaler, Inhale 2 puffs into the lungs every 6 (six) hours., Disp: 8 g, Rfl: 0 .  ALPRAZolam (XANAX) 0.5 MG tablet, Take 0.25 mg by mouth as directed. PRN, Disp: , Rfl:  .  aspirin EC 81 MG tablet, Take 1 tablet (81 mg total) by mouth daily., Disp: , Rfl:  .  BIOTIN PO, Take 1 tablet by  mouth as directed., Disp: , Rfl:  .  budesonide-formoterol (SYMBICORT) 160-4.5 MCG/ACT inhaler, Inhale 2 puffs into the lungs 2 (two) times daily., Disp: 1 each, Rfl: 6 .  Cholecalciferol (VITAMIN D3 PO), Take 25,000 Units by mouth once a week., Disp: , Rfl:  .  citalopram (CELEXA) 20 MG tablet, Take 20 mg by mouth daily. , Disp: , Rfl:  .  cyclobenzaprine (FLEXERIL) 10 MG tablet, TAKE 1 TABLET THREE TIMES A DAY AS NEEDED FOR MUSCLE SPASMS (Patient taking differently: Take 10 mg by mouth at bedtime. ), Disp: 90 tablet, Rfl: 0 .  DONNATAL 16.2 MG tablet, Take 16.2 mg by mouth daily as needed (Stomach pain). , Disp: , Rfl:  .  EPINEPHrine 0.3 mg/0.3 mL IJ SOAJ injection, Inject 0.3 mg into the muscle as needed for anaphylaxis. , Disp: , Rfl:  .  esomeprazole (NEXIUM) 40 MG capsule, Take 40 mg by mouth 2 (two) times daily before a meal. , Disp: , Rfl:  .  HYDROcodone-acetaminophen (NORCO/VICODIN) 5-325 MG tablet, Take 1 tablet by mouth every 6 (six) hours as needed for moderate pain., Disp: , Rfl:  .  ibuprofen (ADVIL,MOTRIN) 800 MG tablet, Take 800 mg by mouth every 8 (eight) hours as needed (pain)., Disp: , Rfl:  .  levothyroxine (SYNTHROID) 50 MCG tablet, Take 50 mcg by mouth daily. , Disp: , Rfl:  .  montelukast (SINGULAIR) 10 MG tablet, Take 10 mg by mouth  at bedtime., Disp: , Rfl:  .  nitroGLYCERIN (NITROSTAT) 0.4 MG SL tablet, Place 1 tablet (0.4 mg total) under the tongue every 5 (five) minutes as needed for chest pain., Disp: 25 tablet, Rfl: 12 .  polyethylene glycol powder (GLYCOLAX/MIRALAX) powder, Take 17 g by mouth as needed for mild constipation. , Disp: , Rfl:  .  predniSONE (DELTASONE) 10 MG tablet, 4 x 2 days, 3 x 2 days, 2 x 2 days, 1 x 2 days, then stop, Disp: 20 tablet, Rfl: 0 .  Prenatal Vit-Fe Fumarate-FA (M-NATAL PLUS) 27-1 MG TABS, Take 1 tablet by mouth at bedtime., Disp: , Rfl:  .  topiramate (TOPAMAX) 50 MG tablet, Take 50-100 mg by mouth See admin instructions. 50mg  in the  morning and 100mg  by mouth at night, Disp: , Rfl:  .  TRULANCE 3 MG TABS, Take 1 tablet by mouth daily., Disp: , Rfl:  .  Vitamin D, Ergocalciferol, (DRISDOL) 1.25 MG (50000 UNIT) CAPS capsule, Take 50,000 Units by mouth once a week., Disp: , Rfl:  .  zinc gluconate 50 MG tablet, Take 50 mg by mouth daily., Disp: , Rfl:  .  zolpidem (AMBIEN) 10 MG tablet, Take 0.5 tablets by mouth at bedtime as needed for sleep. , Disp: , Rfl:   Past Medical History: Past Medical History:  Diagnosis Date  . Asthma   . Asthma   . Chest pain    LHC 7/08: EF 60%, normal coronaries; ETT-Echo 6/11: EF 55-60%, no ischemia  . Chronic back pain   . CTS (carpal tunnel syndrome)    bil  . CTS (carpal tunnel syndrome)   . Depression 05/28/2020  . Hypothyroidism   . IBS (irritable bowel syndrome)   . Internal hemorrhoids   . Migraines   . OSA on CPAP 11/13/2017   Mild with AHI 13/hr   . Pulmonary nodule, right     Tobacco Use: Social History   Tobacco Use  Smoking Status Never Smoker  Smokeless Tobacco Never Used    Labs: Recent Review Scientist, physiological    Labs for ITP Cardiac and Pulmonary Rehab Latest Ref Rng & Units 05/28/2020   Trlycerides <150 mg/dL 123      Capillary Blood Glucose: No results found for: GLUCAP   Pulmonary Assessment Scores:  Pulmonary Assessment Scores    Row Name 11/19/20 1321         ADL UCSD   ADL Phase Entry     SOB Score total 59           CAT Score   CAT Score 22           mMRC Score   mMRC Score 2           UCSD: Self-administered rating of dyspnea associated with activities of daily living (ADLs) 6-point scale (0 = "not at all" to 5 = "maximal or unable to do because of breathlessness")  Scoring Scores range from 0 to 120.  Minimally important difference is 5 units  CAT: CAT can identify the health impairment of COPD patients and is better correlated with disease progression.  CAT has a scoring range of zero to 40. The CAT score is classified into  four groups of low (less than 10), medium (10 - 20), high (21-30) and very high (31-40) based on the impact level of disease on health status. A CAT score over 10 suggests significant symptoms.  A worsening CAT score could be explained by an exacerbation, poor medication adherence, poor  inhaler technique, or progression of COPD or comorbid conditions.  CAT MCID is 2 points  mMRC: mMRC (Modified Medical Research Council) Dyspnea Scale is used to assess the degree of baseline functional disability in patients of respiratory disease due to dyspnea. No minimal important difference is established. A decrease in score of 1 point or greater is considered a positive change.   Pulmonary Function Assessment:  Pulmonary Function Assessment - 11/19/20 1310      Breath   Shortness of Breath Yes;Limiting activity           Exercise Target Goals: Exercise Program Goal: Individual exercise prescription set using results from initial 6 min walk test and THRR while considering  patient's activity barriers and safety.   Exercise Prescription Goal: Initial exercise prescription builds to 30-45 minutes a day of aerobic activity, 2-3 days per week.  Home exercise guidelines will be given to patient during program as part of exercise prescription that the participant will acknowledge.  Activity Barriers & Risk Stratification:  Activity Barriers & Cardiac Risk Stratification - 11/19/20 1311      Activity Barriers & Cardiac Risk Stratification   Activity Barriers Deconditioning;Shortness of Breath;Chest Pain/Angina    Cardiac Risk Stratification Low           6 Minute Walk:  6 Minute Walk    Row Name 11/19/20 1433         6 Minute Walk   Phase Initial     Distance 1400 feet     Walk Time 6 minutes     # of Rest Breaks 0     MPH 3.2     METS 3.92     RPE 11     Perceived Dyspnea  11     VO2 Peak 13.72     Symptoms No     Resting HR 75 bpm     Resting BP 112/72     Resting Oxygen  Saturation  96 %     Exercise Oxygen Saturation  during 6 min walk 96 %     Max Ex. HR 102 bpm     Max Ex. BP 130/78     2 Minute Post BP 120/80           Interval HR   1 Minute HR 94     2 Minute HR 98     3 Minute HR 101     4 Minute HR 101     5 Minute HR 102     6 Minute HR 101     2 Minute Post HR 86     Interval Heart Rate? Yes           Interval Oxygen   Interval Oxygen? Yes     Baseline Oxygen Saturation % 96 %     1 Minute Oxygen Saturation % 96 %     1 Minute Liters of Oxygen 0 L     2 Minute Oxygen Saturation % 97 %     2 Minute Liters of Oxygen 0 L     3 Minute Oxygen Saturation % 97 %     3 Minute Liters of Oxygen 0 L     4 Minute Oxygen Saturation % 97 %     4 Minute Liters of Oxygen 0 L     5 Minute Oxygen Saturation % 96 %     5 Minute Liters of Oxygen 0 L     6 Minute Oxygen Saturation % 96 %  6 Minute Liters of Oxygen 0 L     2 Minute Post Oxygen Saturation % 98 %     2 Minute Post Liters of Oxygen 0 L            Oxygen Initial Assessment:  Oxygen Initial Assessment - 11/19/20 1444      Initial 6 min Walk   Oxygen Used None      Program Oxygen Prescription   Program Oxygen Prescription None   she has been using 2 L PRN since she got out of the hospital in July of 2020 but did not require it during her walk test. We will start her on RA and monitor her levels.     Intervention   Short Term Goals To learn and exhibit compliance with exercise, home and travel O2 prescription;To learn and understand importance of monitoring SPO2 with pulse oximeter and demonstrate accurate use of the pulse oximeter.;To learn and understand importance of maintaining oxygen saturations>88%;To learn and demonstrate proper pursed lip breathing techniques or other breathing techniques.;To learn and demonstrate proper use of respiratory medications    Long  Term Goals Exhibits compliance with exercise, home and travel O2 prescription;Maintenance of O2  saturations>88%;Verbalizes importance of monitoring SPO2 with pulse oximeter and return demonstration;Compliance with respiratory medication;Demonstrates proper use of MDI's;Exhibits proper breathing techniques, such as pursed lip breathing or other method taught during program session           Oxygen Re-Evaluation:   Oxygen Discharge (Final Oxygen Re-Evaluation):   Initial Exercise Prescription:  Initial Exercise Prescription - 11/19/20 1400      Date of Initial Exercise RX and Referring Provider   Date 11/19/20    Referring Provider Dr. Chase Caller    Expected Discharge Date 03/19/21      NuStep   Level 1    SPM 80    Minutes 22      Arm Ergometer   Level 1    RPM 60    Minutes 22      Intensity   THRR 40-80% of Max Heartrate 64-129    Ratings of Perceived Exertion 11-13    Perceived Dyspnea 0-4      Progression   Progression Continue to progress workloads to maintain intensity without signs/symptoms of physical distress.      Resistance Training   Training Prescription Yes    Weight 2    Reps 10-15           Perform Capillary Blood Glucose checks as needed.  Exercise Prescription Changes:   Exercise Comments:   Exercise Goals and Review:  Exercise Goals    Row Name 11/19/20 1437             Exercise Goals   Increase Physical Activity Yes       Intervention Develop an individualized exercise prescription for aerobic and resistive training based on initial evaluation findings, risk stratification, comorbidities and participant's personal goals.;Provide advice, education, support and counseling about physical activity/exercise needs.       Expected Outcomes Short Term: Attend rehab on a regular basis to increase amount of physical activity.;Long Term: Add in home exercise to make exercise part of routine and to increase amount of physical activity.;Long Term: Exercising regularly at least 3-5 days a week.       Increase Strength and Stamina Yes        Intervention Provide advice, education, support and counseling about physical activity/exercise needs.;Develop an individualized exercise prescription for aerobic and resistive training based  on initial evaluation findings, risk stratification, comorbidities and participant's personal goals.       Expected Outcomes Short Term: Increase workloads from initial exercise prescription for resistance, speed, and METs.;Short Term: Perform resistance training exercises routinely during rehab and add in resistance training at home;Long Term: Improve cardiorespiratory fitness, muscular endurance and strength as measured by increased METs and functional capacity (6MWT)       Able to understand and use rate of perceived exertion (RPE) scale Yes       Intervention Provide education and explanation on how to use RPE scale       Expected Outcomes Short Term: Able to use RPE daily in rehab to express subjective intensity level;Long Term:  Able to use RPE to guide intensity level when exercising independently       Able to understand and use Dyspnea scale Yes       Intervention Provide education and explanation on how to use Dyspnea scale       Expected Outcomes Short Term: Able to use Dyspnea scale daily in rehab to express subjective sense of shortness of breath during exertion;Long Term: Able to use Dyspnea scale to guide intensity level when exercising independently       Knowledge and understanding of Target Heart Rate Range (THRR) Yes       Intervention Provide education and explanation of THRR including how the numbers were predicted and where they are located for reference       Expected Outcomes Short Term: Able to state/look up THRR;Long Term: Able to use THRR to govern intensity when exercising independently;Short Term: Able to use daily as guideline for intensity in rehab       Able to check pulse independently Yes       Intervention Provide education and demonstration on how to check pulse in carotid and  radial arteries.;Review the importance of being able to check your own pulse for safety during independent exercise       Expected Outcomes Short Term: Able to explain why pulse checking is important during independent exercise;Long Term: Able to check pulse independently and accurately       Understanding of Exercise Prescription Yes       Intervention Provide education, explanation, and written materials on patient's individual exercise prescription       Expected Outcomes Short Term: Able to explain program exercise prescription;Long Term: Able to explain home exercise prescription to exercise independently              Exercise Goals Re-Evaluation :   Discharge Exercise Prescription (Final Exercise Prescription Changes):   Nutrition:  Target Goals: Understanding of nutrition guidelines, daily intake of sodium 1500mg , cholesterol 200mg , calories 30% from fat and 7% or less from saturated fats, daily to have 5 or more servings of fruits and vegetables.  Biometrics:  Pre Biometrics - 11/19/20 1437      Pre Biometrics   Height 5' (1.524 m)    Weight 157 lb 3 oz (71.3 kg)    Waist Circumference 37 inches    Hip Circumference 36.5 inches    Waist to Hip Ratio 1.01 %    BMI (Calculated) 30.7    Triceps Skinfold 27 mm    % Body Fat 30.5 %    Grip Strength 20.4 kg    Single Leg Stand 5 seconds            Nutrition Therapy Plan and Nutrition Goals:   Nutrition Assessments:  Nutrition Assessments - 11/19/20 1315  MEDFICTS Scores   Pre Score 23          MEDIFICTS Score Key:  ?70 Need to make dietary changes   40-70 Heart Healthy Diet  ? 40 Therapeutic Level Cholesterol Diet   Picture Your Plate Scores:  <71 Unhealthy dietary pattern with much room for improvement.  41-50 Dietary pattern unlikely to meet recommendations for good health and room for improvement.  51-60 More healthful dietary pattern, with some room for improvement.   >60 Healthy dietary  pattern, although there may be some specific behaviors that could be improved.    Nutrition Goals Re-Evaluation:   Nutrition Goals Discharge (Final Nutrition Goals Re-Evaluation):   Psychosocial: Target Goals: Acknowledge presence or absence of significant depression and/or stress, maximize coping skills, provide positive support system. Participant is able to verbalize types and ability to use techniques and skills needed for reducing stress and depression.  Initial Review & Psychosocial Screening:  Initial Psych Review & Screening - 11/19/20 1313      Initial Review   Current issues with None Identified      Family Dynamics   Good Support System? Yes    Comments Her husband, her sons, and her sister all support her. She feels like she has a great support system. She has a son in [place] and another son in [place]. They speak daily and she sees them often.      Barriers   Psychosocial barriers to participate in program There are no identifiable barriers or psychosocial needs.      Screening Interventions   Interventions Encouraged to exercise    Expected Outcomes Long Term goal: The participant improves quality of Life and PHQ9 Scores as seen by post scores and/or verbalization of changes;Long Term Goal: Stressors or current issues are controlled or eliminated.           Quality of Life Scores:  Quality of Life - 11/19/20 1438      Quality of Life   Select Quality of Life      Quality of Life Scores   Health/Function Pre 18.5 %    Socioeconomic Pre 28.75 %    Family Pre 27.3 %    GLOBAL Pre 22.08 %          Scores of 19 and below usually indicate a poorer quality of life in these areas.  A difference of  2-3 points is a clinically meaningful difference.  A difference of 2-3 points in the total score of the Quality of Life Index has been associated with significant improvement in overall quality of life, self-image, physical symptoms, and general health in studies  assessing change in quality of life.   PHQ-9: Recent Review Flowsheet Data    Depression screen [facility] 2/9 11/19/2020 02/23/2012   Decreased Interest 0 0   Down, Depressed, Hopeless 0 0   PHQ - 2 Score 0 0   Altered sleeping 0 -   Tired, decreased energy 3  -   Change in appetite 0 -   Feeling bad or failure about yourself  0 -   Trouble concentrating 0 -   Moving slowly or fidgety/restless 0 -   Suicidal thoughts 0 -   PHQ-9 Score 3 -   Difficult doing work/chores Not difficult at all -     Interpretation of Total Score  Total Score Depression Severity:  1-4 = Minimal depression, 5-9 = Mild depression, 10-14 = Moderate depression, 15-19 = Moderately severe depression, 20-27 = Severe depression  Psychosocial Evaluation and Intervention:  Psychosocial Evaluation - 11/19/20 1446      Psychosocial Evaluation & Interventions   Interventions Encouraged to exercise with the program and follow exercise prescription    Comments Patient has no identifiable psychosocial issues.    Expected Outcomes Patient will continue to not have any identifiable psychosocial issues.    Continue Psychosocial Services  No Follow up required           Psychosocial Re-Evaluation:   Psychosocial Discharge (Final Psychosocial Re-Evaluation):    Education: Education Goals: Education classes will be provided on a weekly basis, covering required topics. Participant will state understanding/return demonstration of topics presented.  Learning Barriers/Preferences:  Learning Barriers/Preferences - 11/19/20 1315      Learning Barriers/Preferences   Learning Barriers None    Learning Preferences Audio;Written Material;Skilled Demonstration;Verbal Instruction;Video;Computer/Internet;Group Instruction;Individual Instruction;Pictoral           Education Topics: How Lungs Work and Diseases: - Discuss the anatomy of the lungs and diseases that can affect the lungs, such as COPD.   Exercise: -Discuss  the importance of exercise, FITT principles of exercise, normal and abnormal responses to exercise, and how to exercise safely.   Environmental Irritants: -Discuss types of environmental irritants and how to limit exposure to environmental irritants.   Meds/Inhalers and oxygen: - Discuss respiratory medications, definition of an inhaler and oxygen, and the proper way to use an inhaler and oxygen.   Energy Saving Techniques: - Discuss methods to conserve energy and decrease shortness of breath when performing activities of daily living.    Bronchial Hygiene / Breathing Techniques: - Discuss breathing mechanics, pursed-lip breathing technique,  proper posture, effective ways to clear airways, and other functional breathing techniques   Cleaning Equipment: - Provides group verbal and written instruction about the health risks of elevated stress, cause of high stress, and healthy ways to reduce stress.   Nutrition I: Fats: - Discuss the types of cholesterol, what cholesterol does to the body, and how cholesterol levels can be controlled.   Nutrition II: Labels: -Discuss the different components of food labels and how to read food labels.   Respiratory Infections: - Discuss the signs and symptoms of respiratory infections, ways to prevent respiratory infections, and the importance of seeking medical treatment when having a respiratory infection.   Stress I: Signs and Symptoms: - Discuss the causes of stress, how stress may lead to anxiety and depression, and ways to limit stress.   Stress II: Relaxation: -Discuss relaxation techniques to limit stress.   Oxygen for Home/Travel: - Discuss how to prepare for travel when on oxygen and proper ways to transport and store oxygen to ensure safety.   Knowledge Questionnaire Score:  Knowledge Questionnaire Score - 11/19/20 1318      Knowledge Questionnaire Score   Pre Score 15/18           Core Components/Risk  Factors/Patient Goals at Admission:  Personal Goals and Risk Factors at Admission - 11/19/20 1318      Core Components/Risk Factors/Patient Goals on Admission    Weight Management Weight Loss;Yes    Intervention Weight Management: Develop a combined nutrition and exercise program designed to reach desired caloric intake, while maintaining appropriate intake of nutrient and fiber, sodium and fats, and appropriate energy expenditure required for the weight goal.;Weight Management: Provide education and appropriate resources to help participant work on and attain dietary goals.;Weight Management/Obesity: Establish reasonable short term and long term weight goals.;Obesity: Provide education and appropriate resources to help  participant work on and attain dietary goals.    Expected Outcomes Short Term: Continue to assess and modify interventions until short term weight is achieved;Long Term: Adherence to nutrition and physical activity/exercise program aimed toward attainment of established weight goal;Weight Maintenance: Understanding of the daily nutrition guidelines, which includes 25-35% calories from fat, 7% or less cal from saturated fats, less than 200mg  cholesterol, less than 1.5gm of sodium, & 5 or more servings of fruits and vegetables daily;Weight Loss: Understanding of general recommendations for a balanced deficit meal plan, which promotes 1-2 lb weight loss per week and includes a negative energy balance of [phone] kcal/d;Understanding recommendations for meals to include 15-35% energy as protein, 25-35% energy from fat, 35-60% energy from carbohydrates, less than 200mg  of dietary cholesterol, 20-35 gm of total fiber daily;Understanding of distribution of calorie intake throughout the day with the consumption of 4-5 meals/snacks;Weight Gain: Understanding of general recommendations for a high calorie, high protein meal plan that promotes weight gain by distributing calorie intake throughout the day  with the consumption for 4-5 meals, snacks, and/or supplements    Improve shortness of breath with ADL's Yes    Intervention Provide education, individualized exercise plan and daily activity instruction to help decrease symptoms of SOB with activities of daily living.    Expected Outcomes Short Term: Improve cardiorespiratory fitness to achieve a reduction of symptoms when performing ADLs;Long Term: Be able to perform more ADLs without symptoms or delay the onset of symptoms    Personal Goal Other Yes    Personal Goal To be able to come off of supplemental oxygen. She has been on it since she was hospitialized with COVID in July of 2021.    Intervention Exercise at rehab and begin a home exercise program.    Expected Outcomes She will be able to reduce her oxygen utilization following rehab.           Core Components/Risk Factors/Patient Goals Review:    Core Components/Risk Factors/Patient Goals at Discharge (Final Review):    ITP Comments:   Comments: Patient arrived for 1st visit/orientation/education at 1230. Patient was referred to PR by Dr. Chase Caller due to Shortness of breath. During orientation advised patient on arrival and appointment times what to wear, what to do before, during and after exercise. Reviewed attendance and class policy.  Pt is scheduled to return to pulmonary Rehab on 11/25/2020 at 1045. Pt was advised to come to class 15 minutes before class starts.  Discussed RPE/Dpysnea scales. Patient participated in warm up stretches. Patient was able to complete 6 minute walk test. Patient was measured for the equipment. Discussed equipment safety with patient. Took patient pre-anthropometric measurements. Patient finished visit at 1425.

## 2020-11-25 ENCOUNTER — Encounter (HOSPITAL_COMMUNITY)
Admission: RE | Admit: 2020-11-25 | Discharge: 2020-11-25 | Disposition: A | Discharge status: Home / Self Care | Payer: BC Managed Care – PPO | Source: Ambulatory Visit | Attending: Internal Medicine | Admitting: Internal Medicine

## 2020-11-25 ENCOUNTER — Other Ambulatory Visit: Payer: Self-pay

## 2020-11-25 VITALS — Wt 158.7 lb

## 2020-11-25 DIAGNOSIS — R0602 Shortness of breath: Secondary | ICD-10-CM | POA: Diagnosis not present

## 2020-11-25 NOTE — Progress Notes (Signed)
Daily Session Note  Patient Details  Name: Catherine Hancock MRN: [id number] Date of Birth: 1961-09-28 Referring Provider:   Flowsheet Row PULMONARY REHAB OTHER RESP ORIENTATION from 11/19/2020 in [place]  Referring Provider Dr. Chase Hancock      Encounter Date: 11/25/2020  Check In:  Session Check In - 11/25/20 1045      Check-In   Supervising physician immediately available to respond to emergencies [facility] MD immediately available    Physician(s) Dr. Harl Hancock    Location [facility]    Staff Present Catherine Harsh, MS, Exercise Physiologist;Catherine Billingsley, RN    Virtual Visit No    Medication changes reported     No    Fall or balance concerns reported    No    Tobacco Cessation No Change    Warm-up and Cool-down Performed as group-led instruction    Resistance Training Performed No    VAD Patient? No    PAD/SET Patient? No      Pain Assessment   Currently in Pain? No/denies    Multiple Pain Sites No           Capillary Blood Glucose: No results found for this or any previous visit (from the past 24 hour(s)).    Social History   Tobacco Use  Smoking Status Never Smoker  Smokeless Tobacco Never Used    Goals Met:  Independence with exercise equipment Exercise tolerated well No report of cardiac concerns or symptoms Strength training completed today  Goals Unmet:  Not Applicable  Comments: check out 1145   Dr. Kathie Hancock is Medical Director for [facility] Pulmonary Re[facility].

## 2020-11-27 ENCOUNTER — Other Ambulatory Visit: Payer: Self-pay

## 2020-11-27 ENCOUNTER — Encounter (HOSPITAL_COMMUNITY)
Admission: RE | Admit: 2020-11-27 | Discharge: 2020-11-27 | Disposition: A | Discharge status: Home / Self Care | Payer: BC Managed Care – PPO | Source: Ambulatory Visit | Attending: Internal Medicine | Admitting: Internal Medicine

## 2020-11-27 DIAGNOSIS — R0602 Shortness of breath: Secondary | ICD-10-CM | POA: Diagnosis not present

## 2020-11-27 NOTE — Progress Notes (Signed)
Daily Session Note  Patient Details  Name: Catherine Hancock MRN: [id number] Date of Birth: 04/24/1961 Referring Provider:   Flowsheet Row PULMONARY REHAB OTHER RESP ORIENTATION from 11/19/2020 in [place]  Referring Provider Dr. Chase Caller      Encounter Date: 11/27/2020  Check In:  Session Check In - 11/27/20 1045      Check-In   Supervising physician immediately available to respond to emergencies [facility] MD immediately available    Physician(s) Dr. Harl Bowie    Location [facility]    Staff Present Geanie Cooley, RN;Wanda Rideout Audria Nine, MS, Exercise Physiologist    Virtual Visit No    Medication changes reported     No    Fall or balance concerns reported    No    Tobacco Cessation No Change    Warm-up and Cool-down Performed as group-led instruction    Resistance Training Performed No    VAD Patient? No    PAD/SET Patient? No      Pain Assessment   Currently in Pain? No/denies    Multiple Pain Sites No           Capillary Blood Glucose: No results found for this or any previous visit (from the past 24 hour(s)).    Social History   Tobacco Use  Smoking Status Never Smoker  Smokeless Tobacco Never Used    Goals Met:  Independence with exercise equipment Exercise tolerated well No report of cardiac concerns or symptoms Strength training completed today  Goals Unmet:  Not Applicable  Comments: check out 1145   [name] is Medical Director for [facility] Pulmonary Rehab.

## 2020-12-02 ENCOUNTER — Other Ambulatory Visit: Payer: Self-pay

## 2020-12-02 ENCOUNTER — Encounter (HOSPITAL_COMMUNITY)
Admission: RE | Admit: 2020-12-02 | Discharge: 2020-12-02 | Disposition: A | Discharge status: Home / Self Care | Payer: BC Managed Care – PPO | Source: Ambulatory Visit | Attending: Internal Medicine | Admitting: Internal Medicine

## 2020-12-02 DIAGNOSIS — R0602 Shortness of breath: Secondary | ICD-10-CM | POA: Diagnosis present

## 2020-12-02 NOTE — Progress Notes (Signed)
I have reviewed a Home Exercise Prescription with Catherine Hancock . Catherine Hancock is not currently exercising at home.  The patient was advised to walk 2 days a week for 20-30 minutes.  Catherine Hancock and I discussed how to progress their exercise prescription.  The patient stated that their goals were to lose about 30 lbs.  The patient stated that they understand the exercise prescription.  We reviewed exercise guidelines, target heart rate during exercise, RPE Scale, weather conditions, NTG use, endpoints for exercise, warmup and cool down.  Patient is encouraged to come to me with any questions. I will continue to follow up with the patient to assist them with progression and safety.

## 2020-12-02 NOTE — Progress Notes (Signed)
Daily Session Note  Patient Details  Name: Catherine Hancock MRN: [id number] Date of Birth: 19-Apr-1961 Referring Provider:   Flowsheet Row PULMONARY REHAB OTHER RESP ORIENTATION from 11/19/2020 in [place]  Referring Provider Dr. Chase Caller      Encounter Date: 12/02/2020  Check In:  Session Check In - 12/02/20 1045      Check-In   Supervising physician immediately available to respond to emergencies [facility] MD immediately available    Physician(s) Dr. Domenic Polite    Location [facility]    Staff Present Geanie Cooley, RN;Madison Audria Nine, MS, Exercise Physiologist;Psalms [place] Kris Mouton, MS, ACSM-CEP, Exercise Physiologist    Virtual Visit No    Medication changes reported     No    Fall or balance concerns reported    No    Tobacco Cessation No Change    Warm-up and Cool-down Performed as group-led instruction    Resistance Training Performed Yes    PAD/SET Patient? No      Pain Assessment   Currently in Pain? No/denies    Multiple Pain Sites No           Capillary Blood Glucose: No results found for this or any previous visit (from the past 24 hour(s)).    Social History   Tobacco Use  Smoking Status Never Smoker  Smokeless Tobacco Never Used    Goals Met:  Independence with exercise equipment Exercise tolerated well No report of cardiac concerns or symptoms Strength training completed today  Goals Unmet:  Not Applicable  Comments: checkout time is 1145   [name] is Medical Director for [facility] Pul[facility] Rehab.

## 2020-12-04 ENCOUNTER — Encounter (HOSPITAL_COMMUNITY)
Admission: RE | Admit: 2020-12-04 | Discharge: 2020-12-04 | Disposition: A | Discharge status: Home / Self Care | Payer: BC Managed Care – PPO | Source: Ambulatory Visit | Attending: Internal Medicine | Admitting: Internal Medicine

## 2020-12-04 ENCOUNTER — Other Ambulatory Visit: Payer: Self-pay

## 2020-12-04 DIAGNOSIS — R0602 Shortness of breath: Secondary | ICD-10-CM | POA: Diagnosis not present

## 2020-12-04 NOTE — Progress Notes (Signed)
Daily Session Note  Patient Details  Name: Catherine Hancock MRN: [id number] Date of Birth: 1960-12-22 Referring Provider:   Flowsheet Row PULMONARY REHAB OTHER RESP ORIENTATION from 11/19/2020 in [place]  Referring Provider Dr. Chase Caller      Encounter Date: 12/04/2020  Check In:  Session Check In - 12/04/20 1045      Check-In   Supervising physician immediately available to respond to emergencies [facility] MD immediately available    Physician(s) Dr. Domenic Polite    Location [facility]    Staff Present Aundra Dubin, RN, BSN;Madison Audria Nine, MS, Exercise Physiologist    Virtual Visit No    Medication changes reported     No    Fall or balance concerns reported    No    Tobacco Cessation No Change    Warm-up and Cool-down Performed as group-led instruction    Resistance Training Performed Yes    VAD Patient? No    PAD/SET Patient? No      Pain Assessment   Currently in Pain? No/denies    Multiple Pain Sites No           Capillary Blood Glucose: No results found for this or any previous visit (from the past 24 hour(s)).    Social History   Tobacco Use  Smoking Status Never Smoker  Smokeless Tobacco Never Used    Goals Met:  Proper associated with RPD/PD & O2 Sat Independence with exercise equipment Improved SOB with ADL's Using PLB without cueing & demonstrates good technique Exercise tolerated well No report of cardiac concerns or symptoms Strength training completed today  Goals Unmet:  Not Applicable  Comments: Check out 1145.   [name] is Medical Director for [facility] Pulmonary Rehab.

## 2020-12-09 ENCOUNTER — Other Ambulatory Visit: Payer: Self-pay

## 2020-12-09 ENCOUNTER — Encounter (HOSPITAL_COMMUNITY)
Admission: RE | Admit: 2020-12-09 | Discharge: 2020-12-09 | Disposition: A | Discharge status: Home / Self Care | Payer: BC Managed Care – PPO | Source: Ambulatory Visit | Attending: Internal Medicine | Admitting: Internal Medicine

## 2020-12-09 VITALS — Wt 159.0 lb

## 2020-12-09 DIAGNOSIS — R0602 Shortness of breath: Secondary | ICD-10-CM | POA: Diagnosis not present

## 2020-12-09 NOTE — Progress Notes (Signed)
Daily Session Note  Patient Details  Name: Catherine Hancock MRN: [id number] Date of Birth: November 08, 1960 Referring Provider:   Flowsheet Row PULMONARY REHAB OTHER RESP ORIENTATION from 11/19/2020 in [place]  Referring Provider Dr. Chase Caller      Encounter Date: 12/09/2020  Check In:  Session Check In - 12/09/20 1045      Check-In   Supervising physician immediately available to respond to emergencies [facility] MD immediately available    Physician(s) Dr. Harl Bowie    Location [facility]    Staff Present [name], MS, Exercise Physiologist;[place] Kris Mouton, MS, ACSM-CEP, Exercise Physiologist;Phyllis Billingsley, RN    Virtual Visit No    Medication changes reported     No    Fall or balance concerns reported    No    Tobacco Cessation No Change    Warm-up and Cool-down Performed as group-led instruction    Resistance Training Performed Yes    VAD Patient? No    PAD/SET Patient? No      Pain Assessment   Currently in Pain? No/denies    Multiple Pain Sites No           Capillary Blood Glucose: No results found for this or any previous visit (from the past 24 hour(s)).    Social History   Tobacco Use  Smoking Status Never Smoker  Smokeless Tobacco Never Used    Goals Met:  Independence with exercise equipment Exercise tolerated well No report of cardiac concerns or symptoms Strength training completed today  Goals Unmet:  Not Applicable  Comments: check out 1145   [name] is Medical Director for [facility] Pulmonary Rehab.

## 2020-12-10 ENCOUNTER — Ambulatory Visit (INDEPENDENT_AMBULATORY_CARE_PROVIDER_SITE_OTHER): Payer: BC Managed Care – PPO | Admitting: Internal Medicine

## 2020-12-10 ENCOUNTER — Other Ambulatory Visit: Payer: BC Managed Care – PPO

## 2020-12-10 ENCOUNTER — Encounter: Payer: Self-pay | Admitting: Internal Medicine

## 2020-12-10 ENCOUNTER — Other Ambulatory Visit: Payer: Self-pay

## 2020-12-10 VITALS — BP 118/84 | HR 80 | Temp 98.0°F | Ht 60.0 in | Wt 161.0 lb

## 2020-12-10 DIAGNOSIS — Z8616 Personal history of COVID-19: Secondary | ICD-10-CM

## 2020-12-10 DIAGNOSIS — R0609 Other forms of dyspnea: Secondary | ICD-10-CM | POA: Diagnosis not present

## 2020-12-10 DIAGNOSIS — U099 Post covid-19 condition, unspecified: Secondary | ICD-10-CM | POA: Diagnosis not present

## 2020-12-10 DIAGNOSIS — R5382 Chronic fatigue, unspecified: Secondary | ICD-10-CM | POA: Diagnosis not present

## 2020-12-10 LAB — SARS-COV-2 IGG: SARS-COV-2 IgG: 3.13

## 2020-12-10 NOTE — Patient Instructions (Addendum)
ICD-10-CM   1. History of COVID-19  Z86.16   2. 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 COVID-19 long hauler manifesting chronic dyspnea  R06.09    U09.9   4. COVID-19 long hauler manifesting chronic fatigue  R53.82    U09.9      Glad you are slowly improving but he still have residual symptoms but rehab is helping  Plan -Continue pulmonary rehabilitation -Continue 2 L of oxygen at night with his CPAP -Do high-resolution CT chest supine and prone sometime in July/August 2022 -Check Covid IgG -> to see if you have natural immediate innate immunity.  If this IgG is negative you might have to consider more strongly about taking a vaccine  Follow-up -Return in July/August 2022 for routine follow-up  -= Return earlier if needed

## 2020-12-10 NOTE — Addendum Note (Signed)
Addended by: Lorretta Harp on: 12/10/2020 11:00 AM   Modules accepted: Orders

## 2020-12-10 NOTE — Progress Notes (Signed)
Subjective:    Patient ID: Catherine Hancock, female    DOB: Jul 21, 1961, 60 y.o.   MRN: [id number]  PCP Tobe Sos, MD   HPI   IOV 10/08/2019  Chief Complaint  Patient presents with  . Consult    Self referral due to asthma. Pt stated she had the flu 2 years ago and since then she has been having problems with asthma, cough, SOB. Pt states the SOB can happen at any time.   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  Self-referred for shortness of breath and cough.  She tells me that she has had a lifelong history of asthma for which she is on Symbicort and Singulair on a scheduled basis.  Then in 2018 Easter she had influenza treated with Tamiflu as an outpatient in [facility] after family cluster.  Since then she has had cough that has persisted all along.  The cough is mild and persistent.  Never really improved.  Then in early 2020 she developed insidious onset of shortness of breath that has progressed.  She tells me she can get short of breath anytime.  At night she can wake up occasionally because of the cough but never because of shortness of breath.  There is no orthopnea proximal nocturnal dyspnea but in the daytime she perceives dyspnea for anything and randomly.  She says that when she walks definitely the shortness of breath is worse and relieved by rest there is no associated chest pain.  She has an upcoming echocardiogram.  Review of the outside notes indicate she has had cardiology work-up that has been negative.  Overall the shortness of breath is rated currently as moderate with the cough as mild and the wheezing is extremely mild.  Walking desaturation test today on room air shows pulse ox 100% on room air with a heart rate of 88/min.  After walking 3 laps her pulse ox was 98% with a heart rate of 107/min.  She walked at average pace and she was mildly dyspneic with this.  Review of  the chart indicates a history of right lower lobe lung nodule.  However there is no imaging for me to visualize.  She said rheumatoid factor test for unclear reasons for years ago and was normal  She has a history of allergies.  She says she has had a skin test many many years ago and this was positive for oak.  Otherwise details not known.  She is willing to have a full work-up at this point in time.   In terms of asthma control questionnaire she says she is waking up several times at night because of asthma.  When she wakes up she has moderate symptoms she is moderately limited in activities.  She is experiencing shortness of breath quite a lot but she is wheezing hardly any of the time.  Overall score average score would be 2.8 showing significant level of symptoms.    Results for KAYTE, BORCHARD (MRN [id number]) as of 10/08/2019 11:26  Ref. Range 03/12/2015 11:59  Anti Nuclear Antibody (ANA) Latest Ref Range: Negative  Negative  RA Latex Turbid. Latest Ref Range: 0.0 - 13.9 IU/mL 9.3   Results for TASCHA, CASARES (MRN [id number]) as of 10/08/2019 11:26  Ref. Range 01/13/2015 14:30 03/12/2015 11:59  CO2 Latest Ref Range: 18 - 29 mmol/L 19 22   OV 01/14/2020  Subjective:  Patient ID: Catherine Hancock, female , DOB: 20-Apr-1961 , age 28 y.o. , MRN: [id number] , ADDRESS: [place] [id number]   01/14/2020 -   Chief Complaint  Patient presents with  . Follow-up    Pt states she has been doing okay since last visit. States she still will become SOB at times and also states she will still occ cough.     HPI [name] 60 y.o. -here to review results of below work-up.  In the interim she says her symptoms persist but the cough and the shortness of breath.  Without any change.  I she again insisted the shortness of breath happens randomly and she has to take a deep breath.  She also has left inframammary tightness that is chronic.  She had a recent echocardiogram that is normal.   Allergy work-up and autoimmune is normal.  Pulmonary function test not done yet because of the pandemic and disruptions in the PFT lab.  CT scan of the chest personally visualized and interpreted is essentially normal except for thickened airways of chronic bronchitis.  She is compliant with the Symbicort.  She takes albuterol as needed.  She has been taking Symbicort for a few years.  She also has a cough but not resolved.  She has not seen ENT.  She used to see Dr. Janace Hoard many years ago.  She is not had pulmonary stress test.   Allergy and autoimmuned  Results for SHEMEKA, WARDLE (MRN [id number]) as of 01/14/2020 10:56  Ref. Range 03/12/2015 11:59 10/08/2019 12:01  Sheep Sorrel IgE Latest Units: kU/L  <0.10  Pecan/Hickory Tree IgE Latest Units: kU/L  <0.10  IgE (Immunoglobulin E), Serum Latest Ref Range: <OR=114 kU/L  35  Allergen, D pternoyssinus,d7 Latest Units: kU/L  <0.10  Cat Dander Latest Units: kU/L  <0.10  Dog Dander Latest Units: kU/L  <0.10  [place] Grass Latest Units: kU/L  <0.10  Johnson Grass Latest Units: kU/L  <0.10  Timothy Grass Latest Units: kU/L  <0.10  Cockroach Latest Units: kU/L  <0.10  Aspergillus fumigatus, m3 Latest Units: kU/L  <0.10  Allergen, Comm Silver Wendee Copp, t9 Latest Units: kU/L  <0.10  Allergen, Cottonwood, t14 Latest Units: kU/L  <0.10  Elm IgE Latest Units: kU/L  <0.10  Allergen, Mulberry, t76 Latest Units: kU/L  <0.10  Allergen, Oak,t7 Latest Units: kU/L  <0.10  COMMON RAGWEED (SHORT) (W1) IGE Latest Units: kU/L  <0.10  Allergen, Mouse Urine Protein, e78 Latest Units: kU/L  <0.10  D. farinae Latest Units: kU/L  <0.10  Allergen, Cedar tree, t12 Latest Units: kU/L  <0.10  Box Elder IgE Latest Units: kU/L  <0.10  Rough Pigweed  IgE Latest Units: kU/L  <0.10  Anti Nuclear Antibody (ANA) Latest Ref Range: Negative  Negative   RA Latex Turbid. Latest Ref Range: 0.0 - 13.9 IU/mL 9.3      ECHO march 2021  IMPRESSIONS    1. Left ventricular ejection  fraction, by estimation, is 60 to 65%. The  left ventricle has normal function. The left ventricle has no regional  wall motion abnormalities. Left ventricular diastolic parameters were  normal.  2. Right ventricular systolic function is normal. The right ventricular  size is normal.  3. The mitral valve is normal in structure. No evidence of mitral valve  regurgitation. No evidence of mitral stenosis.  4. The aortic valve is tricuspid. Aortic valve regurgitation is not  visualized. No aortic stenosis is present.  5. The inferior  vena cava is normal in size with greater than 50%  respiratory variability, suggesting right atrial pressure of 3 mmHg.   Comparison(s): No prior Echocardiogram. Stress echo 04/04/17 EF 55%.   Conclusion(s)/Recommendation(s): Normal biventricular function without  evidence of hemodynamically significant valvular heart disease.      CT chest high resolution  IMPRESSION: 1. No evidence of interstitial lung disease. 2. Nonspecific mild diffuse bronchial wall thickening, as can be seen with reactive airways disease or chronic bronchitis. 3. Small hiatal hernia. 4. Cholelithiasis.   Electronically Signed   By: Ilona Sorrel M.D.   On: 10/30/2019 16:08  ROS - per HPI   09/19/2020  - Visit   60 year old female never smoker followed in our office for dyspnea on exertion, asthma, history of COVID-19 infection.  She is followed by Dr. Chase Caller.  She is presenting today as a follow-up visit after completing pulmonary function testing.  She was last seen in our office in October/2021.  She was seen by SG NP.  She was encouraged to remain on Symbicort at that time, continue Singulair, continue CPAP therapy.  Patient presenting to office today after completing pulmonary function testing those results are listed below:  09/19/2020-pulmonary function test-FVC 2.64 (92% addicted), TLC 3.28 (73% percent predicted), postbronchodilator ratio 87,  postbronchodilator FEV1 2.21 (99% predicted), DLCO 14.02 (79% addicted)  At last evaluation by Dr. Chase Caller she was referred to ENT for chronic cough irritable larynx syndrome as well as for a cardiopulmonary exercise test back in March/2021.  Patient contracted [id number] in July/2021.  He did require hospitalization.  She was unvaccinated COVID-19.  She did not receive the monoclonal antibody infusion.  She did not require mechanical ventilator.  She was treated with remdesivir as well as steroids.  She feels that her breathing symptoms have been persistent since then.  A follow-up CT did show COVID-19 pneumonia inflammatory changes.  Patient reports adherence to Symbicort 80.  She also reports the need to use her rescue inhaler 4 times a day.  She denies that this is for shortness of breath or cough or wheezing.  She reports that it is a sensation in her chest and the albuterol treats this.  We will discuss this today.  She also occasionally uses her albuterol nebulized medications.  Patient walked in office today was able to complete 3 laps without any oxygen desaturations on room air.  She continues to be maintained on 2 L of O2 at night via her CPAP.      OV 12/10/2020  Subjective:  Patient ID: Catherine Hancock, female , DOB: 30-Mar-1961 , age 62 y.o. , MRN: [id number] , ADDRESS: [address] [address] [place] [address] PCP Tobe Sos, MD Patient Care Team: Tobe Sos, MD as PCP - General (Internal Medicine) Buford Dresser, MD as PCP - Cardiology (Cardiology) Sueanne Margarita, MD as PCP - Sleep Medicine (Cardiology) Lavonna Monarch, MD as Consulting Physician (Dermatology)  This Provider for this visit: Treatment Team:  Attending Provider: Brand Males, MD    12/10/2020 -   Chief Complaint  Patient presents with  . Follow-up    Occasional dry cough, Covid 11/19/2020. 2 L of oxygen at night DME- [employer] in [place], [place]     HPI LAAIBAH WARTMAN  60 y.o. - returns for follow-up.  Last seen in the spring 2021 by myself at the time for idiopathic dyspnea.  Her CT chest in December 2020 was normal.  She then had COVID-19 in the summer 2021.  This resulted in hospitalization she was unvaccinated.  She was on oxygen.  At this point in time she is recovered but complaining of significant Covid long-haul symptoms.  I asked her to rank her symptoms and she said fatigue and tiredness is #1 followed by some dyspnea on exertion and followed by balance issues and followed by some nonspecific pain in her chest.  She did not have any DVTs or PE at the time of Covid.  She is undergoing pulmonary rehabilitation and this is improving.  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  However she is not desaturating at rehabilitation according to history.  She started this a few weeks ago and it is helping her.  She says she had a cardiac evaluation and this was normal.  I reviewed the chart and she saw Al Pimple October 01, 2020.   She is just using her oxygen at night.   Of note she remains unvaccinated against COVID-19.  PFT  PFT Results Latest Ref Rng & Units 09/19/2020  FVC-Pre L 2.64  FVC-Predicted Pre % 92  FVC-Post L 2.56  FVC-Predicted Post % 89  Pre FEV1/FVC % % 82  Post FEV1/FCV % % 87  FEV1-Pre L 2.16  FEV1-Predicted Pre % 97  FEV1-Post L 2.21  DLCO uncorrected ml/min/mmHg 14.02  DLCO UNC% % 79  DLCO corrected ml/min/mmHg 14.02  DLCO COR %Predicted % 79  DLVA Predicted % 83  TLC L 3.28  TLC % Predicted % 73  RV % Predicted % 36      has a past medical history of Asthma, Asthma, Chest pain, Chronic back pain, CTS (carpal tunnel syndrome), CTS (carpal tunnel syndrome), Depression (05/28/2020), Hypothyroidism, IBS (irritable bowel syndrome), Internal hemorrhoids, Migraines, OSA on CPAP (11/13/2017), and Pulmonary nodule, right.   reports that she has never smoked. She has never used  smokeless tobacco.  Past Surgical History:  Procedure Laterality Date  . BREAST EXCISIONAL BIOPSY Right    benign  . breast nodule surg     right breast nodule and left lymph node removed in 1994  . COLONOSCOPY  06/09/2012   Procedure: COLONOSCOPY;  Surgeon: Lafayette Dragon, MD;  Location: [facility];  Service: Endoscopy;  Laterality: N/A;  . EXCISION OF BREAST BIOPSY Left    2 benign lymph nodes removed  . HEMORRHOID SURGERY     2007  . LYMPH NODE BIOPSY     left breast  . NASAL SINUS SURGERY     1992  . TONSILLECTOMY     1981  . TOTAL VAGINAL HYSTERECTOMY      Allergies  Allergen Reactions  . Gadolinium Derivatives Other (See Comments)    Severe pain, joints locked up  . Latex Other (See Comments) and Shortness Of Breath    Asthma attack   . Other Shortness Of Breath and Other (See Comments)    Severe pain, joints locked up gadolinium contrast-causes joints to lock up, severe pain Severe pain, joints locked up gadolinium contrast-causes joints to lock up, severe pain  Asthma attack  . Fluticasone-Salmeterol Other (See Comments)    Blurry vision Blurry vision Blurry vision Blurry vision Blurry vision  . Contrast Media [Iodinated Diagnostic Agents]     gadolinium contrast-causes joints to lock up, severe pain  . Doxycycline Itching and Rash  . Sulfamethoxazole-Trimethoprim Diarrhea and Nausea And Vomiting    REACTION: GI upset Other reaction(s): OTHER REACTION: GI upset Other reaction(s): OTHER Other reaction(s): OTHER  Immunization History  Administered Date(s) Administered  . Influenza Whole 07/25/2007  . Influenza,inj,Quad PF,6+ Mos 08/13/2016, 09/12/2019  . Influenza-Unspecified 07/29/2017, 09/02/2020  . Pneumococcal Conjugate-13 10/11/2013, 10/11/2013  . Pneumococcal Polysaccharide-23 06/14/2017  . Tdap 05/13/2011, 08/13/2016  . Zoster 10/30/2012    Family History  Problem Relation Age of Onset  . Colon cancer Maternal Aunt        x 4  . Colon  cancer Maternal Uncle   . Colon cancer Maternal Grandmother   . Heart disease Maternal Grandfather   . Breast cancer Maternal Aunt   . Ovarian cancer Maternal Aunt   . Lymphoma Mother   . Prostate cancer Father   . COPD Father   . Heart failure Father   . Hypertension Father   . Heart disease Paternal Uncle        x 4  . Heart disease Paternal Grandfather   . Mitral valve prolapse Sister      Current Outpatient Medications:  .  albuterol (ACCUNEB) 0.63 MG/3ML nebulizer solution, Take by nebulization 3 (three) times daily., Disp: , Rfl:  .  albuterol (VENTOLIN HFA) 108 (90 Base) MCG/ACT inhaler, Inhale 2 puffs into the lungs every 6 (six) hours., Disp: 8 g, Rfl: 0 .  ALPRAZolam (XANAX) 0.5 MG tablet, Take 0.25 mg by mouth as directed. PRN, Disp: , Rfl:  .  aspirin EC 81 MG tablet, Take 1 tablet (81 mg total) by mouth daily., Disp: , Rfl:  .  azelastine (ASTELIN) 0.1 % nasal spray, Place 2 sprays into the nose 4 (four) times daily as needed., Disp: , Rfl:  .  BIOTIN PO, Take 1 tablet by mouth as directed., Disp: , Rfl:  .  budesonide-formoterol (SYMBICORT) 160-4.5 MCG/ACT inhaler, Inhale 2 puffs into the lungs 2 (two) times daily., Disp: 1 each, Rfl: 6 .  budesonide-formoterol (SYMBICORT) 160-4.5 MCG/ACT inhaler, Inhale 2 puffs into the lungs 2 (two) times daily., Disp: , Rfl:  .  Calcium Carb-Cholecalciferol (CALCIUM 600+D3) 600-800 MG-UNIT TABS, Take 1 tablet by mouth daily., Disp: , Rfl:  .  Cholecalciferol (VITAMIN D) 50 MCG (2000 UT) CAPS, Take 1 capsule by mouth daily with breakfast. Takes in addition to weekly ergocalciferol, Disp: , Rfl:  .  Cholecalciferol (VITAMIN D3 PO), Take 25,000 Units by mouth once a week., Disp: , Rfl:  .  citalopram (CELEXA) 20 MG tablet, Take 20 mg by mouth daily. , Disp: , Rfl:  .  cyclobenzaprine (FLEXERIL) 10 MG tablet, TAKE 1 TABLET THREE TIMES A DAY AS NEEDED FOR MUSCLE SPASMS (Patient taking differently: Take 10 mg by mouth at bedtime.), Disp: 90  tablet, Rfl: 0 .  diclofenac (VOLTAREN) 50 MG EC tablet, Take 1 tablet by mouth 2 (two) times daily., Disp: , Rfl:  .  DONNATAL 16.2 MG tablet, Take 16.2 mg by mouth daily as needed (Stomach pain). , Disp: , Rfl:  .  EPINEPHrine 0.3 mg/0.3 mL IJ SOAJ injection, Inject 0.3 mg into the muscle as needed for anaphylaxis. , Disp: , Rfl:  .  HYDROcodone-acetaminophen (NORCO/VICODIN) 5-325 MG tablet, Take 1 tablet by mouth every 6 (six) hours as needed for moderate pain., Disp: , Rfl:  .  ibuprofen (ADVIL,MOTRIN) 800 MG tablet, Take 800 mg by mouth every 8 (eight) hours as needed (pain)., Disp: , Rfl:  .  levocetirizine (XYZAL) 5 MG tablet, Take 1 tablet by mouth daily., Disp: , Rfl:  .  levothyroxine (SYNTHROID) 50 MCG tablet, Take 50 mcg by mouth daily. , Disp: , Rfl:  .  lubiprostone (AMITIZA) 24 MCG capsule, Take 1 capsule by mouth 2 (two) times daily., Disp: , Rfl:  .  meclizine (ANTIVERT) 25 MG tablet, Take 1 tablet by mouth 3 (three) times daily as needed., Disp: , Rfl:  .  montelukast (SINGULAIR) 10 MG tablet, Take 10 mg by mouth at bedtime., Disp: , Rfl:  .  polyethylene glycol powder (GLYCOLAX/MIRALAX) powder, Take 17 g by mouth as needed for mild constipation. , Disp: , Rfl:  .  predniSONE (DELTASONE) 10 MG tablet, 4 x 2 days, 3 x 2 days, 2 x 2 days, 1 x 2 days, then stop, Disp: 20 tablet, Rfl: 0 .  Prenatal Vit-Fe Fumarate-FA (M-NATAL PLUS) 27-1 MG TABS, Take 1 tablet by mouth at bedtime., Disp: , Rfl:  .  topiramate (TOPAMAX) 50 MG tablet, Take 50-100 mg by mouth See admin instructions. 50mg  in the morning and 100mg  by mouth at night, Disp: , Rfl:  .  TRULANCE 3 MG TABS, Take 1 tablet by mouth daily., Disp: , Rfl:  .  Vitamin D, Ergocalciferol, (DRISDOL) 1.25 MG (50000 UNIT) CAPS capsule, Take 50,000 Units by mouth once a week., Disp: , Rfl:  .  zinc gluconate 50 MG tablet, Take 50 mg by mouth daily., Disp: , Rfl:  .  zolpidem (AMBIEN) 10 MG tablet, Take 0.5 tablets by mouth at bedtime as  needed for sleep. , Disp: , Rfl:  .  esomeprazole (NEXIUM) 40 MG capsule, Take 40 mg by mouth 2 (two) times daily before a meal. , Disp: , Rfl:  .  nitroGLYCERIN (NITROSTAT) 0.4 MG SL tablet, Place 1 tablet (0.4 mg total) under the tongue every 5 (five) minutes as needed for chest pain., Disp: 25 tablet, Rfl: 12      Objective:   Vitals:   12/10/20 1022  BP: 118/84  Pulse: 80  Temp: 98 F (36.7 C)  SpO2: 98%  Weight: 161 lb (73 kg)  Height: 5' (1.524 m)    Estimated body mass index is 31.44 kg/m as calculated from the following:   Height as of this encounter: 5' (1.524 m).   Weight as of this encounter: 161 lb (73 kg).  @WEIGHTCHANGE @  Filed Weights   12/10/20 1022  Weight: 161 lb (73 kg)     Physical Exam General: No distress. obese Neuro: Alert and Oriented x 3. GCS 15. Speech normal Psych: Pleasant Resp:  Barrel Chest - no.  Wheeze - no, Crackles - no, No overt respiratory distress CVS: Normal heart sounds. Murmurs - no Ext: Stigmata of Connective Tissue Disease - no HEENT: Normal upper airway. PEERL +. No post nasal drip        Assessment:       ICD-10-CM   1. History of COVID-19  Z86.16 CT Chest High Resolution  2. COVID-19 long hauler  U09.9 CT Chest High Resolution  3. COVID-19 long hauler manifesting chronic dyspnea  R06.09 CT Chest High Resolution   U09.9   4. COVID-19 long hauler manifesting chronic fatigue  R53.82 CT Chest High Resolution   U09.9        Plan:     Patient Instructions     ICD-10-CM   1. History of COVID-19  Z86.16   2. 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 COVID-19 long hauler manifesting chronic dyspnea  R06.09    U09.9   4. COVID-19 long hauler manifesting chronic fatigue  R53.82    U09.9      Glad you are slowly improving but he  still have residual symptoms but rehab is helping  Plan -Continue pulmonary rehabilitation -Continue 2 L of oxygen at night with his CPAP -Do high-resolution CT chest supine and prone sometime  in July/August 2022 -Check Covid IgG -> to see if you have natural immediate innate immunity.  If this IgG is negative you might have to consider more strongly about taking a vaccine  Follow-up -Return in July/August 2022 for routine follow-up  -= Return earlier if needed     SIGNATURE    Dr. Brand Males, M.D., F.C.C.P,  Pulmonary and Critical Care Medicine Staff Physician, [place] Director - Interstitial Lung Disease  Program  [facility] [place], [place], [address]  Pager: [phone], If no answer or between  15:00h - 7:00h: call [phone] Telephone: [phone]  10:55 AM 12/10/2020

## 2020-12-11 ENCOUNTER — Encounter (HOSPITAL_COMMUNITY)
Admission: RE | Admit: 2020-12-11 | Discharge: 2020-12-11 | Disposition: A | Discharge status: Home / Self Care | Payer: BC Managed Care – PPO | Source: Ambulatory Visit | Attending: Internal Medicine | Admitting: Internal Medicine

## 2020-12-11 DIAGNOSIS — R0602 Shortness of breath: Secondary | ICD-10-CM | POA: Diagnosis not present

## 2020-12-11 NOTE — Progress Notes (Signed)
Daily Session Note  Patient Details  Name: Catherine Hancock MRN: [id number] Date of Birth: 1961/04/19 Referring Provider:   Flowsheet Row PULMONARY REHAB OTHER RESP ORIENTATION from 11/19/2020 in [place]  Referring Provider Dr. Chase Caller      Encounter Date: 12/11/2020  Check In:  Session Check In - 12/11/20 1045      Check-In   Supervising physician immediately available to respond to emergencies [facility] MD immediately available    Physician(s) Dr. Harl Bowie    Location [facility]    Staff Present Aundra Dubin, RN, BSN;[place] Audria Nine, MS, Exercise Physiologist    Virtual Visit No    Medication changes reported     No    Fall or balance concerns reported    No    Tobacco Cessation No Change    Warm-up and Cool-down Performed as group-led instruction    Resistance Training Performed Yes    VAD Patient? No    PAD/SET Patient? No      Pain Assessment   Currently in Pain? No/denies    Multiple Pain Sites No           Capillary Blood Glucose: Results for orders placed or performed in visit on 12/10/20 (from the past 24 hour(s))  SARS-COV-2 IgG     Status: None   Collection Time: 12/10/20  2:05 PM  Result Value Ref Range   SARS-COV-2 IgG 3.13 Reactive Non-Reactive      Social History   Tobacco Use  Smoking Status Never Smoker  Smokeless Tobacco Never Used    Goals Met:  Proper associated with RPD/PD & O2 Sat Independence with exercise equipment Improved SOB with ADL's Using PLB without cueing & demonstrates good technique Exercise tolerated well No report of cardiac concerns or symptoms Strength training completed today  Goals Unmet:  Not Applicable  Comments: Check 1145.   [name] is Medical Director for [facility].

## 2020-12-11 NOTE — Progress Notes (Signed)
Pulmonary Individual Treatment Plan  Patient Details  Name: Catherine Hancock MRN: [id number] Date of Birth: [birth year] Referring Provider:   Flowsheet Row PULMONARY REHAB OTHER RESP ORIENTATION from 11/19/2020 in [facility]  Referring Provider Dr. Chase Caller      Initial Encounter Date:  Flowsheet Row PULMONARY REHAB OTHER RESP ORIENTATION from 11/19/2020 in [place]  Date 11/19/20      Visit Diagnosis: Shortness of breath  Patient's Home Medications on Admission:   Current Outpatient Medications:  .  albuterol (ACCUNEB) 0.63 MG/3ML nebulizer solution, Take by nebulization 3 (three) times daily., Disp: , Rfl:  .  albuterol (VENTOLIN HFA) 108 (90 Base) MCG/ACT inhaler, Inhale 2 puffs into the lungs every 6 (six) hours., Disp: 8 g, Rfl: 0 .  ALPRAZolam (XANAX) 0.5 MG tablet, Take 0.25 mg by mouth as directed. PRN, Disp: , Rfl:  .  aspirin EC 81 MG tablet, Take 1 tablet (81 mg total) by mouth daily., Disp: , Rfl:  .  azelastine (ASTELIN) 0.1 % nasal spray, Place 2 sprays into the nose 4 (four) times daily as needed., Disp: , Rfl:  .  BIOTIN PO, Take 1 tablet by mouth as directed., Disp: , Rfl:  .  budesonide-formoterol (SYMBICORT) 160-4.5 MCG/ACT inhaler, Inhale 2 puffs into the lungs 2 (two) times daily., Disp: 1 each, Rfl: 6 .  budesonide-formoterol (SYMBICORT) 160-4.5 MCG/ACT inhaler, Inhale 2 puffs into the lungs 2 (two) times daily., Disp: , Rfl:  .  Calcium Carb-Cholecalciferol (CALCIUM 600+D3) 600-800 MG-UNIT TABS, Take 1 tablet by mouth daily., Disp: , Rfl:  .  Cholecalciferol (VITAMIN D) 50 MCG (2000 UT) CAPS, Take 1 capsule by mouth daily with breakfast. Takes in addition to weekly ergocalciferol, Disp: , Rfl:  .  Cholecalciferol (VITAMIN D3 PO), Take 25,000 Units by mouth once a week., Disp: , Rfl:  .  citalopram (CELEXA) 20 MG tablet, Take 20 mg by mouth daily. , Disp: , Rfl:  .  cyclobenzaprine (FLEXERIL) 10 MG tablet, TAKE 1  TABLET THREE TIMES A DAY AS NEEDED FOR MUSCLE SPASMS (Patient taking differently: Take 10 mg by mouth at bedtime.), Disp: 90 tablet, Rfl: 0 .  diclofenac (VOLTAREN) 50 MG EC tablet, Take 1 tablet by mouth 2 (two) times daily., Disp: , Rfl:  .  DONNATAL 16.2 MG tablet, Take 16.2 mg by mouth daily as needed (Stomach pain). , Disp: , Rfl:  .  EPINEPHrine 0.3 mg/0.3 mL IJ SOAJ injection, Inject 0.3 mg into the muscle as needed for anaphylaxis. , Disp: , Rfl:  .  esomeprazole (NEXIUM) 40 MG capsule, Take 40 mg by mouth 2 (two) times daily before a meal. , Disp: , Rfl:  .  HYDROcodone-acetaminophen (NORCO/VICODIN) 5-325 MG tablet, Take 1 tablet by mouth every 6 (six) hours as needed for moderate pain., Disp: , Rfl:  .  ibuprofen (ADVIL,MOTRIN) 800 MG tablet, Take 800 mg by mouth every 8 (eight) hours as needed (pain)., Disp: , Rfl:  .  levocetirizine (XYZAL) 5 MG tablet, Take 1 tablet by mouth daily., Disp: , Rfl:  .  levothyroxine (SYNTHROID) 50 MCG tablet, Take 50 mcg by mouth daily. , Disp: , Rfl:  .  lubiprostone (AMITIZA) 24 MCG capsule, Take 1 capsule by mouth 2 (two) times daily., Disp: , Rfl:  .  meclizine (ANTIVERT) 25 MG tablet, Take 1 tablet by mouth 3 (three) times daily as needed., Disp: , Rfl:  .  montelukast (SINGULAIR) 10 MG tablet, Take 10 mg by mouth at  bedtime., Disp: , Rfl:  .  nitroGLYCERIN (NITROSTAT) 0.4 MG SL tablet, Place 1 tablet (0.4 mg total) under the tongue every 5 (five) minutes as needed for chest pain., Disp: 25 tablet, Rfl: 12 .  polyethylene glycol powder (GLYCOLAX/MIRALAX) powder, Take 17 g by mouth as needed for mild constipation. , Disp: , Rfl:  .  predniSONE (DELTASONE) 10 MG tablet, 4 x 2 days, 3 x 2 days, 2 x 2 days, 1 x 2 days, then stop, Disp: 20 tablet, Rfl: 0 .  Prenatal Vit-Fe Fumarate-FA (M-NATAL PLUS) 27-1 MG TABS, Take 1 tablet by mouth at bedtime., Disp: , Rfl:  .  topiramate (TOPAMAX) 50 MG tablet, Take 50-100 mg by mouth See admin instructions. 50mg  in  the morning and 100mg  by mouth at night, Disp: , Rfl:  .  TRULANCE 3 MG TABS, Take 1 tablet by mouth daily., Disp: , Rfl:  .  Vitamin D, Ergocalciferol, (DRISDOL) 1.25 MG (50000 UNIT) CAPS capsule, Take 50,000 Units by mouth once a week., Disp: , Rfl:  .  zinc gluconate 50 MG tablet, Take 50 mg by mouth daily., Disp: , Rfl:  .  zolpidem (AMBIEN) 10 MG tablet, Take 0.5 tablets by mouth at bedtime as needed for sleep. , Disp: , Rfl:   Past Medical History: Past Medical History:  Diagnosis Date  . Asthma   . Asthma   . Chest pain    LHC 7/08: EF 60%, normal coronaries; ETT-Echo 6/11: EF 55-60%, no ischemia  . Chronic back pain   . CTS (carpal tunnel syndrome)    bil  . CTS (carpal tunnel syndrome)   . Depression 05/28/2020  . Hypothyroidism   . IBS (irritable bowel syndrome)   . Internal hemorrhoids   . Migraines   . OSA on CPAP 11/13/2017   Mild with AHI 13/hr   . Pulmonary nodule, right     Tobacco Use: Social History   Tobacco Use  Smoking Status Never Smoker  Smokeless Tobacco Never Used    Labs: Recent Review Scientist, physiological    Labs for ITP Cardiac and Pulmonary Rehab Latest Ref Rng & Units 05/28/2020   Trlycerides <150 mg/dL 123      Capillary Blood Glucose: No results found for: GLUCAP   Pulmonary Assessment Scores:  Pulmonary Assessment Scores    Row Name 11/19/20 1321         ADL UCSD   ADL Phase Entry     SOB Score total 59           CAT Score   CAT Score 22           mMRC Score   mMRC Score 2           UCSD: Self-administered rating of dyspnea associated with activities of daily living (ADLs) 6-point scale (0 = "not at all" to 5 = "maximal or unable to do because of breathlessness")  Scoring Scores range from 0 to 120.  Minimally important difference is 5 units  CAT: CAT can identify the health impairment of COPD patients and is better correlated with disease progression.  CAT has a scoring range of zero to 40. The CAT score is classified  into four groups of low (less than 10), medium (10 - 20), high (21-30) and very high (31-40) based on the impact level of disease on health status. A CAT score over 10 suggests significant symptoms.  A worsening CAT score could be explained by an exacerbation, poor medication adherence, poor inhaler  technique, or progression of COPD or comorbid conditions.  CAT MCID is 2 points  mMRC: mMRC (Modified Medical Research Council) Dyspnea Scale is used to assess the degree of baseline functional disability in patients of respiratory disease due to dyspnea. No minimal important difference is established. A decrease in score of 1 point or greater is considered a positive change.   Pulmonary Function Assessment:  Pulmonary Function Assessment - 11/19/20 1310      Breath   Shortness of Breath Yes;Limiting activity           Exercise Target Goals: Exercise Program Goal: Individual exercise prescription set using results from initial 6 min walk test and THRR while considering  patient's activity barriers and safety.   Exercise Prescription Goal: Initial exercise prescription builds to 30-45 minutes a day of aerobic activity, 2-3 days per week.  Home exercise guidelines will be given to patient during program as part of exercise prescription that the participant will acknowledge.  Activity Barriers & Risk Stratification:  Activity Barriers & Cardiac Risk Stratification - 11/19/20 1311      Activity Barriers & Cardiac Risk Stratification   Activity Barriers Deconditioning;Shortness of Breath;Chest Pain/Angina    Cardiac Risk Stratification Low           6 Minute Walk:  6 Minute Walk    Row Name 11/19/20 1433         6 Minute Walk   Phase Initial     Distance 1400 feet     Walk Time 6 minutes     # of Rest Breaks 0     MPH 3.2     METS 3.92     RPE 11     Perceived Dyspnea  11     VO2 Peak 13.72     Symptoms No     Resting HR 75 bpm     Resting BP 112/72     Resting Oxygen  Saturation  96 %     Exercise Oxygen Saturation  during 6 min walk 96 %     Max Ex. HR 102 bpm     Max Ex. BP 130/78     2 Minute Post BP 120/80           Interval HR   1 Minute HR 94     2 Minute HR 98     3 Minute HR 101     4 Minute HR 101     5 Minute HR 102     6 Minute HR 101     2 Minute Post HR 86     Interval Heart Rate? Yes           Interval Oxygen   Interval Oxygen? Yes     Baseline Oxygen Saturation % 96 %     1 Minute Oxygen Saturation % 96 %     1 Minute Liters of Oxygen 0 L     2 Minute Oxygen Saturation % 97 %     2 Minute Liters of Oxygen 0 L     3 Minute Oxygen Saturation % 97 %     3 Minute Liters of Oxygen 0 L     4 Minute Oxygen Saturation % 97 %     4 Minute Liters of Oxygen 0 L     5 Minute Oxygen Saturation % 96 %     5 Minute Liters of Oxygen 0 L     6 Minute Oxygen Saturation % 96 %  6 Minute Liters of Oxygen 0 L     2 Minute Post Oxygen Saturation % 98 %     2 Minute Post Liters of Oxygen 0 L            Oxygen Initial Assessment:  Oxygen Initial Assessment - 11/19/20 1444      Initial 6 min Walk   Oxygen Used None      Program Oxygen Prescription   Program Oxygen Prescription None   she has been using 2 L PRN since she got out of the hospital in July of 2020 but did not require it during her walk test. We will start her on RA and monitor her levels.     Intervention   Short Term Goals To learn and exhibit compliance with exercise, home and travel O2 prescription;To learn and understand importance of monitoring SPO2 with pulse oximeter and demonstrate accurate use of the pulse oximeter.;To learn and understand importance of maintaining oxygen saturations>88%;To learn and demonstrate proper pursed lip breathing techniques or other breathing techniques.;To learn and demonstrate proper use of respiratory medications    Long  Term Goals Exhibits compliance with exercise, home and travel O2 prescription;Maintenance of O2  saturations>88%;Verbalizes importance of monitoring SPO2 with pulse oximeter and return demonstration;Compliance with respiratory medication;Demonstrates proper use of MDI's;Exhibits proper breathing techniques, such as pursed lip breathing or other method taught during program session           Oxygen Re-Evaluation:  Oxygen Re-Evaluation    Row Name 12/09/20 1230             Program Oxygen Prescription   Program Oxygen Prescription None               Home Oxygen   Home Oxygen Device Home Concentrator;E-Tanks       Sleep Oxygen Prescription CPAP       Liters per minute 6       Home Exercise Oxygen Prescription Continuous       Liters per minute 2       Home Resting Oxygen Prescription None       Compliance with Home Oxygen Use Yes               Goals/Expected Outcomes   Short Term Goals To learn and exhibit compliance with exercise, home and travel O2 prescription;To learn and understand importance of monitoring SPO2 with pulse oximeter and demonstrate accurate use of the pulse oximeter.;To learn and understand importance of maintaining oxygen saturations>88%;To learn and demonstrate proper pursed lip breathing techniques or other breathing techniques.;To learn and demonstrate proper use of respiratory medications       Long  Term Goals Exhibits compliance with exercise, home and travel O2 prescription;Maintenance of O2 saturations>88%;Verbalizes importance of monitoring SPO2 with pulse oximeter and return demonstration;Compliance with respiratory medication;Demonstrates proper use of MDI's;Exhibits proper breathing techniques, such as pursed lip breathing or other method taught during program session              Oxygen Discharge (Final Oxygen Re-Evaluation):  Oxygen Re-Evaluation - 12/09/20 1230      Program Oxygen Prescription   Program Oxygen Prescription None      Home Oxygen   Home Oxygen Device Home Concentrator;E-Tanks    Sleep Oxygen Prescription CPAP    Liters  per minute 6    Home Exercise Oxygen Prescription Continuous    Liters per minute 2    Home Resting Oxygen Prescription None    Compliance with Home Oxygen Use  Yes      Goals/Expected Outcomes   Short Term Goals To learn and exhibit compliance with exercise, home and travel O2 prescription;To learn and understand importance of monitoring SPO2 with pulse oximeter and demonstrate accurate use of the pulse oximeter.;To learn and understand importance of maintaining oxygen saturations>88%;To learn and demonstrate proper pursed lip breathing techniques or other breathing techniques.;To learn and demonstrate proper use of respiratory medications    Long  Term Goals Exhibits compliance with exercise, home and travel O2 prescription;Maintenance of O2 saturations>88%;Verbalizes importance of monitoring SPO2 with pulse oximeter and return demonstration;Compliance with respiratory medication;Demonstrates proper use of MDI's;Exhibits proper breathing techniques, such as pursed lip breathing or other method taught during program session           Initial Exercise Prescription:  Initial Exercise Prescription - 11/19/20 1400      Date of Initial Exercise RX and Referring Provider   Date 11/19/20    Referring Provider Dr. Chase Caller    Expected Discharge Date 03/19/21      NuStep   Level 1    SPM 80    Minutes 22      Arm Ergometer   Level 1    RPM 60    Minutes 22      Intensity   THRR 40-80% of Max Heartrate 64-129    Ratings of Perceived Exertion 11-13    Perceived Dyspnea 0-4      Progression   Progression Continue to progress workloads to maintain intensity without signs/symptoms of physical distress.      Resistance Training   Training Prescription Yes    Weight 2    Reps 10-15           Perform Capillary Blood Glucose checks as needed.  Exercise Prescription Changes:  Exercise Prescription Changes    Row Name 11/25/20 1100 12/02/20 1600 12/09/20 1200         Response  to Exercise   Blood Pressure (Admit) 128/70 -- 134/80     Blood Pressure (Exercise) 128/84 -- 125/70     Blood Pressure (Exit) 114/80 -- 118/64     Heart Rate (Admit) 103 bpm -- 92 bpm     Heart Rate (Exercise) 102 bpm -- 110 bpm     Heart Rate (Exit) 97 bpm -- 100 bpm     Oxygen Saturation (Admit) 100 % -- 99 %     Oxygen Saturation (Exercise) 97 % -- 95 %     Oxygen Saturation (Exit) 99 % -- 98 %     Rating of Perceived Exertion (Exercise) 11 -- 12     Perceived Dyspnea (Exercise) 12 -- 12     Duration Continue with 30 min of aerobic exercise without signs/symptoms of physical distress. -- Continue with 30 min of aerobic exercise without signs/symptoms of physical distress.     Intensity THRR unchanged -- THRR unchanged           Progression   Progression Continue to progress workloads to maintain intensity without signs/symptoms of physical distress. -- Continue to progress workloads to maintain intensity without signs/symptoms of physical distress.           Resistance Training   Training Prescription Yes -- Yes     Weight 2 -- 2 lbs     Reps 10-15 -- 10-15     Time 10 Minutes -- 10 Minutes           NuStep   Level 1 -- 1  SPM 75 -- 89     Minutes 39 -- 39     METs 1.6 -- 2.2           Home Exercise Plan   Plans to continue exercise at -- Home (comment) --     Frequency -- Add 2 additional days to program exercise sessions. --     Initial Home Exercises Provided -- 12/02/20 --            Exercise Comments:  Exercise Comments    Row Name 11/25/20 1154 12/02/20 1603         Exercise Comments Patient completed first exercise session today. She said she doesnt really exercise so it was different for her, but she enjoyed it and is excited to come back. She tolerated exercise well today. Reviewed home exercise program with patient. She is not currently exercicing at home and plans to start walking.             Exercise Goals and Review:  Exercise Goals    Row  Name 11/19/20 1437 12/09/20 1227           Exercise Goals   Increase Physical Activity Yes Yes      Intervention Develop an individualized exercise prescription for aerobic and resistive training based on initial evaluation findings, risk stratification, comorbidities and participant's personal goals.;Provide advice, education, support and counseling about physical activity/exercise needs. Develop an individualized exercise prescription for aerobic and resistive training based on initial evaluation findings, risk stratification, comorbidities and participant's personal goals.;Provide advice, education, support and counseling about physical activity/exercise needs.      Expected Outcomes Short Term: Attend rehab on a regular basis to increase amount of physical activity.;Long Term: Add in home exercise to make exercise part of routine and to increase amount of physical activity.;Long Term: Exercising regularly at least 3-5 days a week. Short Term: Attend rehab on a regular basis to increase amount of physical activity.;Long Term: Add in home exercise to make exercise part of routine and to increase amount of physical activity.;Long Term: Exercising regularly at least 3-5 days a week.      Increase Strength and Stamina Yes Yes      Intervention Provide advice, education, support and counseling about physical activity/exercise needs.;Develop an individualized exercise prescription for aerobic and resistive training based on initial evaluation findings, risk stratification, comorbidities and participant's personal goals. Provide advice, education, support and counseling about physical activity/exercise needs.;Develop an individualized exercise prescription for aerobic and resistive training based on initial evaluation findings, risk stratification, comorbidities and participant's personal goals.      Expected Outcomes Short Term: Increase workloads from initial exercise prescription for resistance, speed, and  METs.;Short Term: Perform resistance training exercises routinely during rehab and add in resistance training at home;Long Term: Improve cardiorespiratory fitness, muscular endurance and strength as measured by increased METs and functional capacity (6MWT) Short Term: Increase workloads from initial exercise prescription for resistance, speed, and METs.;Short Term: Perform resistance training exercises routinely during rehab and add in resistance training at home;Long Term: Improve cardiorespiratory fitness, muscular endurance and strength as measured by increased METs and functional capacity (6MWT)      Able to understand and use rate of perceived exertion (RPE) scale Yes Yes      Intervention Provide education and explanation on how to use RPE scale Provide education and explanation on how to use RPE scale      Expected Outcomes Short Term: Able to use RPE daily in rehab to express  subjective intensity level;Long Term:  Able to use RPE to guide intensity level when exercising independently Short Term: Able to use RPE daily in rehab to express subjective intensity level;Long Term:  Able to use RPE to guide intensity level when exercising independently      Able to understand and use Dyspnea scale Yes Yes      Intervention Provide education and explanation on how to use Dyspnea scale Provide education and explanation on how to use Dyspnea scale      Expected Outcomes Short Term: Able to use Dyspnea scale daily in rehab to express subjective sense of shortness of breath during exertion;Long Term: Able to use Dyspnea scale to guide intensity level when exercising independently Short Term: Able to use Dyspnea scale daily in rehab to express subjective sense of shortness of breath during exertion;Long Term: Able to use Dyspnea scale to guide intensity level when exercising independently      Knowledge and understanding of Target Heart Rate Range (THRR) Yes Yes      Intervention Provide education and explanation  of THRR including how the numbers were predicted and where they are located for reference Provide education and explanation of THRR including how the numbers were predicted and where they are located for reference      Expected Outcomes Short Term: Able to state/look up THRR;Long Term: Able to use THRR to govern intensity when exercising independently;Short Term: Able to use daily as guideline for intensity in rehab Short Term: Able to state/look up THRR;Long Term: Able to use THRR to govern intensity when exercising independently;Short Term: Able to use daily as guideline for intensity in rehab      Able to check pulse independently Yes Yes      Intervention Provide education and demonstration on how to check pulse in carotid and radial arteries.;Review the importance of being able to check your own pulse for safety during independent exercise Provide education and demonstration on how to check pulse in carotid and radial arteries.;Review the importance of being able to check your own pulse for safety during independent exercise      Expected Outcomes Short Term: Able to explain why pulse checking is important during independent exercise;Long Term: Able to check pulse independently and accurately Short Term: Able to explain why pulse checking is important during independent exercise;Long Term: Able to check pulse independently and accurately      Understanding of Exercise Prescription Yes Yes      Intervention Provide education, explanation, and written materials on patient's individual exercise prescription Provide education, explanation, and written materials on patient's individual exercise prescription      Expected Outcomes Short Term: Able to explain program exercise prescription;Long Term: Able to explain home exercise prescription to exercise independently Short Term: Able to explain program exercise prescription;Long Term: Able to explain home exercise prescription to exercise independently              Exercise Goals Re-Evaluation :  Exercise Goals Re-Evaluation    Row Name 12/09/20 1227             Exercise Goal Re-Evaluation   Exercise Goals Review Increase Strength and Stamina;Increase Physical Activity;Able to understand and use rate of perceived exertion (RPE) scale;Able to understand and use Dyspnea scale;Knowledge and understanding of Target Heart Rate Range (THRR);Able to check pulse independently;Understanding of Exercise Prescription       Comments Patient has completed 5 exercise sessions. She has tolerated exercise well and improving well. She has progressed on the  nuStep and we discussed increasing her intensity on the resistance training next exercise session. She is getting stonger and building more stamina. She is currently exercising on the NuStep at 2.2 METs. Will continue to progress as able.       Expected Outcomes Through exercise at rehab and with a home exercise program, patient will reach their goals.              Discharge Exercise Prescription (Final Exercise Prescription Changes):  Exercise Prescription Changes - 12/09/20 1200      Response to Exercise   Blood Pressure (Admit) 134/80    Blood Pressure (Exercise) 125/70    Blood Pressure (Exit) 118/64    Heart Rate (Admit) 92 bpm    Heart Rate (Exercise) 110 bpm    Heart Rate (Exit) 100 bpm    Oxygen Saturation (Admit) 99 %    Oxygen Saturation (Exercise) 95 %    Oxygen Saturation (Exit) 98 %    Rating of Perceived Exertion (Exercise) 12    Perceived Dyspnea (Exercise) 12    Duration Continue with 30 min of aerobic exercise without signs/symptoms of physical distress.    Intensity THRR unchanged      Progression   Progression Continue to progress workloads to maintain intensity without signs/symptoms of physical distress.      Resistance Training   Training Prescription Yes    Weight 2 lbs    Reps 10-15    Time 10 Minutes      NuStep   Level 1    SPM 89    Minutes 39    METs 2.2            Nutrition:  Target Goals: Understanding of nutrition guidelines, daily intake of sodium 1500mg , cholesterol 200mg , calories 30% from fat and 7% or less from saturated fats, daily to have 5 or more servings of fruits and vegetables.  Biometrics:  Pre Biometrics - 12/09/20 1229      Pre Biometrics   Weight 72.1 kg    BMI (Calculated) 31.04            Nutrition Therapy Plan and Nutrition Goals:  Nutrition Therapy & Goals - 12/05/20 0806      Personal Nutrition Goals   Comments Patient scored 23 on her diet assessment. We will provide heart healthy nutritional education through handouts.      Intervention Plan   Intervention Nutrition handout(s) given to patient.           Nutrition Assessments:  Nutrition Assessments - 11/19/20 1315      MEDFICTS Scores   Pre Score 23          MEDIFICTS Score Key:  ?70 Need to make dietary changes   40-70 Heart Healthy Diet  ? 40 Therapeutic Level Cholesterol Diet   Picture Your Plate Scores:  <02 Unhealthy dietary pattern with much room for improvement.  41-50 Dietary pattern unlikely to meet recommendations for good health and room for improvement.  51-60 More healthful dietary pattern, with some room for improvement.   >60 Healthy dietary pattern, although there may be some specific behaviors that could be improved.    Nutrition Goals Re-Evaluation:   Nutrition Goals Discharge (Final Nutrition Goals Re-Evaluation):   Psychosocial: Target Goals: Acknowledge presence or absence of significant depression and/or stress, maximize coping skills, provide positive support system. Participant is able to verbalize types and ability to use techniques and skills needed for reducing stress and depression.  Initial Review & Psychosocial Screening:  Initial Psych Review & Screening - 11/19/20 1313      Initial Review   Current issues with None Identified      Family Dynamics   Good Support System? Yes     Comments Her husband, her sons, and her sister all support her. She feels like she has a great support system. She has a son in [place] and another son in [place]. They speak daily and she sees them often.      Barriers   Psychosocial barriers to participate in program There are no identifiable barriers or psychosocial needs.      Screening Interventions   Interventions Encouraged to exercise    Expected Outcomes Long Term goal: The participant improves quality of Life and PHQ9 Scores as seen by post scores and/or verbalization of changes;Long Term Goal: Stressors or current issues are controlled or eliminated.           Quality of Life Scores:  Quality of Life - 11/19/20 1438      Quality of Life   Select Quality of Life      Quality of Life Scores   Health/Function Pre 18.5 %    Socioeconomic Pre 28.75 %    Family Pre 27.3 %    GLOBAL Pre 22.08 %          Scores of 19 and below usually indicate a poorer quality of life in these areas.  A difference of  2-3 points is a clinically meaningful difference.  A difference of 2-3 points in the total score of the Quality of Life Index has been associated with significant improvement in overall quality of life, self-image, physical symptoms, and general health in studies assessing change in quality of life.   PHQ-9: Recent Review Flowsheet Data    Depression screen [facility] 2/9 11/19/2020 02/23/2012   Decreased Interest 0 0   Down, Depressed, Hopeless 0 0   PHQ - 2 Score 0 0   Altered sleeping 0 -   Tired, decreased energy 3  -   Change in appetite 0 -   Feeling bad or failure about yourself  0 -   Trouble concentrating 0 -   Moving slowly or fidgety/restless 0 -   Suicidal thoughts 0 -   PHQ-9 Score 3 -   Difficult doing work/chores Not difficult at all -     Interpretation of Total Score  Total Score Depression Severity:  1-4 = Minimal depression, 5-9 = Mild depression, 10-14 = Moderate depression, 15-19 = Moderately severe  depression, 20-27 = Severe depression   Psychosocial Evaluation and Intervention:  Psychosocial Evaluation - 11/19/20 1446      Psychosocial Evaluation & Interventions   Interventions Encouraged to exercise with the program and follow exercise prescription    Comments Patient has no identifiable psychosocial issues.    Expected Outcomes Patient will continue to not have any identifiable psychosocial issues.    Continue Psychosocial Services  No Follow up required           Psychosocial Re-Evaluation:  Psychosocial Re-Evaluation    [place] Name 12/05/20 0808             Psychosocial Re-Evaluation   Current issues with History of Depression       Comments Patient is new to the program completeing 4 sessions. She does has a history of depression and is currently being treated with Citalopram 20 mg qd and Alprazolam 0.5 mg prn. Her inital QOL score was 22.08 and her PhQ9 was  3. She feels her depression is managed. She also takes Ambien for sleep. She seems to enjoy coming to the sessions demonstrating a positive attitude and is very interacitve with others in her class and the staff. We will continue to monitor.       Expected Outcomes Patient's depression will continue to be managed at discharge with no additional psychosocai issues identified.       Interventions Relaxation education;Encouraged to attend Pulmonary Rehabilitation for the exercise;Stress management education       Continue Psychosocial Services  No Follow up required              Psychosocial Discharge (Final Psychosocial Re-Evaluation):  Psychosocial Re-Evaluation - 12/05/20 0808      Psychosocial Re-Evaluation   Current issues with History of Depression    Comments Patient is new to the program completeing 4 sessions. She does has a history of depression and is currently being treated with Citalopram 20 mg qd and Alprazolam 0.5 mg prn. Her inital QOL score was 22.08 and her PhQ9 was 3. She feels her depression is  managed. She also takes Ambien for sleep. She seems to enjoy coming to the sessions demonstrating a positive attitude and is very interacitve with others in her class and the staff. We will continue to monitor.    Expected Outcomes Patient's depression will continue to be managed at discharge with no additional psychosocai issues identified.    Interventions Relaxation education;Encouraged to attend Pulmonary Rehabilitation for the exercise;Stress management education    Continue Psychosocial Services  No Follow up required            Education: Education Goals: Education classes will be provided on a weekly basis, covering required topics. Participant will state understanding/return demonstration of topics presented.  Learning Barriers/Preferences:  Learning Barriers/Preferences - 11/19/20 1315      Learning Barriers/Preferences   Learning Barriers None    Learning Preferences Audio;Written Material;Skilled Demonstration;Verbal Instruction;Video;Computer/Internet;Group Instruction;Individual Instruction;Pictoral           Education Topics: How Lungs Work and Diseases: - Discuss the anatomy of the lungs and diseases that can affect the lungs, such as COPD.   Exercise: -Discuss the importance of exercise, FITT principles of exercise, normal and abnormal responses to exercise, and how to exercise safely.   Environmental Irritants: -Discuss types of environmental irritants and how to limit exposure to environmental irritants.   Meds/Inhalers and oxygen: - Discuss respiratory medications, definition of an inhaler and oxygen, and the proper way to use an inhaler and oxygen.   Energy Saving Techniques: - Discuss methods to conserve energy and decrease shortness of breath when performing activities of daily living.  Flowsheet Row PULMONARY REHAB OTHER RESPIRATORY from 12/04/2020 in [place]  Date 11/27/20  Educator mk  Instruction Review Code 2-  Demonstrated Understanding      Bronchial Hygiene / Breathing Techniques: - Discuss breathing mechanics, pursed-lip breathing technique,  proper posture, effective ways to clear airways, and other functional breathing techniques Flowsheet Row PULMONARY REHAB OTHER RESPIRATORY from 12/04/2020 in [place]  Date 12/04/20  Educator handout  Instruction Review Code 1- Verbalizes Understanding      Cleaning Equipment: - Provides group verbal and written instruction about the health risks of elevated stress, cause of high stress, and healthy ways to reduce stress.   Nutrition I: Fats: - Discuss the types of cholesterol, what cholesterol does to the body, and how cholesterol levels can be controlled.   Nutrition II:  Labels: -Discuss the different components of food labels and how to read food labels.   Respiratory Infections: - Discuss the signs and symptoms of respiratory infections, ways to prevent respiratory infections, and the importance of seeking medical treatment when having a respiratory infection.   Stress I: Signs and Symptoms: - Discuss the causes of stress, how stress may lead to anxiety and depression, and ways to limit stress.   Stress II: Relaxation: -Discuss relaxation techniques to limit stress.   Oxygen for Home/Travel: - Discuss how to prepare for travel when on oxygen and proper ways to transport and store oxygen to ensure safety.   Knowledge Questionnaire Score:  Knowledge Questionnaire Score - 11/19/20 1318      Knowledge Questionnaire Score   Pre Score 15/18           Core Components/Risk Factors/Patient Goals at Admission:  Personal Goals and Risk Factors at Admission - 11/19/20 1318      Core Components/Risk Factors/Patient Goals on Admission    Weight Management Weight Loss;Yes    Intervention Weight Management: Develop a combined nutrition and exercise program designed to reach desired caloric intake, while maintaining  appropriate intake of nutrient and fiber, sodium and fats, and appropriate energy expenditure required for the weight goal.;Weight Management: Provide education and appropriate resources to help participant work on and attain dietary goals.;Weight Management/Obesity: Establish reasonable short term and long term weight goals.;Obesity: Provide education and appropriate resources to help participant work on and attain dietary goals.    Expected Outcomes Short Term: Continue to assess and modify interventions until short term weight is achieved;Long Term: Adherence to nutrition and physical activity/exercise program aimed toward attainment of established weight goal;Weight Maintenance: Understanding of the daily nutrition guidelines, which includes 25-35% calories from fat, 7% or less cal from saturated fats, less than 200mg  cholesterol, less than 1.5gm of sodium, & 5 or more servings of fruits and vegetables daily;Weight Loss: Understanding of general recommendations for a balanced deficit meal plan, which promotes 1-2 lb weight loss per week and includes a negative energy balance of [phone] kcal/d;Understanding recommendations for meals to include 15-35% energy as protein, 25-35% energy from fat, 35-60% energy from carbohydrates, less than 200mg  of dietary cholesterol, 20-35 gm of total fiber daily;Understanding of distribution of calorie intake throughout the day with the consumption of 4-5 meals/snacks;Weight Gain: Understanding of general recommendations for a high calorie, high protein meal plan that promotes weight gain by distributing calorie intake throughout the day with the consumption for 4-5 meals, snacks, and/or supplements    Improve shortness of breath with ADL's Yes    Intervention Provide education, individualized exercise plan and daily activity instruction to help decrease symptoms of SOB with activities of daily living.    Expected Outcomes Short Term: Improve cardiorespiratory fitness to  achieve a reduction of symptoms when performing ADLs;Long Term: Be able to perform more ADLs without symptoms or delay the onset of symptoms    Personal Goal Other Yes    Personal Goal To be able to come off of supplemental oxygen. She has been on it since she was hospitialized with COVID in July of 2021.    Intervention Exercise at rehab and begin a home exercise program.    Expected Outcomes She will be able to reduce her oxygen utilization following rehab.           Core Components/Risk Factors/Patient Goals Review:   Goals and Risk Factor Review    Row Name 12/05/20 [phone]  Core Components/Risk Factors/Patient Goals Review   Personal Goals Review Weight Management/Obesity;Improve shortness of breath with ADL's       Review Patient is new to the program completing 4 sessions losing 2 lbs since her initial visit. She was referred to pulmonary rehab due to SOB. She had COVID in July 21 abd ger SOB has increased since COVID. She was hospitalized for 9 days. Her personal goals for the program are to breathe better and return to her ADL's with less SOB. We will continue to monitor her progress as she works toward meeting these goals.       Expected Outcomes Patient will complete the program meeting both personal and program goals.              Core Components/Risk Factors/Patient Goals at Discharge (Final Review):   Goals and Risk Factor Review - 12/05/20 0811      Core Components/Risk Factors/Patient Goals Review   Personal Goals Review Weight Management/Obesity;Improve shortness of breath with ADL's    Review Patient is new to the program completing 4 sessions losing 2 lbs since her initial visit. She was referred to pulmonary rehab due to SOB. She had COVID in July 21 abd ger SOB has increased since COVID. She was hospitalized for 9 days. Her personal goals for the program are to breathe better and return to her ADL's with less SOB. We will continue to monitor her progress  as she works toward meeting these goals.    Expected Outcomes Patient will complete the program meeting both personal and program goals.           ITP Comments:   Comments: ITP REVIEW Pt is making expected progress toward pulmonary rehab goals after completing 6 sessions. Recommend continued exercise, life style modification, education, and utilization of breathing techniques to increase stamina and strength and decrease shortness of breath with exertion.

## 2020-12-16 ENCOUNTER — Other Ambulatory Visit: Payer: Self-pay

## 2020-12-16 ENCOUNTER — Encounter (HOSPITAL_COMMUNITY)
Admission: RE | Admit: 2020-12-16 | Discharge: 2020-12-16 | Disposition: A | Discharge status: Home / Self Care | Payer: BC Managed Care – PPO | Source: Ambulatory Visit | Attending: Internal Medicine | Admitting: Internal Medicine

## 2020-12-16 DIAGNOSIS — R0602 Shortness of breath: Secondary | ICD-10-CM | POA: Diagnosis not present

## 2020-12-16 NOTE — Progress Notes (Signed)
Daily Session Note  Patient Details  Name: Catherine Hancock MRN: [id number] Date of Birth: 03-Nov-1960 Referring Provider:   Flowsheet Row PULMONARY REHAB OTHER RESP ORIENTATION from 11/19/2020 in [place]  Referring Provider Dr. Chase Caller      Encounter Date: 12/16/2020  Check In:  Session Check In - 12/16/20 1045      Check-In   Supervising physician immediately available to respond to emergencies [facility] MD immediately available    Physician(s) Johnsie Cancel    Location [facility]    Staff Present Geanie Cooley, RN;Madison Audria Nine, MS, Exercise Physiologist;Dalton Kris Mouton, MS, ACSM-CEP, Exercise Physiologist    Virtual Visit No    Medication changes reported     No    Fall or balance concerns reported    No    Tobacco Cessation No Change    Warm-up and Cool-down Performed as group-led instruction    Resistance Training Performed Yes    VAD Patient? No    PAD/SET Patient? No      Pain Assessment   Currently in Pain? No/denies    Multiple Pain Sites No           Capillary Blood Glucose: No results found for this or any previous visit (from the past 24 hour(s)).    Social History   Tobacco Use  Smoking Status Never Smoker  Smokeless Tobacco Never Used    Goals Met:  Proper associated with RPD/PD & O2 Sat Independence with exercise equipment Using PLB without cueing & demonstrates good technique Exercise tolerated well No report of cardiac concerns or symptoms Strength training completed today  Goals Unmet:  Not Applicable  Comments: check out @ 11:45am   [name] is Medical Director for [facility].

## 2020-12-18 ENCOUNTER — Encounter (HOSPITAL_COMMUNITY)
Admission: RE | Admit: 2020-12-18 | Discharge: 2020-12-18 | Disposition: A | Discharge status: Home / Self Care | Payer: BC Managed Care – PPO | Source: Ambulatory Visit | Attending: Internal Medicine | Admitting: Internal Medicine

## 2020-12-18 ENCOUNTER — Other Ambulatory Visit: Payer: Self-pay

## 2020-12-18 DIAGNOSIS — R0602 Shortness of breath: Secondary | ICD-10-CM

## 2020-12-18 NOTE — Progress Notes (Signed)
Daily Session Note  Patient Details  Name: Catherine Hancock MRN: [id number] Date of Birth: [birth year] Referring Provider:   Flowsheet Row PULMONARY REHAB OTHER RESP ORIENTATION from 11/19/2020 in [place]  Referring Provider Dr. Chase Caller      Encounter Date: 12/18/2020  Check In:  Session Check In - 12/18/20 1045      Check-In   Supervising physician immediately available to respond to emergencies [facility] MD immediately available    Physician(s) Johnsie Cancel    Location [facility]    Staff Present [name], MS, Exercise Physiologist;Saudia Smyser Wynetta Emery, RN, BSN    Virtual Visit No    Medication changes reported     No    Fall or balance concerns reported    No    Tobacco Cessation No Change    Warm-up and Cool-down Performed as group-led instruction    Resistance Training Performed Yes    VAD Patient? No    PAD/SET Patient? No      Pain Assessment   Currently in Pain? No/denies    Multiple Pain Sites No           Capillary Blood Glucose: No results found for this or any previous visit (from the past 24 hour(s)).    Social History   Tobacco Use  Smoking Status Never Smoker  Smokeless Tobacco Never Used    Goals Met:  Proper associated with RPD/PD & O2 Sat Independence with exercise equipment Improved SOB with ADL's Using PLB without cueing & demonstrates good technique Exercise tolerated well No report of cardiac concerns or symptoms Strength training completed today  Goals Unmet:  Not Applicable  Comments: Check out 1145.   [name] is Medical Director for [facility] Pul[facility] Re[facility].

## 2020-12-23 ENCOUNTER — Encounter (HOSPITAL_COMMUNITY)
Admission: RE | Admit: 2020-12-23 | Discharge: 2020-12-23 | Disposition: A | Discharge status: Home / Self Care | Payer: BC Managed Care – PPO | Source: Ambulatory Visit | Attending: Internal Medicine | Admitting: Internal Medicine

## 2020-12-23 ENCOUNTER — Other Ambulatory Visit: Payer: Self-pay

## 2020-12-23 VITALS — Wt 157.8 lb

## 2020-12-23 DIAGNOSIS — R0602 Shortness of breath: Secondary | ICD-10-CM

## 2020-12-23 NOTE — Progress Notes (Signed)
Daily Session Note  Patient Details  Name: Catherine Hancock MRN: [id number] Date of Birth: 05-May-1961 Referring Provider:   Flowsheet Row PULMONARY REHAB OTHER RESP ORIENTATION from 11/19/2020 in [place]  Referring Provider Dr. Chase Caller      Encounter Date: 12/23/2020  Check In:  Session Check In - 12/23/20 1045      Check-In   Supervising physician immediately available to respond to emergencies [facility] MD immediately available    Physician(s) Dr. Domenic Polite    Location [facility]    Staff Present Geanie Cooley, RN;Madison Audria Nine, MS, Exercise Physiologist;Dalton Kris Mouton, MS, ACSM-CEP, Exercise Physiologist    Virtual Visit No    Medication changes reported     No    Fall or balance concerns reported    No    Tobacco Cessation No Change    Warm-up and Cool-down Performed as group-led instruction    Resistance Training Performed Yes    VAD Patient? No    PAD/SET Patient? No      Pain Assessment   Currently in Pain? No/denies    Multiple Pain Sites No           Capillary Blood Glucose: No results found for this or any previous visit (from the past 24 hour(s)).    Social History   Tobacco Use  Smoking Status Never Smoker  Smokeless Tobacco Never Used    Goals Met:  Independence with exercise equipment Exercise tolerated well No report of cardiac concerns or symptoms Strength training completed today  Goals Unmet:  Not Applicable  Comments: checkout time is 1145   [name] is Medical Director for [facility] Pulmonary Rehab.

## 2020-12-25 ENCOUNTER — Encounter (HOSPITAL_COMMUNITY): Payer: BC Managed Care – PPO

## 2020-12-26 NOTE — Progress Notes (Signed)
Covid IgG - afer natural infection is posotive. She has natural immunity. REcent evidence these past few weeks indicate that after natural infection just doing 1 shot of mRNA vaccine is most superior but if she is satisfied with natural immunity  there is some value to it

## 2020-12-30 ENCOUNTER — Telehealth: Payer: Self-pay | Admitting: Internal Medicine

## 2020-12-30 ENCOUNTER — Encounter (HOSPITAL_COMMUNITY): Payer: BC Managed Care – PPO

## 2020-12-30 NOTE — Telephone Encounter (Signed)
Good afternoon Dr. Chase Caller, [place] [facility] faxed over a request for ov notes and preoperative and postoperative recommendations for patient to have a tooth extraction and dental implant in an office setting with use of IV sedation. You recently saw this patient on 12/10/2020, if you can please add an addendum to your ov note to reflect any recommendations please - phone number for that office [phone], fax number [phone]. -pr

## 2021-01-01 ENCOUNTER — Encounter (HOSPITAL_COMMUNITY)
Admission: RE | Admit: 2021-01-01 | Discharge: 2021-01-01 | Disposition: A | Discharge status: Home / Self Care | Payer: BC Managed Care – PPO | Source: Ambulatory Visit | Attending: Internal Medicine | Admitting: Internal Medicine

## 2021-01-01 ENCOUNTER — Telehealth: Payer: Self-pay | Admitting: *Deleted

## 2021-01-01 ENCOUNTER — Other Ambulatory Visit: Payer: Self-pay

## 2021-01-01 DIAGNOSIS — R0602 Shortness of breath: Secondary | ICD-10-CM | POA: Diagnosis present

## 2021-01-01 NOTE — Progress Notes (Signed)
Daily Session Note  Patient Details  Name: Catherine Hancock MRN: [id number] Date of Birth: 1961-10-06 Referring Provider:   Flowsheet Row PULMONARY REHAB OTHER RESP ORIENTATION from 11/19/2020 in [place]  Referring Provider Dr. Chase Caller      Encounter Date: 01/01/2021  Check In:  Session Check In - 01/01/21 1045      Check-In   Supervising physician immediately available to respond to emergencies [facility] MD immediately available    Physician(s) Dr. Harl Bowie    Location [facility]    Staff Present Hoy Register, MS, ACSM-CEP, Exercise Physiologist;Debra Wynetta Emery, RN, BSN;Madison Audria Nine, MS, Exercise Physiologist    Virtual Visit No    Medication changes reported     No    Fall or balance concerns reported    No    Tobacco Cessation No Change    Warm-up and Cool-down Performed as group-led instruction    Resistance Training Performed Yes    VAD Patient? No    PAD/SET Patient? No      Pain Assessment   Currently in Pain? No/denies    Multiple Pain Sites No           Capillary Blood Glucose: No results found for this or any previous visit (from the past 24 hour(s)).    Social History   Tobacco Use  Smoking Status Never Smoker  Smokeless Tobacco Never Used    Goals Met:  Proper associated with RPD/PD & O2 Sat Independence with exercise equipment Improved SOB with ADL's Using PLB without cueing & demonstrates good technique Exercise tolerated well No report of cardiac concerns or symptoms Strength training completed today  Goals Unmet:  Not Applicable  Comments:  Check out 1145.   [name] is Medical Director for [facility].

## 2021-01-01 NOTE — Telephone Encounter (Signed)
   Primary Cardiologist: Buford Dresser, MD  Chart reviewed as part of pre-operative protocol coverage.   Simple dental extractions are considered low risk procedures per guidelines and generally do not require any specific cardiac clearance. It is also generally accepted that for simple extractions and dental cleanings, there is no need to interrupt blood thinner therapy.  I will route this recommendation to the requesting party via Epic fax function and remove from pre-op pool.  Please call with questions.  [place], [place] 01/01/2021, 9:15 AM

## 2021-01-01 NOTE — Telephone Encounter (Signed)
   [facility] Pre-operative Risk Assessment    HEARTCARE STAFF: - Please ensure there is not already an duplicate clearance open for this procedure. - Under Visit Info/Reason for Call, type in Other and utilize the format Clearance MM/DD/YY or Clearance TBD. Do not use dashes or single digits. - If request is for dental extraction, please clarify the # of teeth to be extracted.  Request for surgical clearance:  1. What type of surgery is being performed? Extraction of single tooth and placement of a dental implant    2. When is this surgery scheduled? TBD   3. What type of clearance is required (medical clearance vs. Pharmacy clearance to hold med vs. Both)? medical  4. Are there any medications that need to be held prior to surgery and how long?none   5. Practice name and name of physician performing surgery? [facility]   6. What is the office phone number? [phone]   7.   What is the office fax number? [phone]  8.   Anesthesia type (None, local, MAC, general) ? IV sedation using versed, fentanyl and propofol  They are also asking about SBE   Fredia Beets 01/01/2021, 7:56 AM  _________________________________________________________________   (provider comments below)

## 2021-01-06 ENCOUNTER — Encounter (HOSPITAL_COMMUNITY)
Admission: RE | Admit: 2021-01-06 | Discharge: 2021-01-06 | Disposition: A | Discharge status: Home / Self Care | Payer: BC Managed Care – PPO | Source: Ambulatory Visit | Attending: Internal Medicine | Admitting: Internal Medicine

## 2021-01-06 ENCOUNTER — Other Ambulatory Visit: Payer: Self-pay

## 2021-01-06 VITALS — Wt 158.5 lb

## 2021-01-06 DIAGNOSIS — R0602 Shortness of breath: Secondary | ICD-10-CM

## 2021-01-06 NOTE — Progress Notes (Signed)
Daily Session Note  Patient Details  Name: Catherine Hancock MRN: [id number] Date of Birth: [birth year] Referring Provider:   Flowsheet Row PULMONARY REHAB OTHER RESP ORIENTATION from 11/19/2020 in [place]  Referring Provider Dr. Chase Caller      Encounter Date: 01/06/2021  Check In:  Session Check In - 01/06/21 1045      Check-In   Supervising physician immediately available to respond to emergencies [facility] MD immediately available    Physician(s) Dr. Harl Bowie    [facility]    Staff Present Hoy Register, MS, ACSM-CEP, Exercise Physiologist;Madison Audria Nine, MS, Exercise Physiologist    Virtual Visit No    Medication changes reported     No    Fall or balance concerns reported    No    Tobacco Cessation No Change    Warm-up and Cool-down Performed as group-led instruction    Resistance Training Performed Yes    VAD Patient? No    PAD/SET Patient? No      Pain Assessment   Currently in Pain? No/denies    Multiple Pain Sites No           Capillary Blood Glucose: No results found for this or any previous visit (from the past 24 hour(s)).    Social History   Tobacco Use  Smoking Status Never Smoker  Smokeless Tobacco Never Used    Goals Met:  Independence with exercise equipment Exercise tolerated well No report of cardiac concerns or symptoms Strength training completed today  Goals Unmet:  Not Applicable  Comments: checkout time is 1145   [name] is Medical Director for [facility] Pul[facility] Re[facility].

## 2021-01-08 ENCOUNTER — Encounter (HOSPITAL_COMMUNITY)
Admission: RE | Admit: 2021-01-08 | Discharge: 2021-01-08 | Disposition: A | Discharge status: Home / Self Care | Payer: BC Managed Care – PPO | Source: Ambulatory Visit | Attending: Internal Medicine | Admitting: Internal Medicine

## 2021-01-08 ENCOUNTER — Other Ambulatory Visit: Payer: Self-pay

## 2021-01-08 DIAGNOSIS — R0602 Shortness of breath: Secondary | ICD-10-CM

## 2021-01-08 NOTE — Progress Notes (Signed)
Pulmonary Individual Treatment Plan  Patient Details  Name: Catherine Hancock MRN: [id number] Date of Birth: [birth year] Referring Provider:   Flowsheet Row PULMONARY REHAB OTHER RESP ORIENTATION from 11/19/2020 in [facility]  Referring Provider Dr. Chase Caller      Initial Encounter Date:  Flowsheet Row PULMONARY REHAB OTHER RESP ORIENTATION from 11/19/2020 in [place]  Date 11/19/20      Visit Diagnosis: Shortness of breath  Patient's Home Medications on Admission:   Current Outpatient Medications:  .  albuterol (ACCUNEB) 0.63 MG/3ML nebulizer solution, Take by nebulization 3 (three) times daily., Disp: , Rfl:  .  albuterol (VENTOLIN HFA) 108 (90 Base) MCG/ACT inhaler, Inhale 2 puffs into the lungs every 6 (six) hours., Disp: 8 g, Rfl: 0 .  ALPRAZolam (XANAX) 0.5 MG tablet, Take 0.25 mg by mouth as directed. PRN, Disp: , Rfl:  .  aspirin EC 81 MG tablet, Take 1 tablet (81 mg total) by mouth daily., Disp: , Rfl:  .  azelastine (ASTELIN) 0.1 % nasal spray, Place 2 sprays into the nose 4 (four) times daily as needed., Disp: , Rfl:  .  BIOTIN PO, Take 1 tablet by mouth as directed., Disp: , Rfl:  .  budesonide-formoterol (SYMBICORT) 160-4.5 MCG/ACT inhaler, Inhale 2 puffs into the lungs 2 (two) times daily., Disp: 1 each, Rfl: 6 .  budesonide-formoterol (SYMBICORT) 160-4.5 MCG/ACT inhaler, Inhale 2 puffs into the lungs 2 (two) times daily., Disp: , Rfl:  .  Calcium Carb-Cholecalciferol (CALCIUM 600+D3) 600-800 MG-UNIT TABS, Take 1 tablet by mouth daily., Disp: , Rfl:  .  Cholecalciferol (VITAMIN D) 50 MCG (2000 UT) CAPS, Take 1 capsule by mouth daily with breakfast. Takes in addition to weekly ergocalciferol, Disp: , Rfl:  .  Cholecalciferol (VITAMIN D3 PO), Take 25,000 Units by mouth once a week., Disp: , Rfl:  .  citalopram (CELEXA) 20 MG tablet, Take 20 mg by mouth daily. , Disp: , Rfl:  .  cyclobenzaprine (FLEXERIL) 10 MG tablet, TAKE 1  TABLET THREE TIMES A DAY AS NEEDED FOR MUSCLE SPASMS (Patient taking differently: Take 10 mg by mouth at bedtime.), Disp: 90 tablet, Rfl: 0 .  diclofenac (VOLTAREN) 50 MG EC tablet, Take 1 tablet by mouth 2 (two) times daily., Disp: , Rfl:  .  DONNATAL 16.2 MG tablet, Take 16.2 mg by mouth daily as needed (Stomach pain). , Disp: , Rfl:  .  EPINEPHrine 0.3 mg/0.3 mL IJ SOAJ injection, Inject 0.3 mg into the muscle as needed for anaphylaxis. , Disp: , Rfl:  .  esomeprazole (NEXIUM) 40 MG capsule, Take 40 mg by mouth 2 (two) times daily before a meal. , Disp: , Rfl:  .  HYDROcodone-acetaminophen (NORCO/VICODIN) 5-325 MG tablet, Take 1 tablet by mouth every 6 (six) hours as needed for moderate pain., Disp: , Rfl:  .  ibuprofen (ADVIL,MOTRIN) 800 MG tablet, Take 800 mg by mouth every 8 (eight) hours as needed (pain)., Disp: , Rfl:  .  levocetirizine (XYZAL) 5 MG tablet, Take 1 tablet by mouth daily., Disp: , Rfl:  .  levothyroxine (SYNTHROID) 50 MCG tablet, Take 50 mcg by mouth daily. , Disp: , Rfl:  .  lubiprostone (AMITIZA) 24 MCG capsule, Take 1 capsule by mouth 2 (two) times daily., Disp: , Rfl:  .  meclizine (ANTIVERT) 25 MG tablet, Take 1 tablet by mouth 3 (three) times daily as needed., Disp: , Rfl:  .  montelukast (SINGULAIR) 10 MG tablet, Take 10 mg by mouth at  bedtime., Disp: , Rfl:  .  nitroGLYCERIN (NITROSTAT) 0.4 MG SL tablet, Place 1 tablet (0.4 mg total) under the tongue every 5 (five) minutes as needed for chest pain., Disp: 25 tablet, Rfl: 12 .  polyethylene glycol powder (GLYCOLAX/MIRALAX) powder, Take 17 g by mouth as needed for mild constipation. , Disp: , Rfl:  .  predniSONE (DELTASONE) 10 MG tablet, 4 x 2 days, 3 x 2 days, 2 x 2 days, 1 x 2 days, then stop, Disp: 20 tablet, Rfl: 0 .  Prenatal Vit-Fe Fumarate-FA (M-NATAL PLUS) 27-1 MG TABS, Take 1 tablet by mouth at bedtime., Disp: , Rfl:  .  topiramate (TOPAMAX) 50 MG tablet, Take 50-100 mg by mouth See admin instructions. 50mg  in  the morning and 100mg  by mouth at night, Disp: , Rfl:  .  TRULANCE 3 MG TABS, Take 1 tablet by mouth daily., Disp: , Rfl:  .  Vitamin D, Ergocalciferol, (DRISDOL) 1.25 MG (50000 UNIT) CAPS capsule, Take 50,000 Units by mouth once a week., Disp: , Rfl:  .  zinc gluconate 50 MG tablet, Take 50 mg by mouth daily., Disp: , Rfl:  .  zolpidem (AMBIEN) 10 MG tablet, Take 0.5 tablets by mouth at bedtime as needed for sleep. , Disp: , Rfl:   Past Medical History: Past Medical History:  Diagnosis Date  . Asthma   . Asthma   . Chest pain    LHC 7/08: EF 60%, normal coronaries; ETT-Echo 6/11: EF 55-60%, no ischemia  . Chronic back pain   . CTS (carpal tunnel syndrome)    bil  . CTS (carpal tunnel syndrome)   . Depression 05/28/2020  . Hypothyroidism   . IBS (irritable bowel syndrome)   . Internal hemorrhoids   . Migraines   . OSA on CPAP 11/13/2017   Mild with AHI 13/hr   . Pulmonary nodule, right     Tobacco Use: Social History   Tobacco Use  Smoking Status Never Smoker  Smokeless Tobacco Never Used    Labs: Recent Review Scientist, physiological    Labs for ITP Cardiac and Pulmonary Rehab Latest Ref Rng & Units 05/28/2020   Trlycerides <150 mg/dL 123      Capillary Blood Glucose: No results found for: GLUCAP   Pulmonary Assessment Scores:  Pulmonary Assessment Scores    Row Name 11/19/20 1321         ADL UCSD   ADL Phase Entry     SOB Score total 59           CAT Score   CAT Score 22           mMRC Score   mMRC Score 2           UCSD: Self-administered rating of dyspnea associated with activities of daily living (ADLs) 6-point scale (0 = "not at all" to 5 = "maximal or unable to do because of breathlessness")  Scoring Scores range from 0 to 120.  Minimally important difference is 5 units  CAT: CAT can identify the health impairment of COPD patients and is better correlated with disease progression.  CAT has a scoring range of zero to 40. The CAT score is classified  into four groups of low (less than 10), medium (10 - 20), high (21-30) and very high (31-40) based on the impact level of disease on health status. A CAT score over 10 suggests significant symptoms.  A worsening CAT score could be explained by an exacerbation, poor medication adherence, poor inhaler  technique, or progression of COPD or comorbid conditions.  CAT MCID is 2 points  mMRC: mMRC (Modified Medical Research Council) Dyspnea Scale is used to assess the degree of baseline functional disability in patients of respiratory disease due to dyspnea. No minimal important difference is established. A decrease in score of 1 point or greater is considered a positive change.   Pulmonary Function Assessment:  Pulmonary Function Assessment - 11/19/20 1310      Breath   Shortness of Breath Yes;Limiting activity           Exercise Target Goals: Exercise Program Goal: Individual exercise prescription set using results from initial 6 min walk test and THRR while considering  patient's activity barriers and safety.   Exercise Prescription Goal: Initial exercise prescription builds to 30-45 minutes a day of aerobic activity, 2-3 days per week.  Home exercise guidelines will be given to patient during program as part of exercise prescription that the participant will acknowledge.  Activity Barriers & Risk Stratification:  Activity Barriers & Cardiac Risk Stratification - 11/19/20 1311      Activity Barriers & Cardiac Risk Stratification   Activity Barriers Deconditioning;Shortness of Breath;Chest Pain/Angina    Cardiac Risk Stratification Low           6 Minute Walk:  6 Minute Walk    Row Name 11/19/20 1433         6 Minute Walk   Phase Initial     Distance 1400 feet     Walk Time 6 minutes     # of Rest Breaks 0     MPH 3.2     METS 3.92     RPE 11     Perceived Dyspnea  11     VO2 Peak 13.72     Symptoms No     Resting HR 75 bpm     Resting BP 112/72     Resting Oxygen  Saturation  96 %     Exercise Oxygen Saturation  during 6 min walk 96 %     Max Ex. HR 102 bpm     Max Ex. BP 130/78     2 Minute Post BP 120/80           Interval HR   1 Minute HR 94     2 Minute HR 98     3 Minute HR 101     4 Minute HR 101     5 Minute HR 102     6 Minute HR 101     2 Minute Post HR 86     Interval Heart Rate? Yes           Interval Oxygen   Interval Oxygen? Yes     Baseline Oxygen Saturation % 96 %     1 Minute Oxygen Saturation % 96 %     1 Minute Liters of Oxygen 0 L     2 Minute Oxygen Saturation % 97 %     2 Minute Liters of Oxygen 0 L     3 Minute Oxygen Saturation % 97 %     3 Minute Liters of Oxygen 0 L     4 Minute Oxygen Saturation % 97 %     4 Minute Liters of Oxygen 0 L     5 Minute Oxygen Saturation % 96 %     5 Minute Liters of Oxygen 0 L     6 Minute Oxygen Saturation % 96 %  6 Minute Liters of Oxygen 0 L     2 Minute Post Oxygen Saturation % 98 %     2 Minute Post Liters of Oxygen 0 L            Oxygen Initial Assessment:  Oxygen Initial Assessment - 11/19/20 1444      Initial 6 min Walk   Oxygen Used None      Program Oxygen Prescription   Program Oxygen Prescription None   she has been using 2 L PRN since she got out of the hospital in July of 2020 but did not require it during her walk test. We will start her on RA and monitor her levels.     Intervention   Short Term Goals To learn and exhibit compliance with exercise, home and travel O2 prescription;To learn and understand importance of monitoring SPO2 with pulse oximeter and demonstrate accurate use of the pulse oximeter.;To learn and understand importance of maintaining oxygen saturations>88%;To learn and demonstrate proper pursed lip breathing techniques or other breathing techniques.;To learn and demonstrate proper use of respiratory medications    Long  Term Goals Exhibits compliance with exercise, home and travel O2 prescription;Maintenance of O2  saturations>88%;Verbalizes importance of monitoring SPO2 with pulse oximeter and return demonstration;Compliance with respiratory medication;Demonstrates proper use of MDI's;Exhibits proper breathing techniques, such as pursed lip breathing or other method taught during program session           Oxygen Re-Evaluation:  Oxygen Re-Evaluation    Row Name 12/09/20 1230 01/06/21 1216           Program Oxygen Prescription   Program Oxygen Prescription None None             Home Oxygen   Home Oxygen Device Home Concentrator;E-Tanks Home Concentrator;E-Tanks      Sleep Oxygen Prescription CPAP CPAP      Liters per minute 6 6      Home Exercise Oxygen Prescription Continuous Continuous      Liters per minute 2 2      Home Resting Oxygen Prescription None None      Compliance with Home Oxygen Use Yes Yes             Goals/Expected Outcomes   Short Term Goals To learn and exhibit compliance with exercise, home and travel O2 prescription;To learn and understand importance of monitoring SPO2 with pulse oximeter and demonstrate accurate use of the pulse oximeter.;To learn and understand importance of maintaining oxygen saturations>88%;To learn and demonstrate proper pursed lip breathing techniques or other breathing techniques.;To learn and demonstrate proper use of respiratory medications To learn and exhibit compliance with exercise, home and travel O2 prescription;To learn and understand importance of monitoring SPO2 with pulse oximeter and demonstrate accurate use of the pulse oximeter.;To learn and understand importance of maintaining oxygen saturations>88%;To learn and demonstrate proper pursed lip breathing techniques or other breathing techniques.;To learn and demonstrate proper use of respiratory medications      Long  Term Goals Exhibits compliance with exercise, home and travel O2 prescription;Maintenance of O2 saturations>88%;Verbalizes importance of monitoring SPO2 with pulse oximeter  and return demonstration;Compliance with respiratory medication;Demonstrates proper use of MDI's;Exhibits proper breathing techniques, such as pursed lip breathing or other method taught during program session Exhibits compliance with exercise, home and travel O2 prescription;Maintenance of O2 saturations>88%;Verbalizes importance of monitoring SPO2 with pulse oximeter and return demonstration;Compliance with respiratory medication;Demonstrates proper use of MDI's;Exhibits proper breathing techniques, such as pursed lip breathing or other method taught  during program session             Oxygen Discharge (Final Oxygen Re-Evaluation):  Oxygen Re-Evaluation - 01/06/21 1216      Program Oxygen Prescription   Program Oxygen Prescription None      Home Oxygen   Home Oxygen Device Home Concentrator;E-Tanks    Sleep Oxygen Prescription CPAP    Liters per minute 6    Home Exercise Oxygen Prescription Continuous    Liters per minute 2    Home Resting Oxygen Prescription None    Compliance with Home Oxygen Use Yes      Goals/Expected Outcomes   Short Term Goals To learn and exhibit compliance with exercise, home and travel O2 prescription;To learn and understand importance of monitoring SPO2 with pulse oximeter and demonstrate accurate use of the pulse oximeter.;To learn and understand importance of maintaining oxygen saturations>88%;To learn and demonstrate proper pursed lip breathing techniques or other breathing techniques.;To learn and demonstrate proper use of respiratory medications    Long  Term Goals Exhibits compliance with exercise, home and travel O2 prescription;Maintenance of O2 saturations>88%;Verbalizes importance of monitoring SPO2 with pulse oximeter and return demonstration;Compliance with respiratory medication;Demonstrates proper use of MDI's;Exhibits proper breathing techniques, such as pursed lip breathing or other method taught during program session           Initial  Exercise Prescription:  Initial Exercise Prescription - 11/19/20 1400      Date of Initial Exercise RX and Referring Provider   Date 11/19/20    Referring Provider Dr. Chase Caller    Expected Discharge Date 03/19/21      NuStep   Level 1    SPM 80    Minutes 22      Arm Ergometer   Level 1    RPM 60    Minutes 22      Intensity   THRR 40-80% of Max Heartrate 64-129    Ratings of Perceived Exertion 11-13    Perceived Dyspnea 0-4      Progression   Progression Continue to progress workloads to maintain intensity without signs/symptoms of physical distress.      Resistance Training   Training Prescription Yes    Weight 2    Reps 10-15           Perform Capillary Blood Glucose checks as needed.  Exercise Prescription Changes:   Exercise Prescription Changes    Row Name 11/25/20 1100 12/02/20 1600 12/09/20 1200 12/23/20 1200 01/06/21 1200     Response to Exercise   Blood Pressure (Admit) 128/70 - 134/80 120/72 116/56   Blood Pressure (Exercise) 128/84 - 125/70 118/64 128/60   Blood Pressure (Exit) 114/80 - 118/64 114/78 110/72   Heart Rate (Admit) 103 bpm - 92 bpm 67 bpm 104 bpm   Heart Rate (Exercise) 102 bpm - 110 bpm 87 bpm 114 bpm   Heart Rate (Exit) 97 bpm - 100 bpm 77 bpm 95 bpm   Oxygen Saturation (Admit) 100 % - 99 % 98 % 99 %   Oxygen Saturation (Exercise) 97 % - 95 % 97 % 94 %   Oxygen Saturation (Exit) 99 % - 98 % 98 % 98 %   Rating of Perceived Exertion (Exercise) 11 - 12 12 15    Perceived Dyspnea (Exercise) 12 - 12 11 11    Duration Continue with 30 min of aerobic exercise without signs/symptoms of physical distress. - Continue with 30 min of aerobic exercise without signs/symptoms of physical distress. Continue with 30  min of aerobic exercise without signs/symptoms of physical distress. Continue with 30 min of aerobic exercise without signs/symptoms of physical distress.   Intensity THRR unchanged - THRR unchanged THRR unchanged THRR unchanged      Progression   Progression Continue to progress workloads to maintain intensity without signs/symptoms of physical distress. - Continue to progress workloads to maintain intensity without signs/symptoms of physical distress. Continue to progress workloads to maintain intensity without signs/symptoms of physical distress. Continue to progress workloads to maintain intensity without signs/symptoms of physical distress.     Resistance Training   Training Prescription Yes - Yes Yes Yes   Weight 2 - 2 lbs 3 lbs 3 lbs   Reps 10-15 - 10-15 10-15 10-15   Time 10 Minutes - 10 Minutes 10 Minutes 10 Minutes     NuStep   Level 1 - 1 2 2    SPM 75 - 89 112 108   Minutes 39 - 39 39 39   METs 1.6 - 2.2 2.9 2.7     Home Exercise Plan   Plans to continue exercise at - Home (comment) - - -   Frequency - Add 2 additional days to program exercise sessions. - - -   Initial Home Exercises Provided - 12/02/20 - - -          Exercise Comments:   Exercise Comments    Row Name 11/25/20 1154 12/02/20 1603         Exercise Comments Patient completed first exercise session today. She said she doesnt really exercise so it was different for her, but she enjoyed it and is excited to come back. She tolerated exercise well today. Reviewed home exercise program with patient. She is not currently exercicing at home and plans to start walking.             Exercise Goals and Review:   Exercise Goals    Row Name 11/19/20 1437 12/09/20 1227 01/06/21 1213         Exercise Goals   Increase Physical Activity Yes Yes Yes     Intervention Develop an individualized exercise prescription for aerobic and resistive training based on initial evaluation findings, risk stratification, comorbidities and participant's personal goals.;Provide advice, education, support and counseling about physical activity/exercise needs. Develop an individualized exercise prescription for aerobic and resistive training based on initial  evaluation findings, risk stratification, comorbidities and participant's personal goals.;Provide advice, education, support and counseling about physical activity/exercise needs. Develop an individualized exercise prescription for aerobic and resistive training based on initial evaluation findings, risk stratification, comorbidities and participant's personal goals.;Provide advice, education, support and counseling about physical activity/exercise needs.     Expected Outcomes Short Term: Attend rehab on a regular basis to increase amount of physical activity.;Long Term: Add in home exercise to make exercise part of routine and to increase amount of physical activity.;Long Term: Exercising regularly at least 3-5 days a week. Short Term: Attend rehab on a regular basis to increase amount of physical activity.;Long Term: Add in home exercise to make exercise part of routine and to increase amount of physical activity.;Long Term: Exercising regularly at least 3-5 days a week. Short Term: Attend rehab on a regular basis to increase amount of physical activity.;Long Term: Add in home exercise to make exercise part of routine and to increase amount of physical activity.;Long Term: Exercising regularly at least 3-5 days a week.     Increase Strength and Stamina Yes Yes Yes     Intervention Provide advice,  education, support and counseling about physical activity/exercise needs.;Develop an individualized exercise prescription for aerobic and resistive training based on initial evaluation findings, risk stratification, comorbidities and participant's personal goals. Provide advice, education, support and counseling about physical activity/exercise needs.;Develop an individualized exercise prescription for aerobic and resistive training based on initial evaluation findings, risk stratification, comorbidities and participant's personal goals. Provide advice, education, support and counseling about physical activity/exercise  needs.;Develop an individualized exercise prescription for aerobic and resistive training based on initial evaluation findings, risk stratification, comorbidities and participant's personal goals.     Expected Outcomes Short Term: Increase workloads from initial exercise prescription for resistance, speed, and METs.;Short Term: Perform resistance training exercises routinely during rehab and add in resistance training at home;Long Term: Improve cardiorespiratory fitness, muscular endurance and strength as measured by increased METs and functional capacity (6MWT) Short Term: Increase workloads from initial exercise prescription for resistance, speed, and METs.;Short Term: Perform resistance training exercises routinely during rehab and add in resistance training at home;Long Term: Improve cardiorespiratory fitness, muscular endurance and strength as measured by increased METs and functional capacity (6MWT) Short Term: Increase workloads from initial exercise prescription for resistance, speed, and METs.;Short Term: Perform resistance training exercises routinely during rehab and add in resistance training at home;Long Term: Improve cardiorespiratory fitness, muscular endurance and strength as measured by increased METs and functional capacity (6MWT)     Able to understand and use rate of perceived exertion (RPE) scale Yes Yes Yes     Intervention Provide education and explanation on how to use RPE scale Provide education and explanation on how to use RPE scale Provide education and explanation on how to use RPE scale     Expected Outcomes Short Term: Able to use RPE daily in rehab to express subjective intensity level;Long Term:  Able to use RPE to guide intensity level when exercising independently Short Term: Able to use RPE daily in rehab to express subjective intensity level;Long Term:  Able to use RPE to guide intensity level when exercising independently Short Term: Able to use RPE daily in rehab to express  subjective intensity level;Long Term:  Able to use RPE to guide intensity level when exercising independently     Able to understand and use Dyspnea scale Yes Yes Yes     Intervention Provide education and explanation on how to use Dyspnea scale Provide education and explanation on how to use Dyspnea scale Provide education and explanation on how to use Dyspnea scale     Expected Outcomes Short Term: Able to use Dyspnea scale daily in rehab to express subjective sense of shortness of breath during exertion;Long Term: Able to use Dyspnea scale to guide intensity level when exercising independently Short Term: Able to use Dyspnea scale daily in rehab to express subjective sense of shortness of breath during exertion;Long Term: Able to use Dyspnea scale to guide intensity level when exercising independently Short Term: Able to use Dyspnea scale daily in rehab to express subjective sense of shortness of breath during exertion;Long Term: Able to use Dyspnea scale to guide intensity level when exercising independently     Knowledge and understanding of Target Heart Rate Range (THRR) Yes Yes Yes     Intervention Provide education and explanation of THRR including how the numbers were predicted and where they are located for reference Provide education and explanation of THRR including how the numbers were predicted and where they are located for reference Provide education and explanation of THRR including how the numbers were predicted and  where they are located for reference     Expected Outcomes Short Term: Able to state/look up THRR;Long Term: Able to use THRR to govern intensity when exercising independently;Short Term: Able to use daily as guideline for intensity in rehab Short Term: Able to state/look up THRR;Long Term: Able to use THRR to govern intensity when exercising independently;Short Term: Able to use daily as guideline for intensity in rehab Short Term: Able to state/look up THRR;Long Term: Able to  use THRR to govern intensity when exercising independently;Short Term: Able to use daily as guideline for intensity in rehab     Able to check pulse independently Yes Yes Yes     Intervention Provide education and demonstration on how to check pulse in carotid and radial arteries.;Review the importance of being able to check your own pulse for safety during independent exercise Provide education and demonstration on how to check pulse in carotid and radial arteries.;Review the importance of being able to check your own pulse for safety during independent exercise Provide education and demonstration on how to check pulse in carotid and radial arteries.;Review the importance of being able to check your own pulse for safety during independent exercise     Expected Outcomes Short Term: Able to explain why pulse checking is important during independent exercise;Long Term: Able to check pulse independently and accurately Short Term: Able to explain why pulse checking is important during independent exercise;Long Term: Able to check pulse independently and accurately Short Term: Able to explain why pulse checking is important during independent exercise;Long Term: Able to check pulse independently and accurately     Understanding of Exercise Prescription Yes Yes Yes     Intervention Provide education, explanation, and written materials on patient's individual exercise prescription Provide education, explanation, and written materials on patient's individual exercise prescription Provide education, explanation, and written materials on patient's individual exercise prescription     Expected Outcomes Short Term: Able to explain program exercise prescription;Long Term: Able to explain home exercise prescription to exercise independently Short Term: Able to explain program exercise prescription;Long Term: Able to explain home exercise prescription to exercise independently Short Term: Able to explain program exercise  prescription;Long Term: Able to explain home exercise prescription to exercise independently            Exercise Goals Re-Evaluation :  Exercise Goals Re-Evaluation    Row Name 12/09/20 1227 01/06/21 1213           Exercise Goal Re-Evaluation   Exercise Goals Review Increase Strength and Stamina;Increase Physical Activity;Able to understand and use rate of perceived exertion (RPE) scale;Able to understand and use Dyspnea scale;Knowledge and understanding of Target Heart Rate Range (THRR);Able to check pulse independently;Understanding of Exercise Prescription Increase Strength and Stamina;Increase Physical Activity;Able to understand and use rate of perceived exertion (RPE) scale;Able to understand and use Dyspnea scale;Knowledge and understanding of Target Heart Rate Range (THRR);Able to check pulse independently;Understanding of Exercise Prescription      Comments Patient has completed 5 exercise sessions. She has tolerated exercise well and improving well. She has progressed on the nuStep and we discussed increasing her intensity on the resistance training next exercise session. She is getting stonger and building more stamina. She is currently exercising on the NuStep at 2.2 METs. Will continue to progress as able. Patient has completed 11 exercise sessions. She tolerates exercise well and is improving. She is building up stamina and feels that her daily life is getting easier. She is getting stronger. She is continuing  to exercise at home. We discussed increasing her intensity at home. She is currently exercising at 2.7 METs on the Nustep. Will continue to progress as able.      Expected Outcomes Through exercise at rehab and with a home exercise program, patient will reach their goals. Through exercise at rehab and with a home exercise program, patient will reach their goals.             Discharge Exercise Prescription (Final Exercise Prescription Changes):  Exercise Prescription Changes  - 01/06/21 1200      Response to Exercise   Blood Pressure (Admit) 116/56    Blood Pressure (Exercise) 128/60    Blood Pressure (Exit) 110/72    Heart Rate (Admit) 104 bpm    Heart Rate (Exercise) 114 bpm    Heart Rate (Exit) 95 bpm    Oxygen Saturation (Admit) 99 %    Oxygen Saturation (Exercise) 94 %    Oxygen Saturation (Exit) 98 %    Rating of Perceived Exertion (Exercise) 15    Perceived Dyspnea (Exercise) 11    Duration Continue with 30 min of aerobic exercise without signs/symptoms of physical distress.    Intensity THRR unchanged      Progression   Progression Continue to progress workloads to maintain intensity without signs/symptoms of physical distress.      Resistance Training   Training Prescription Yes    Weight 3 lbs    Reps 10-15    Time 10 Minutes      NuStep   Level 2    SPM 108    Minutes 39    METs 2.7           Nutrition:  Target Goals: Understanding of nutrition guidelines, daily intake of sodium 1500mg , cholesterol 200mg , calories 30% from fat and 7% or less from saturated fats, daily to have 5 or more servings of fruits and vegetables.  Biometrics:  Pre Biometrics - 01/06/21 1216      Pre Biometrics   Weight 71.9 kg            Nutrition Therapy Plan and Nutrition Goals:  Nutrition Therapy & Goals - 01/02/21 0905      Personal Nutrition Goals   Comments We provide 2 educational sessions on nutrition.Handout provided and discussed. We also offfer referral to RD if patient is interested. Patient declined.      Intervention Plan   Intervention Nutrition handout(s) given to patient.           Nutrition Assessments:  Nutrition Assessments - 11/19/20 1315      MEDFICTS Scores   Pre Score 23          MEDIFICTS Score Key:  ?70 Need to make dietary changes   40-70 Heart Healthy Diet  ? 40 Therapeutic Level Cholesterol Diet   Picture Your Plate Scores:  <05 Unhealthy dietary pattern with much room for  improvement.  41-50 Dietary pattern unlikely to meet recommendations for good health and room for improvement.  51-60 More healthful dietary pattern, with some room for improvement.   >60 Healthy dietary pattern, although there may be some specific behaviors that could be improved.    Nutrition Goals Re-Evaluation:   Nutrition Goals Discharge (Final Nutrition Goals Re-Evaluation):   Psychosocial: Target Goals: Acknowledge presence or absence of significant depression and/or stress, maximize coping skills, provide positive support system. Participant is able to verbalize types and ability to use techniques and skills needed for reducing stress and depression.  Initial Review &  Psychosocial Screening:  Initial Psych Review & Screening - 11/19/20 1313      Initial Review   Current issues with None Identified      Family Dynamics   Good Support System? Yes    Comments Her husband, her sons, and her sister all support her. She feels like she has a great support system. She has a son in [place] and another son in [place]. They speak daily and she sees them often.      Barriers   Psychosocial barriers to participate in program There are no identifiable barriers or psychosocial needs.      Screening Interventions   Interventions Encouraged to exercise    Expected Outcomes Long Term goal: The participant improves quality of Life and PHQ9 Scores as seen by post scores and/or verbalization of changes;Long Term Goal: Stressors or current issues are controlled or eliminated.           Quality of Life Scores:  Quality of Life - 11/19/20 1438      Quality of Life   Select Quality of Life      Quality of Life Scores   Health/Function Pre 18.5 %    Socioeconomic Pre 28.75 %    Family Pre 27.3 %    GLOBAL Pre 22.08 %          Scores of 19 and below usually indicate a poorer quality of life in these areas.  A difference of  2-3 points is a clinically meaningful difference.  A  difference of 2-3 points in the total score of the Quality of Life Index has been associated with significant improvement in overall quality of life, self-image, physical symptoms, and general health in studies assessing change in quality of life.   PHQ-9: Recent Review Flowsheet Data    Depression screen [facility] 2/9 11/19/2020 02/23/2012   Decreased Interest 0 0   Down, Depressed, Hopeless 0 0   PHQ - 2 Score 0 0   Altered sleeping 0 -   Tired, decreased energy 3  -   Change in appetite 0 -   Feeling bad or failure about yourself  0 -   Trouble concentrating 0 -   Moving slowly or fidgety/restless 0 -   Suicidal thoughts 0 -   PHQ-9 Score 3 -   Difficult doing work/chores Not difficult at all -     Interpretation of Total Score  Total Score Depression Severity:  1-4 = Minimal depression, 5-9 = Mild depression, 10-14 = Moderate depression, 15-19 = Moderately severe depression, 20-27 = Severe depression   Psychosocial Evaluation and Intervention:  Psychosocial Evaluation - 11/19/20 1446      Psychosocial Evaluation & Interventions   Interventions Encouraged to exercise with the program and follow exercise prescription    Comments Patient has no identifiable psychosocial issues.    Expected Outcomes Patient will continue to not have any identifiable psychosocial issues.    Continue Psychosocial Services  No Follow up required           Psychosocial Re-Evaluation:  Psychosocial Re-Evaluation    [place] Name 12/05/20 [phone] 01/02/21 8546           Psychosocial Re-Evaluation   Current issues with History of Depression -      Comments Patient is new to the program completeing 4 sessions. She does has a history of depression and is currently being treated with Citalopram 20 mg qd and Alprazolam 0.5 mg prn. Her inital QOL score was 22.08 and her  PhQ9 was 3. She feels her depression is managed. She also takes Ambien for sleep. She seems to enjoy coming to the sessions demonstrating a  positive attitude and is very interacitve with others in her class and the staff. We will continue to monitor. Patient has completed 10 sessions. She does has a history of depression which continues to be managed with Citalopram 20 mg qd and Alprazolam 0.5 mg prn. She feels her depression is managed. She also takes Ambien for sleep. She enjoys coming to the sessions and continues to demonstrat a positive attitude and is very interacitve with others in her class and the staff. We will continue to monitor.      Expected Outcomes Patient's depression will continue to be managed at discharge with no additional psychosocai issues identified. Patient's depression will continue to be managed at discharge with no additional psychosocai issues identified.      Interventions Relaxation education;Encouraged to attend Pulmonary Rehabilitation for the exercise;Stress management education Relaxation education;Encouraged to attend Pulmonary Rehabilitation for the exercise;Stress management education      Continue Psychosocial Services  No Follow up required No Follow up required             Psychosocial Discharge (Final Psychosocial Re-Evaluation):  Psychosocial Re-Evaluation - 01/02/21 0906      Psychosocial Re-Evaluation   Comments Patient has completed 10 sessions. She does has a history of depression which continues to be managed with Citalopram 20 mg qd and Alprazolam 0.5 mg prn. She feels her depression is managed. She also takes Ambien for sleep. She enjoys coming to the sessions and continues to demonstrat a positive attitude and is very interacitve with others in her class and the staff. We will continue to monitor.    Expected Outcomes Patient's depression will continue to be managed at discharge with no additional psychosocai issues identified.    Interventions Relaxation education;Encouraged to attend Pulmonary Rehabilitation for the exercise;Stress management education    Continue Psychosocial Services   No Follow up required            Education: Education Goals: Education classes will be provided on a weekly basis, covering required topics. Participant will state understanding/return demonstration of topics presented.  Learning Barriers/Preferences:  Learning Barriers/Preferences - 11/19/20 1315      Learning Barriers/Preferences   Learning Barriers None    Learning Preferences Audio;Written Material;Skilled Demonstration;Verbal Instruction;Video;Computer/Internet;Group Instruction;Individual Instruction;Pictoral           Education Topics: How Lungs Work and Diseases: - Discuss the anatomy of the lungs and diseases that can affect the lungs, such as COPD.   Exercise: -Discuss the importance of exercise, FITT principles of exercise, normal and abnormal responses to exercise, and how to exercise safely.   Environmental Irritants: -Discuss types of environmental irritants and how to limit exposure to environmental irritants.   Meds/Inhalers and oxygen: - Discuss respiratory medications, definition of an inhaler and oxygen, and the proper way to use an inhaler and oxygen.   Energy Saving Techniques: - Discuss methods to conserve energy and decrease shortness of breath when performing activities of daily living.  Flowsheet Row PULMONARY REHAB OTHER RESPIRATORY from 01/01/2021 in [place]  Date 11/27/20  Educator mk  Instruction Review Code 2- Demonstrated Understanding      Bronchial Hygiene / Breathing Techniques: - Discuss breathing mechanics, pursed-lip breathing technique,  proper posture, effective ways to clear airways, and other functional breathing techniques Flowsheet Row PULMONARY REHAB OTHER RESPIRATORY from 01/01/2021 in [place]  CARDIAC REHABILITATION  Date 12/04/20  Educator handout  Instruction Review Code 1- Verbalizes Understanding      Cleaning Equipment: - Provides group verbal and written instruction about the health  risks of elevated stress, cause of high stress, and healthy ways to reduce stress. Flowsheet Row PULMONARY REHAB OTHER RESPIRATORY from 01/01/2021 in [place]  Date 12/11/20  Educator Handout  Instruction Review Code 1- Verbalizes Understanding      Nutrition I: Fats: - Discuss the types of cholesterol, what cholesterol does to the body, and how cholesterol levels can be controlled. Flowsheet Row PULMONARY REHAB OTHER RESPIRATORY from 01/01/2021 in [place]  Date 12/18/20  Educator Handout  Instruction Review Code 1- Verbalizes Understanding      Nutrition II: Labels: -Discuss the different components of food labels and how to read food labels.   Respiratory Infections: - Discuss the signs and symptoms of respiratory infections, ways to prevent respiratory infections, and the importance of seeking medical treatment when having a respiratory infection. Flowsheet Row PULMONARY REHAB OTHER RESPIRATORY from 01/01/2021 in [place]  Date 01/01/21  Educator Handout  Instruction Review Code 1- Verbalizes Understanding      Stress I: Signs and Symptoms: - Discuss the causes of stress, how stress may lead to anxiety and depression, and ways to limit stress.   Stress II: Relaxation: -Discuss relaxation techniques to limit stress.   Oxygen for Home/Travel: - Discuss how to prepare for travel when on oxygen and proper ways to transport and store oxygen to ensure safety.   Knowledge Questionnaire Score:  Knowledge Questionnaire Score - 11/19/20 1318      Knowledge Questionnaire Score   Pre Score 15/18           Core Components/Risk Factors/Patient Goals at Admission:  Personal Goals and Risk Factors at Admission - 11/19/20 1318      Core Components/Risk Factors/Patient Goals on Admission    Weight Management Weight Loss;Yes    Intervention Weight Management: Develop a combined nutrition and exercise  program designed to reach desired caloric intake, while maintaining appropriate intake of nutrient and fiber, sodium and fats, and appropriate energy expenditure required for the weight goal.;Weight Management: Provide education and appropriate resources to help participant work on and attain dietary goals.;Weight Management/Obesity: Establish reasonable short term and long term weight goals.;Obesity: Provide education and appropriate resources to help participant work on and attain dietary goals.    Expected Outcomes Short Term: Continue to assess and modify interventions until short term weight is achieved;Long Term: Adherence to nutrition and physical activity/exercise program aimed toward attainment of established weight goal;Weight Maintenance: Understanding of the daily nutrition guidelines, which includes 25-35% calories from fat, 7% or less cal from saturated fats, less than 200mg  cholesterol, less than 1.5gm of sodium, & 5 or more servings of fruits and vegetables daily;Weight Loss: Understanding of general recommendations for a balanced deficit meal plan, which promotes 1-2 lb weight loss per week and includes a negative energy balance of [phone] kcal/d;Understanding recommendations for meals to include 15-35% energy as protein, 25-35% energy from fat, 35-60% energy from carbohydrates, less than 200mg  of dietary cholesterol, 20-35 gm of total fiber daily;Understanding of distribution of calorie intake throughout the day with the consumption of 4-5 meals/snacks;Weight Gain: Understanding of general recommendations for a high calorie, high protein meal plan that promotes weight gain by distributing calorie intake throughout the day with the consumption for 4-5 meals, snacks, and/or supplements    Improve shortness  of breath with ADL's Yes    Intervention Provide education, individualized exercise plan and daily activity instruction to help decrease symptoms of SOB with activities of daily living.     Expected Outcomes Short Term: Improve cardiorespiratory fitness to achieve a reduction of symptoms when performing ADLs;Long Term: Be able to perform more ADLs without symptoms or delay the onset of symptoms    Personal Goal Other Yes    Personal Goal To be able to come off of supplemental oxygen. She has been on it since she was hospitialized with COVID in July of 2021.    Intervention Exercise at rehab and begin a home exercise program.    Expected Outcomes She will be able to reduce her oxygen utilization following rehab.           Core Components/Risk Factors/Patient Goals Review:   Goals and Risk Factor Review    Row Name 12/05/20 7628 01/02/21 0907           Core Components/Risk Factors/Patient Goals Review   Personal Goals Review Weight Management/Obesity;Improve shortness of breath with ADL's Weight Management/Obesity;Improve shortness of breath with ADL's      Review Patient is new to the program completing 4 sessions losing 2 lbs since her initial visit. She was referred to pulmonary rehab due to SOB. She had COVID in July 21 abd ger SOB has increased since COVID. She was hospitalized for 9 days. Her personal goals for the program are to breathe better and return to her ADL's with less SOB. We will continue to monitor her progress as she works toward meeting these goals. Patient has completed 10 sessions losing 1 lb since her initial visit. She is doing well in the program with progression and consistent attendance. She continues to not require O2 during exercise with her O2 sat staying >95%. Her blood pressure is well controlled. Her personal goals for the program are to breathe better and to be able to return to her ADL's with less SOB. We will continue to monitor as she works toward meeting her personal goals.      Expected Outcomes Patient will complete the program meeting both personal and program goals. Patient will complete the program meeting both personal and program goals.              Core Components/Risk Factors/Patient Goals at Discharge (Final Review):   Goals and Risk Factor Review - 01/02/21 0907      Core Components/Risk Factors/Patient Goals Review   Personal Goals Review Weight Management/Obesity;Improve shortness of breath with ADL's    Review Patient has completed 10 sessions losing 1 lb since her initial visit. She is doing well in the program with progression and consistent attendance. She continues to not require O2 during exercise with her O2 sat staying >95%. Her blood pressure is well controlled. Her personal goals for the program are to breathe better and to be able to return to her ADL's with less SOB. We will continue to monitor as she works toward meeting her personal goals.    Expected Outcomes Patient will complete the program meeting both personal and program goals.           ITP Comments:   Comments: ITP REVIEW Pt is making expected progress toward pulmonary rehab goals after completing 12 sessions. Recommend continued exercise, life style modification, education, and utilization of breathing techniques to increase stamina and strength and decrease shortness of breath with exertion.

## 2021-01-08 NOTE — Progress Notes (Signed)
Daily Session Note  Patient Details  Name: Catherine Hancock MRN: [id number] Date of Birth: 06/07/1961 Referring Provider:   Flowsheet Row PULMONARY REHAB OTHER RESP ORIENTATION from 11/19/2020 in [place]  Referring Provider Dr. Chase Caller      Encounter Date: 01/08/2021  Check In:  Session Check In - 01/08/21 1045      Check-In   Supervising physician immediately available to respond to emergencies [facility] MD immediately available    Physician(s) Dr. Harl Bowie    Location [facility]    Staff Present Aundra Dubin, RN, BSN;Madison Audria Nine, MS, Exercise Physiologist;Dalton Kris Mouton, MS, ACSM-CEP, Exercise Physiologist    Virtual Visit No    Medication changes reported     No    Fall or balance concerns reported    No    Tobacco Cessation No Change    Warm-up and Cool-down Performed as group-led instruction    Resistance Training Performed Yes    VAD Patient? No    PAD/SET Patient? No      Pain Assessment   Currently in Pain? No/denies    Multiple Pain Sites No           Capillary Blood Glucose: No results found for this or any previous visit (from the past 24 hour(s)).    Social History   Tobacco Use  Smoking Status Never Smoker  Smokeless Tobacco Never Used    Goals Met:  Proper associated with RPD/PD & O2 Sat Independence with exercise equipment Improved SOB with ADL's Using PLB without cueing & demonstrates good technique Exercise tolerated well No report of cardiac concerns or symptoms Strength training completed today  Goals Unmet:  Not Applicable  Comments: Check out 1145.   [name] is Medical Director for [facility] Pulmonary Re[facility].

## 2021-01-12 ENCOUNTER — Encounter: Payer: Self-pay | Admitting: Cardiology

## 2021-01-13 ENCOUNTER — Encounter (HOSPITAL_COMMUNITY)
Admission: RE | Admit: 2021-01-13 | Discharge: 2021-01-13 | Disposition: A | Discharge status: Home / Self Care | Payer: BC Managed Care – PPO | Source: Ambulatory Visit | Attending: Internal Medicine | Admitting: Internal Medicine

## 2021-01-13 ENCOUNTER — Other Ambulatory Visit: Payer: Self-pay

## 2021-01-13 DIAGNOSIS — R0602 Shortness of breath: Secondary | ICD-10-CM | POA: Diagnosis not present

## 2021-01-13 NOTE — Progress Notes (Signed)
Daily Session Note  Patient Details  Name: Catherine Hancock MRN: [id number] Date of Birth: [birth year] Referring Provider:   Flowsheet Row PULMONARY REHAB OTHER RESP ORIENTATION from 11/19/2020 in [place]  Referring Provider Dr. Chase Caller      Encounter Date: 01/13/2021  Check In:  Session Check In - 01/13/21 1045      Check-In   Supervising physician immediately available to respond to emergencies [facility] MD immediately available    Physician(s) Dr. Harrington Challenger    Location [facility]    Staff Present [name], MS, Exercise Physiologist;Darcey Cardy Kris Mouton, MS, ACSM-CEP, Exercise Physiologist    Virtual Visit No    Medication changes reported     No    Fall or balance concerns reported    No    Tobacco Cessation No Change    Warm-up and Cool-down Performed as group-led instruction    Resistance Training Performed Yes    VAD Patient? No    PAD/SET Patient? No      Pain Assessment   Currently in Pain? No/denies    Multiple Pain Sites No           Capillary Blood Glucose: No results found for this or any previous visit (from the past 24 hour(s)).    Social History   Tobacco Use  Smoking Status Never Smoker  Smokeless Tobacco Never Used    Goals Met:  Independence with exercise equipment Exercise tolerated well No report of cardiac concerns or symptoms Strength training completed today  Goals Unmet:  Not Applicable  Comments: checkout time is 1045   [name] is Medical Director for [facility] Pulmonary Rehab.

## 2021-01-15 ENCOUNTER — Encounter (HOSPITAL_COMMUNITY)
Admission: RE | Admit: 2021-01-15 | Discharge: 2021-01-15 | Disposition: A | Discharge status: Home / Self Care | Payer: BC Managed Care – PPO | Source: Ambulatory Visit | Attending: Internal Medicine | Admitting: Internal Medicine

## 2021-01-15 ENCOUNTER — Other Ambulatory Visit: Payer: Self-pay

## 2021-01-15 DIAGNOSIS — R0602 Shortness of breath: Secondary | ICD-10-CM | POA: Diagnosis not present

## 2021-01-15 NOTE — Progress Notes (Signed)
Daily Session Note  Patient Details  Name: Catherine Hancock MRN: [id number] Date of Birth: 30-Aug-1961 Referring Provider:   Flowsheet Row PULMONARY REHAB OTHER RESP ORIENTATION from 11/19/2020 in [place]  Referring Provider Dr. Chase Caller      Encounter Date: 01/15/2021  Check In:  Session Check In - 01/15/21 1045      Check-In   Supervising physician immediately available to respond to emergencies [facility] MD immediately available    Physician(s) Dr. Domenic Polite    Location [facility]    Staff Present Aundra Dubin, RN, BSN;Madison Audria Nine, MS, Exercise Physiologist    Virtual Visit No    Medication changes reported     No    Fall or balance concerns reported    No    Tobacco Cessation No Change    Warm-up and Cool-down Performed as group-led instruction    Resistance Training Performed Yes    VAD Patient? No    PAD/SET Patient? No      Pain Assessment   Currently in Pain? No/denies    Multiple Pain Sites No           Capillary Blood Glucose: No results found for this or any previous visit (from the past 24 hour(s)).    Social History   Tobacco Use  Smoking Status Never Smoker  Smokeless Tobacco Never Used    Goals Met:  Proper associated with RPD/PD & O2 Sat Independence with exercise equipment Improved SOB with ADL's Using PLB without cueing & demonstrates good technique Exercise tolerated well No report of cardiac concerns or symptoms Strength training completed today  Goals Unmet:  Not Applicable  Comments: Check out 1145.   [name] is Medical Director for [facility] Pulmonary Rehab.

## 2021-01-20 ENCOUNTER — Encounter (HOSPITAL_COMMUNITY): Payer: BC Managed Care – PPO

## 2021-01-22 ENCOUNTER — Encounter (HOSPITAL_COMMUNITY): Payer: BC Managed Care – PPO

## 2021-01-27 ENCOUNTER — Encounter (HOSPITAL_COMMUNITY): Payer: BC Managed Care – PPO

## 2021-01-27 NOTE — Telephone Encounter (Signed)
Sorry missed it. I would prefer a tele visit with myself or an app so we best understand surgery and risk stratify even thought I just saw patient a month ago

## 2021-01-29 ENCOUNTER — Encounter (HOSPITAL_COMMUNITY)
Admission: RE | Admit: 2021-01-29 | Discharge: 2021-01-29 | Disposition: A | Discharge status: Home / Self Care | Payer: BC Managed Care – PPO | Source: Ambulatory Visit | Attending: Internal Medicine | Admitting: Internal Medicine

## 2021-01-29 ENCOUNTER — Other Ambulatory Visit: Payer: Self-pay

## 2021-01-29 DIAGNOSIS — R0602 Shortness of breath: Secondary | ICD-10-CM

## 2021-01-29 NOTE — Telephone Encounter (Signed)
Attempted to call pt to see if patient still needed this clearance - no answer and no vm -pr

## 2021-01-29 NOTE — Progress Notes (Signed)
Daily Session Note  Patient Details  Name: Catherine Hancock MRN: [id number] Date of Birth: 16-Jun-1961 Referring Provider:   Flowsheet Row PULMONARY REHAB OTHER RESP ORIENTATION from 11/19/2020 in [place]  Referring Provider Dr. Chase Caller      Encounter Date: 01/29/2021  Check In:  Session Check In - 01/29/21 1045      Check-In   Supervising physician immediately available to respond to emergencies [facility] MD immediately available    Physician(s) Dr. Domenic Polite    Location [facility]    Staff Present [name], MS, Exercise Physiologist;Armetta Henri Kris Mouton, MS, ACSM-CEP, Exercise Physiologist    Virtual Visit No    Medication changes reported     No    Fall or balance concerns reported    No    Tobacco Cessation No Change    Warm-up and Cool-down Performed as group-led instruction    Resistance Training Performed Yes    VAD Patient? No    PAD/SET Patient? No      Pain Assessment   Currently in Pain? No/denies    Multiple Pain Sites No           Capillary Blood Glucose: No results found for this or any previous visit (from the past 24 hour(s)).    Social History   Tobacco Use  Smoking Status Never Smoker  Smokeless Tobacco Never Used    Goals Met:  Independence with exercise equipment Exercise tolerated well No report of cardiac concerns or symptoms Strength training completed today  Goals Unmet:  Not Applicable  Comments: checkout time is 1145   [name] is Medical Director for [facility].

## 2021-02-03 ENCOUNTER — Encounter (HOSPITAL_COMMUNITY)
Admission: RE | Admit: 2021-02-03 | Discharge: 2021-02-03 | Disposition: A | Discharge status: Home / Self Care | Payer: BC Managed Care – PPO | Source: Ambulatory Visit | Attending: Internal Medicine | Admitting: Internal Medicine

## 2021-02-03 ENCOUNTER — Other Ambulatory Visit: Payer: Self-pay

## 2021-02-03 VITALS — Wt 159.2 lb

## 2021-02-03 DIAGNOSIS — R0602 Shortness of breath: Secondary | ICD-10-CM

## 2021-02-03 NOTE — Progress Notes (Signed)
Daily Session Note  Patient Details  Name: Catherine Hancock MRN: [id number] Date of Birth: [birth year] Referring Provider:   Flowsheet Row PULMONARY REHAB OTHER RESP ORIENTATION from 11/19/2020 in [place]  Referring Provider Dr. Chase Hancock      Encounter Date: 02/03/2021  Check In:  Session Check In - 02/03/21 1045      Check-In   Supervising physician immediately available to respond to emergencies [facility] MD immediately available    Physician(s) Dr. Harl Hancock    Location [facility]    Staff Present Catherine Harsh, MS, Exercise Physiologist;Catherine Hancock Catherine Mouton, MS, ACSM-CEP, Exercise Physiologist    Virtual Visit No    Medication changes reported     No    Fall or balance concerns reported    No    Tobacco Cessation No Change    Warm-up and Cool-down Performed as group-led instruction    Resistance Training Performed Yes    VAD Patient? No    PAD/SET Patient? No      Pain Assessment   Currently in Pain? No/denies    Multiple Pain Sites No           Capillary Blood Glucose: No results found for this or any previous visit (from the past 24 hour(s)).    Social History   Tobacco Use  Smoking Status Never Smoker  Smokeless Tobacco Never Used    Goals Met:  Independence with exercise equipment Exercise tolerated well No report of cardiac concerns or symptoms Strength training completed today  Goals Unmet:  Not Applicable  Comments: checkout time is 1145   Dr. Kathie Hancock is Medical Director for [facility].

## 2021-02-04 NOTE — Progress Notes (Signed)
Pulmonary Individual Treatment Plan  Patient Details  Name: Catherine Hancock MRN: [id number] Date of Birth: [birth year] Referring Provider:   Flowsheet Row PULMONARY REHAB OTHER RESP ORIENTATION from 11/19/2020 in [facility]  Referring Provider Dr. Chase Caller      Initial Encounter Date:  Flowsheet Row PULMONARY REHAB OTHER RESP ORIENTATION from 11/19/2020 in [facility]  Date 11/19/20      Visit Diagnosis: Shortness of breath  Patient's Home Medications on Admission:   Current Outpatient Medications:  .  albuterol (ACCUNEB) 0.63 MG/3ML nebulizer solution, Take by nebulization 3 (three) times daily., Disp: , Rfl:  .  albuterol (VENTOLIN HFA) 108 (90 Base) MCG/ACT inhaler, Inhale 2 puffs into the lungs every 6 (six) hours., Disp: 8 g, Rfl: 0 .  ALPRAZolam (XANAX) 0.5 MG tablet, Take 0.25 mg by mouth as directed. PRN, Disp: , Rfl:  .  aspirin EC 81 MG tablet, Take 1 tablet (81 mg total) by mouth daily., Disp: , Rfl:  .  azelastine (ASTELIN) 0.1 % nasal spray, Place 2 sprays into the nose 4 (four) times daily as needed., Disp: , Rfl:  .  BIOTIN PO, Take 1 tablet by mouth as directed., Disp: , Rfl:  .  budesonide-formoterol (SYMBICORT) 160-4.5 MCG/ACT inhaler, Inhale 2 puffs into the lungs 2 (two) times daily., Disp: 1 each, Rfl: 6 .  budesonide-formoterol (SYMBICORT) 160-4.5 MCG/ACT inhaler, Inhale 2 puffs into the lungs 2 (two) times daily., Disp: , Rfl:  .  Calcium Carb-Cholecalciferol (CALCIUM 600+D3) 600-800 MG-UNIT TABS, Take 1 tablet by mouth daily., Disp: , Rfl:  .  Cholecalciferol (VITAMIN D) 50 MCG (2000 UT) CAPS, Take 1 capsule by mouth daily with breakfast. Takes in addition to weekly ergocalciferol, Disp: , Rfl:  .  Cholecalciferol (VITAMIN D3 PO), Take 25,000 Units by mouth once a week., Disp: , Rfl:  .  citalopram (CELEXA) 20 MG tablet, Take 20 mg by mouth daily. , Disp: , Rfl:  .  cyclobenzaprine (FLEXERIL) 10 MG tablet, TAKE 1  TABLET THREE TIMES A DAY AS NEEDED FOR MUSCLE SPASMS (Patient taking differently: Take 10 mg by mouth at bedtime.), Disp: 90 tablet, Rfl: 0 .  diclofenac (VOLTAREN) 50 MG EC tablet, Take 1 tablet by mouth 2 (two) times daily., Disp: , Rfl:  .  DONNATAL 16.2 MG tablet, Take 16.2 mg by mouth daily as needed (Stomach pain). , Disp: , Rfl:  .  EPINEPHrine 0.3 mg/0.3 mL IJ SOAJ injection, Inject 0.3 mg into the muscle as needed for anaphylaxis. , Disp: , Rfl:  .  esomeprazole (NEXIUM) 40 MG capsule, Take 40 mg by mouth 2 (two) times daily before a meal. , Disp: , Rfl:  .  HYDROcodone-acetaminophen (NORCO/VICODIN) 5-325 MG tablet, Take 1 tablet by mouth every 6 (six) hours as needed for moderate pain., Disp: , Rfl:  .  ibuprofen (ADVIL,MOTRIN) 800 MG tablet, Take 800 mg by mouth every 8 (eight) hours as needed (pain)., Disp: , Rfl:  .  levocetirizine (XYZAL) 5 MG tablet, Take 1 tablet by mouth daily., Disp: , Rfl:  .  levothyroxine (SYNTHROID) 50 MCG tablet, Take 50 mcg by mouth daily. , Disp: , Rfl:  .  lubiprostone (AMITIZA) 24 MCG capsule, Take 1 capsule by mouth 2 (two) times daily., Disp: , Rfl:  .  meclizine (ANTIVERT) 25 MG tablet, Take 1 tablet by mouth 3 (three) times daily as needed., Disp: , Rfl:  .  montelukast (SINGULAIR) 10 MG tablet, Take 10 mg by mouth at  bedtime., Disp: , Rfl:  .  nitroGLYCERIN (NITROSTAT) 0.4 MG SL tablet, Place 1 tablet (0.4 mg total) under the tongue every 5 (five) minutes as needed for chest pain., Disp: 25 tablet, Rfl: 12 .  polyethylene glycol powder (GLYCOLAX/MIRALAX) powder, Take 17 g by mouth as needed for mild constipation. , Disp: , Rfl:  .  predniSONE (DELTASONE) 10 MG tablet, 4 x 2 days, 3 x 2 days, 2 x 2 days, 1 x 2 days, then stop, Disp: 20 tablet, Rfl: 0 .  Prenatal Vit-Fe Fumarate-FA (M-NATAL PLUS) 27-1 MG TABS, Take 1 tablet by mouth at bedtime., Disp: , Rfl:  .  topiramate (TOPAMAX) 50 MG tablet, Take 50-100 mg by mouth See admin instructions. 50mg  in  the morning and 100mg  by mouth at night, Disp: , Rfl:  .  TRULANCE 3 MG TABS, Take 1 tablet by mouth daily., Disp: , Rfl:  .  Vitamin D, Ergocalciferol, (DRISDOL) 1.25 MG (50000 UNIT) CAPS capsule, Take 50,000 Units by mouth once a week., Disp: , Rfl:  .  zinc gluconate 50 MG tablet, Take 50 mg by mouth daily., Disp: , Rfl:  .  zolpidem (AMBIEN) 10 MG tablet, Take 0.5 tablets by mouth at bedtime as needed for sleep. , Disp: , Rfl:   Past Medical History: Past Medical History:  Diagnosis Date  . Asthma   . Asthma   . Chest pain    LHC 7/08: EF 60%, normal coronaries; ETT-Echo 6/11: EF 55-60%, no ischemia  . Chronic back pain   . CTS (carpal tunnel syndrome)    bil  . CTS (carpal tunnel syndrome)   . Depression 05/28/2020  . Hypothyroidism   . IBS (irritable bowel syndrome)   . Internal hemorrhoids   . Migraines   . OSA on CPAP 11/13/2017   Mild with AHI 13/hr   . Pulmonary nodule, right     Tobacco Use: Social History   Tobacco Use  Smoking Status Never Smoker  Smokeless Tobacco Never Used    Labs: Recent Review Scientist, physiological    Labs for ITP Cardiac and Pulmonary Rehab Latest Ref Rng & Units 05/28/2020   Trlycerides <150 mg/dL 123      Capillary Blood Glucose: No results found for: GLUCAP   Pulmonary Assessment Scores:  Pulmonary Assessment Scores    Row Name 11/19/20 1321         ADL UCSD   ADL Phase Entry     SOB Score total 59           CAT Score   CAT Score 22           mMRC Score   mMRC Score 2           UCSD: Self-administered rating of dyspnea associated with activities of daily living (ADLs) 6-point scale (0 = "not at all" to 5 = "maximal or unable to do because of breathlessness")  Scoring Scores range from 0 to 120.  Minimally important difference is 5 units  CAT: CAT can identify the health impairment of COPD patients and is better correlated with disease progression.  CAT has a scoring range of zero to 40. The CAT score is classified  into four groups of low (less than 10), medium (10 - 20), high (21-30) and very high (31-40) based on the impact level of disease on health status. A CAT score over 10 suggests significant symptoms.  A worsening CAT score could be explained by an exacerbation, poor medication adherence, poor inhaler  technique, or progression of COPD or comorbid conditions.  CAT MCID is 2 points  mMRC: mMRC (Modified Medical Research Council) Dyspnea Scale is used to assess the degree of baseline functional disability in patients of respiratory disease due to dyspnea. No minimal important difference is established. A decrease in score of 1 point or greater is considered a positive change.   Pulmonary Function Assessment:  Pulmonary Function Assessment - 11/19/20 1310      Breath   Shortness of Breath Yes;Limiting activity           Exercise Target Goals: Exercise Program Goal: Individual exercise prescription set using results from initial 6 min walk test and THRR while considering  patient's activity barriers and safety.   Exercise Prescription Goal: Initial exercise prescription builds to 30-45 minutes a day of aerobic activity, 2-3 days per week.  Home exercise guidelines will be given to patient during program as part of exercise prescription that the participant will acknowledge.  Activity Barriers & Risk Stratification:  Activity Barriers & Cardiac Risk Stratification - 11/19/20 1311      Activity Barriers & Cardiac Risk Stratification   Activity Barriers Deconditioning;Shortness of Breath;Chest Pain/Angina    Cardiac Risk Stratification Low           6 Minute Walk:  6 Minute Walk    Row Name 11/19/20 1433         6 Minute Walk   Phase Initial     Distance 1400 feet     Walk Time 6 minutes     # of Rest Breaks 0     MPH 3.2     METS 3.92     RPE 11     Perceived Dyspnea  11     VO2 Peak 13.72     Symptoms No     Resting HR 75 bpm     Resting BP 112/72     Resting Oxygen  Saturation  96 %     Exercise Oxygen Saturation  during 6 min walk 96 %     Max Ex. HR 102 bpm     Max Ex. BP 130/78     2 Minute Post BP 120/80           Interval HR   1 Minute HR 94     2 Minute HR 98     3 Minute HR 101     4 Minute HR 101     5 Minute HR 102     6 Minute HR 101     2 Minute Post HR 86     Interval Heart Rate? Yes           Interval Oxygen   Interval Oxygen? Yes     Baseline Oxygen Saturation % 96 %     1 Minute Oxygen Saturation % 96 %     1 Minute Liters of Oxygen 0 L     2 Minute Oxygen Saturation % 97 %     2 Minute Liters of Oxygen 0 L     3 Minute Oxygen Saturation % 97 %     3 Minute Liters of Oxygen 0 L     4 Minute Oxygen Saturation % 97 %     4 Minute Liters of Oxygen 0 L     5 Minute Oxygen Saturation % 96 %     5 Minute Liters of Oxygen 0 L     6 Minute Oxygen Saturation % 96 %  6 Minute Liters of Oxygen 0 L     2 Minute Post Oxygen Saturation % 98 %     2 Minute Post Liters of Oxygen 0 L            Oxygen Initial Assessment:  Oxygen Initial Assessment - 11/19/20 1444      Initial 6 min Walk   Oxygen Used None      Program Oxygen Prescription   Program Oxygen Prescription None   she has been using 2 L PRN since she got out of the hospital in July of 2020 but did not require it during her walk test. We will start her on RA and monitor her levels.     Intervention   Short Term Goals To learn and exhibit compliance with exercise, home and travel O2 prescription;To learn and understand importance of monitoring SPO2 with pulse oximeter and demonstrate accurate use of the pulse oximeter.;To learn and understand importance of maintaining oxygen saturations>88%;To learn and demonstrate proper pursed lip breathing techniques or other breathing techniques.;To learn and demonstrate proper use of respiratory medications    Long  Term Goals Exhibits compliance with exercise, home and travel O2 prescription;Maintenance of O2  saturations>88%;Verbalizes importance of monitoring SPO2 with pulse oximeter and return demonstration;Compliance with respiratory medication;Demonstrates proper use of MDI's;Exhibits proper breathing techniques, such as pursed lip breathing or other method taught during program session           Oxygen Re-Evaluation:  Oxygen Re-Evaluation    Row Name 12/09/20 1230 01/06/21 1216 02/03/21 1210         Program Oxygen Prescription   Program Oxygen Prescription None None None           Home Oxygen   Home Oxygen Device Home Concentrator;E-Tanks Home Concentrator;E-Tanks --     Sleep Oxygen Prescription CPAP CPAP --     Liters per minute 6 6 --     Home Exercise Oxygen Prescription Continuous Continuous None     Liters per minute 2 2 --     Home Resting Oxygen Prescription None None --     Compliance with Home Oxygen Use Yes Yes --           Goals/Expected Outcomes   Short Term Goals To learn and exhibit compliance with exercise, home and travel O2 prescription;To learn and understand importance of monitoring SPO2 with pulse oximeter and demonstrate accurate use of the pulse oximeter.;To learn and understand importance of maintaining oxygen saturations>88%;To learn and demonstrate proper pursed lip breathing techniques or other breathing techniques.;To learn and demonstrate proper use of respiratory medications To learn and exhibit compliance with exercise, home and travel O2 prescription;To learn and understand importance of monitoring SPO2 with pulse oximeter and demonstrate accurate use of the pulse oximeter.;To learn and understand importance of maintaining oxygen saturations>88%;To learn and demonstrate proper pursed lip breathing techniques or other breathing techniques.;To learn and demonstrate proper use of respiratory medications To learn and exhibit compliance with exercise, home and travel O2 prescription;To learn and understand importance of monitoring SPO2 with pulse oximeter and  demonstrate accurate use of the pulse oximeter.;To learn and understand importance of maintaining oxygen saturations>88%;To learn and demonstrate proper pursed lip breathing techniques or other breathing techniques.;To learn and demonstrate proper use of respiratory medications     Long  Term Goals Exhibits compliance with exercise, home and travel O2 prescription;Maintenance of O2 saturations>88%;Verbalizes importance of monitoring SPO2 with pulse oximeter and return demonstration;Compliance with respiratory medication;Demonstrates proper use of MDI's;Exhibits proper  breathing techniques, such as pursed lip breathing or other method taught during program session Exhibits compliance with exercise, home and travel O2 prescription;Maintenance of O2 saturations>88%;Verbalizes importance of monitoring SPO2 with pulse oximeter and return demonstration;Compliance with respiratory medication;Demonstrates proper use of MDI's;Exhibits proper breathing techniques, such as pursed lip breathing or other method taught during program session Exhibits compliance with exercise, home and travel O2 prescription;Maintenance of O2 saturations>88%;Verbalizes importance of monitoring SPO2 with pulse oximeter and return demonstration;Compliance with respiratory medication;Demonstrates proper use of MDI's;Exhibits proper breathing techniques, such as pursed lip breathing or other method taught during program session            Oxygen Discharge (Final Oxygen Re-Evaluation):  Oxygen Re-Evaluation - 02/03/21 1210      Program Oxygen Prescription   Program Oxygen Prescription None      Home Oxygen   Home Exercise Oxygen Prescription None      Goals/Expected Outcomes   Short Term Goals To learn and exhibit compliance with exercise, home and travel O2 prescription;To learn and understand importance of monitoring SPO2 with pulse oximeter and demonstrate accurate use of the pulse oximeter.;To learn and understand importance of  maintaining oxygen saturations>88%;To learn and demonstrate proper pursed lip breathing techniques or other breathing techniques.;To learn and demonstrate proper use of respiratory medications    Long  Term Goals Exhibits compliance with exercise, home and travel O2 prescription;Maintenance of O2 saturations>88%;Verbalizes importance of monitoring SPO2 with pulse oximeter and return demonstration;Compliance with respiratory medication;Demonstrates proper use of MDI's;Exhibits proper breathing techniques, such as pursed lip breathing or other method taught during program session           Initial Exercise Prescription:  Initial Exercise Prescription - 11/19/20 1400      Date of Initial Exercise RX and Referring Provider   Date 11/19/20    Referring Provider Dr. Chase Caller    Expected Discharge Date 03/19/21      NuStep   Level 1    SPM 80    Minutes 22      Arm Ergometer   Level 1    RPM 60    Minutes 22      Intensity   THRR 40-80% of Max Heartrate 64-129    Ratings of Perceived Exertion 11-13    Perceived Dyspnea 0-4      Progression   Progression Continue to progress workloads to maintain intensity without signs/symptoms of physical distress.      Resistance Training   Training Prescription Yes    Weight 2    Reps 10-15           Perform Capillary Blood Glucose checks as needed.  Exercise Prescription Changes:   Exercise Prescription Changes    Row Name 11/25/20 1100 12/02/20 1600 12/09/20 1200 12/23/20 1200 01/06/21 1200     Response to Exercise   Blood Pressure (Admit) 128/70 -- 134/80 120/72 116/56   Blood Pressure (Exercise) 128/84 -- 125/70 118/64 128/60   Blood Pressure (Exit) 114/80 -- 118/64 114/78 110/72   Heart Rate (Admit) 103 bpm -- 92 bpm 67 bpm 104 bpm   Heart Rate (Exercise) 102 bpm -- 110 bpm 87 bpm 114 bpm   Heart Rate (Exit) 97 bpm -- 100 bpm 77 bpm 95 bpm   Oxygen Saturation (Admit) 100 % -- 99 % 98 % 99 %   Oxygen Saturation (Exercise)  97 % -- 95 % 97 % 94 %   Oxygen Saturation (Exit) 99 % -- 98 % 98 % 98 %  Rating of Perceived Exertion (Exercise) 11 -- 12 12 15    Perceived Dyspnea (Exercise) 12 -- 12 11 11    Duration Continue with 30 min of aerobic exercise without signs/symptoms of physical distress. -- Continue with 30 min of aerobic exercise without signs/symptoms of physical distress. Continue with 30 min of aerobic exercise without signs/symptoms of physical distress. Continue with 30 min of aerobic exercise without signs/symptoms of physical distress.   Intensity THRR unchanged -- THRR unchanged THRR unchanged THRR unchanged     Progression   Progression Continue to progress workloads to maintain intensity without signs/symptoms of physical distress. -- Continue to progress workloads to maintain intensity without signs/symptoms of physical distress. Continue to progress workloads to maintain intensity without signs/symptoms of physical distress. Continue to progress workloads to maintain intensity without signs/symptoms of physical distress.     Resistance Training   Training Prescription Yes -- Yes Yes Yes   Weight 2 -- 2 lbs 3 lbs 3 lbs   Reps 10-15 -- 10-15 10-15 10-15   Time 10 Minutes -- 10 Minutes 10 Minutes 10 Minutes     NuStep   Level 1 -- 1 2 2    SPM 75 -- 89 112 108   Minutes 39 -- 39 39 39   METs 1.6 -- 2.2 2.9 2.7     Home Exercise Plan   Plans to continue exercise at -- Home (comment) -- -- --   Frequency -- Add 2 additional days to program exercise sessions. -- -- --   Initial Home Exercises Provided -- 12/02/20 -- -- --   [place] Name 01/20/21 1500 02/03/21 1200           Response to Exercise   Blood Pressure (Admit) 140/70 130/70      Blood Pressure (Exercise) 128/74 166/64      Blood Pressure (Exit) 120/66 118/80      Heart Rate (Admit) 110 bpm 111 bpm      Heart Rate (Exercise) 103 bpm 108 bpm      Heart Rate (Exit) 95 bpm 96 bpm      Oxygen Saturation (Admit) 96 % 98 %      Oxygen  Saturation (Exercise) 97 % 98 %      Oxygen Saturation (Exit) 97 % 98 %      Rating of Perceived Exertion (Exercise) 13 12      Perceived Dyspnea (Exercise) 13 11      Duration Continue with 30 min of aerobic exercise without signs/symptoms of physical distress. Continue with 30 min of aerobic exercise without signs/symptoms of physical distress.      Intensity THRR unchanged THRR unchanged             Progression   Progression Continue to progress workloads to maintain intensity without signs/symptoms of physical distress. Continue to progress workloads to maintain intensity without signs/symptoms of physical distress.             Resistance Training   Training Prescription Yes Yes      Weight 3 lbs 3 lbs      Reps 10-15 10-15      Time 10 Minutes 10 Minutes             NuStep   Level 2 2      SPM 112 96      Minutes 39 39      METs 3 2.1             Exercise Comments:   Exercise  Comments    Row Name 11/25/20 1154 12/02/20 1603         Exercise Comments Patient completed first exercise session today. She said she doesnt really exercise so it was different for her, but she enjoyed it and is excited to come back. She tolerated exercise well today. Reviewed home exercise program with patient. She is not currently exercicing at home and plans to start walking.             Exercise Goals and Review:   Exercise Goals    Row Name 11/19/20 1437 12/09/20 1227 01/06/21 1213 02/03/21 1212       Exercise Goals   Increase Physical Activity Yes Yes Yes Yes    Intervention Develop an individualized exercise prescription for aerobic and resistive training based on initial evaluation findings, risk stratification, comorbidities and participant's personal goals.;Provide advice, education, support and counseling about physical activity/exercise needs. Develop an individualized exercise prescription for aerobic and resistive training based on initial evaluation findings, risk  stratification, comorbidities and participant's personal goals.;Provide advice, education, support and counseling about physical activity/exercise needs. Develop an individualized exercise prescription for aerobic and resistive training based on initial evaluation findings, risk stratification, comorbidities and participant's personal goals.;Provide advice, education, support and counseling about physical activity/exercise needs. Develop an individualized exercise prescription for aerobic and resistive training based on initial evaluation findings, risk stratification, comorbidities and participant's personal goals.;Provide advice, education, support and counseling about physical activity/exercise needs.    Expected Outcomes Short Term: Attend rehab on a regular basis to increase amount of physical activity.;Long Term: Add in home exercise to make exercise part of routine and to increase amount of physical activity.;Long Term: Exercising regularly at least 3-5 days a week. Short Term: Attend rehab on a regular basis to increase amount of physical activity.;Long Term: Add in home exercise to make exercise part of routine and to increase amount of physical activity.;Long Term: Exercising regularly at least 3-5 days a week. Short Term: Attend rehab on a regular basis to increase amount of physical activity.;Long Term: Add in home exercise to make exercise part of routine and to increase amount of physical activity.;Long Term: Exercising regularly at least 3-5 days a week. Short Term: Attend rehab on a regular basis to increase amount of physical activity.;Long Term: Add in home exercise to make exercise part of routine and to increase amount of physical activity.;Long Term: Exercising regularly at least 3-5 days a week.    Increase Strength and Stamina Yes Yes Yes Yes    Intervention Provide advice, education, support and counseling about physical activity/exercise needs.;Develop an individualized exercise  prescription for aerobic and resistive training based on initial evaluation findings, risk stratification, comorbidities and participant's personal goals. Provide advice, education, support and counseling about physical activity/exercise needs.;Develop an individualized exercise prescription for aerobic and resistive training based on initial evaluation findings, risk stratification, comorbidities and participant's personal goals. Provide advice, education, support and counseling about physical activity/exercise needs.;Develop an individualized exercise prescription for aerobic and resistive training based on initial evaluation findings, risk stratification, comorbidities and participant's personal goals. Provide advice, education, support and counseling about physical activity/exercise needs.;Develop an individualized exercise prescription for aerobic and resistive training based on initial evaluation findings, risk stratification, comorbidities and participant's personal goals.    Expected Outcomes Short Term: Increase workloads from initial exercise prescription for resistance, speed, and METs.;Short Term: Perform resistance training exercises routinely during rehab and add in resistance training at home;Long Term: Improve cardiorespiratory fitness, muscular endurance and strength  as measured by increased METs and functional capacity (6MWT) Short Term: Increase workloads from initial exercise prescription for resistance, speed, and METs.;Short Term: Perform resistance training exercises routinely during rehab and add in resistance training at home;Long Term: Improve cardiorespiratory fitness, muscular endurance and strength as measured by increased METs and functional capacity (6MWT) Short Term: Increase workloads from initial exercise prescription for resistance, speed, and METs.;Short Term: Perform resistance training exercises routinely during rehab and add in resistance training at home;Long Term: Improve  cardiorespiratory fitness, muscular endurance and strength as measured by increased METs and functional capacity (6MWT) Short Term: Increase workloads from initial exercise prescription for resistance, speed, and METs.;Short Term: Perform resistance training exercises routinely during rehab and add in resistance training at home;Long Term: Improve cardiorespiratory fitness, muscular endurance and strength as measured by increased METs and functional capacity (6MWT)    Able to understand and use rate of perceived exertion (RPE) scale Yes Yes Yes Yes    Intervention Provide education and explanation on how to use RPE scale Provide education and explanation on how to use RPE scale Provide education and explanation on how to use RPE scale Provide education and explanation on how to use RPE scale    Expected Outcomes Short Term: Able to use RPE daily in rehab to express subjective intensity level;Long Term:  Able to use RPE to guide intensity level when exercising independently Short Term: Able to use RPE daily in rehab to express subjective intensity level;Long Term:  Able to use RPE to guide intensity level when exercising independently Short Term: Able to use RPE daily in rehab to express subjective intensity level;Long Term:  Able to use RPE to guide intensity level when exercising independently Short Term: Able to use RPE daily in rehab to express subjective intensity level;Long Term:  Able to use RPE to guide intensity level when exercising independently    Able to understand and use Dyspnea scale Yes Yes Yes Yes    Intervention Provide education and explanation on how to use Dyspnea scale Provide education and explanation on how to use Dyspnea scale Provide education and explanation on how to use Dyspnea scale Provide education and explanation on how to use Dyspnea scale    Expected Outcomes Short Term: Able to use Dyspnea scale daily in rehab to express subjective sense of shortness of breath during  exertion;Long Term: Able to use Dyspnea scale to guide intensity level when exercising independently Short Term: Able to use Dyspnea scale daily in rehab to express subjective sense of shortness of breath during exertion;Long Term: Able to use Dyspnea scale to guide intensity level when exercising independently Short Term: Able to use Dyspnea scale daily in rehab to express subjective sense of shortness of breath during exertion;Long Term: Able to use Dyspnea scale to guide intensity level when exercising independently Short Term: Able to use Dyspnea scale daily in rehab to express subjective sense of shortness of breath during exertion;Long Term: Able to use Dyspnea scale to guide intensity level when exercising independently    Knowledge and understanding of Target Heart Rate Range (THRR) Yes Yes Yes Yes    Intervention Provide education and explanation of THRR including how the numbers were predicted and where they are located for reference Provide education and explanation of THRR including how the numbers were predicted and where they are located for reference Provide education and explanation of THRR including how the numbers were predicted and where they are located for reference Provide education and explanation of THRR including  how the numbers were predicted and where they are located for reference    Expected Outcomes Short Term: Able to state/look up THRR;Long Term: Able to use THRR to govern intensity when exercising independently;Short Term: Able to use daily as guideline for intensity in rehab Short Term: Able to state/look up THRR;Long Term: Able to use THRR to govern intensity when exercising independently;Short Term: Able to use daily as guideline for intensity in rehab Short Term: Able to state/look up THRR;Long Term: Able to use THRR to govern intensity when exercising independently;Short Term: Able to use daily as guideline for intensity in rehab Short Term: Able to state/look up THRR;Long  Term: Able to use THRR to govern intensity when exercising independently;Short Term: Able to use daily as guideline for intensity in rehab    Able to check pulse independently Yes Yes Yes Yes    Intervention Provide education and demonstration on how to check pulse in carotid and radial arteries.;Review the importance of being able to check your own pulse for safety during independent exercise Provide education and demonstration on how to check pulse in carotid and radial arteries.;Review the importance of being able to check your own pulse for safety during independent exercise Provide education and demonstration on how to check pulse in carotid and radial arteries.;Review the importance of being able to check your own pulse for safety during independent exercise Provide education and demonstration on how to check pulse in carotid and radial arteries.;Review the importance of being able to check your own pulse for safety during independent exercise    Expected Outcomes Short Term: Able to explain why pulse checking is important during independent exercise;Long Term: Able to check pulse independently and accurately Short Term: Able to explain why pulse checking is important during independent exercise;Long Term: Able to check pulse independently and accurately Short Term: Able to explain why pulse checking is important during independent exercise;Long Term: Able to check pulse independently and accurately Short Term: Able to explain why pulse checking is important during independent exercise;Long Term: Able to check pulse independently and accurately    Understanding of Exercise Prescription Yes Yes Yes Yes    Intervention Provide education, explanation, and written materials on patient's individual exercise prescription Provide education, explanation, and written materials on patient's individual exercise prescription Provide education, explanation, and written materials on patient's individual exercise  prescription Provide education, explanation, and written materials on patient's individual exercise prescription    Expected Outcomes Short Term: Able to explain program exercise prescription;Long Term: Able to explain home exercise prescription to exercise independently Short Term: Able to explain program exercise prescription;Long Term: Able to explain home exercise prescription to exercise independently Short Term: Able to explain program exercise prescription;Long Term: Able to explain home exercise prescription to exercise independently Short Term: Able to explain program exercise prescription;Long Term: Able to explain home exercise prescription to exercise independently           Exercise Goals Re-Evaluation :  Exercise Goals Re-Evaluation    Row Name 12/09/20 1227 01/06/21 1213 02/03/21 1210         Exercise Goal Re-Evaluation   Exercise Goals Review Increase Strength and Stamina;Increase Physical Activity;Able to understand and use rate of perceived exertion (RPE) scale;Able to understand and use Dyspnea scale;Knowledge and understanding of Target Heart Rate Range (THRR);Able to check pulse independently;Understanding of Exercise Prescription Increase Strength and Stamina;Increase Physical Activity;Able to understand and use rate of perceived exertion (RPE) scale;Able to understand and use Dyspnea scale;Knowledge and understanding of Target  Heart Rate Range (THRR);Able to check pulse independently;Understanding of Exercise Prescription Increase Strength and Stamina;Increase Physical Activity;Able to understand and use rate of perceived exertion (RPE) scale;Able to understand and use Dyspnea scale;Knowledge and understanding of Target Heart Rate Range (THRR);Able to check pulse independently;Understanding of Exercise Prescription     Comments Patient has completed 5 exercise sessions. She has tolerated exercise well and improving well. She has progressed on the nuStep and we discussed  increasing her intensity on the resistance training next exercise session. She is getting stonger and building more stamina. She is currently exercising on the NuStep at 2.2 METs. Will continue to progress as able. Patient has completed 11 exercise sessions. She tolerates exercise well and is improving. She is building up stamina and feels that her daily life is getting easier. She is getting stronger. She is continuing to exercise at home. We discussed increasing her intensity at home. She is currently exercising at 2.7 METs on the Nustep. Will continue to progress as able. Patient has completed 16 exercise sessions. She is tolerating exercise well. Her progression has been stagnant and she hasnt progressed much since the last ITP. She does feel like rehab is helping her at home with ADLs. She is still exercising at home. She has expressed feeling more tired. She is currently exercising at 2.1 METs on the NuStep. WIll continue to monitor and progress as able.     Expected Outcomes Through exercise at rehab and with a home exercise program, patient will reach their goals. Through exercise at rehab and with a home exercise program, patient will reach their goals. Through exercise at rehab and with a home exercise program, patient will reach their goals.            Discharge Exercise Prescription (Final Exercise Prescription Changes):  Exercise Prescription Changes - 02/03/21 1200      Response to Exercise   Blood Pressure (Admit) 130/70    Blood Pressure (Exercise) 166/64    Blood Pressure (Exit) 118/80    Heart Rate (Admit) 111 bpm    Heart Rate (Exercise) 108 bpm    Heart Rate (Exit) 96 bpm    Oxygen Saturation (Admit) 98 %    Oxygen Saturation (Exercise) 98 %    Oxygen Saturation (Exit) 98 %    Rating of Perceived Exertion (Exercise) 12    Perceived Dyspnea (Exercise) 11    Duration Continue with 30 min of aerobic exercise without signs/symptoms of physical distress.    Intensity THRR  unchanged      Progression   Progression Continue to progress workloads to maintain intensity without signs/symptoms of physical distress.      Resistance Training   Training Prescription Yes    Weight 3 lbs    Reps 10-15    Time 10 Minutes      NuStep   Level 2    SPM 96    Minutes 39    METs 2.1           Nutrition:  Target Goals: Understanding of nutrition guidelines, daily intake of sodium 1500mg , cholesterol 200mg , calories 30% from fat and 7% or less from saturated fats, daily to have 5 or more servings of fruits and vegetables.  Biometrics:  Pre Biometrics - 02/03/21 1210      Pre Biometrics   Weight 72.2 kg            Nutrition Therapy Plan and Nutrition Goals:  Nutrition Therapy & Goals - 01/26/21 1453  Personal Nutrition Goals   Comments We provide 2 educational sessions on nutrition.Handout provided and discussed.      Intervention Plan   Intervention Nutrition handout(s) given to patient.           Nutrition Assessments:  Nutrition Assessments - 11/19/20 1315      MEDFICTS Scores   Pre Score 23          MEDIFICTS Score Key:  ?70 Need to make dietary changes   40-70 Heart Healthy Diet  ? 40 Therapeutic Level Cholesterol Diet   Picture Your Plate Scores:  <14 Unhealthy dietary pattern with much room for improvement.  41-50 Dietary pattern unlikely to meet recommendations for good health and room for improvement.  51-60 More healthful dietary pattern, with some room for improvement.   >60 Healthy dietary pattern, although there may be some specific behaviors that could be improved.    Nutrition Goals Re-Evaluation:   Nutrition Goals Discharge (Final Nutrition Goals Re-Evaluation):   Psychosocial: Target Goals: Acknowledge presence or absence of significant depression and/or stress, maximize coping skills, provide positive support system. Participant is able to verbalize types and ability to use techniques and skills  needed for reducing stress and depression.  Initial Review & Psychosocial Screening:  Initial Psych Review & Screening - 11/19/20 1313      Initial Review   Current issues with None Identified      Family Dynamics   Good Support System? Yes    Comments Her husband, her sons, and her sister all support her. She feels like she has a great support system. She has a son in [place] and another son in [place]. They speak daily and she sees them often.      Barriers   Psychosocial barriers to participate in program There are no identifiable barriers or psychosocial needs.      Screening Interventions   Interventions Encouraged to exercise    Expected Outcomes Long Term goal: The participant improves quality of Life and PHQ9 Scores as seen by post scores and/or verbalization of changes;Long Term Goal: Stressors or current issues are controlled or eliminated.           Quality of Life Scores:  Quality of Life - 11/19/20 1438      Quality of Life   Select Quality of Life      Quality of Life Scores   Health/Function Pre 18.5 %    Socioeconomic Pre 28.75 %    Family Pre 27.3 %    GLOBAL Pre 22.08 %          Scores of 19 and below usually indicate a poorer quality of life in these areas.  A difference of  2-3 points is a clinically meaningful difference.  A difference of 2-3 points in the total score of the Quality of Life Index has been associated with significant improvement in overall quality of life, self-image, physical symptoms, and general health in studies assessing change in quality of life.   PHQ-9: Recent Review Flowsheet Data    Depression screen [facility] 2/9 11/19/2020 02/23/2012   Decreased Interest 0 0   Down, Depressed, Hopeless 0 0   PHQ - 2 Score 0 0   Altered sleeping 0 -   Tired, decreased energy 3  -   Change in appetite 0 -   Feeling bad or failure about yourself  0 -   Trouble concentrating 0 -   Moving slowly or fidgety/restless 0 -   Suicidal thoughts 0 -  PHQ-9 Score 3 -   Difficult doing work/chores Not difficult at all -     Interpretation of Total Score  Total Score Depression Severity:  1-4 = Minimal depression, 5-9 = Mild depression, 10-14 = Moderate depression, 15-19 = Moderately severe depression, 20-27 = Severe depression   Psychosocial Evaluation and Intervention:  Psychosocial Evaluation - 11/19/20 1446      Psychosocial Evaluation & Interventions   Interventions Encouraged to exercise with the program and follow exercise prescription    Comments Patient has no identifiable psychosocial issues.    Expected Outcomes Patient will continue to not have any identifiable psychosocial issues.    Continue Psychosocial Services  No Follow up required           Psychosocial Re-Evaluation:  Psychosocial Re-Evaluation    [place] Name 12/05/20 2542 01/02/21 0906 01/26/21 1444 01/26/21 1450       Psychosocial Re-Evaluation   Current issues with History of Depression -- History of Depression --    Comments Patient is new to the program completeing 4 sessions. She does has a history of depression and is currently being treated with Citalopram 20 mg qd and Alprazolam 0.5 mg prn. Her inital QOL score was 22.08 and her PhQ9 was 3. She feels her depression is managed. She also takes Ambien for sleep. She seems to enjoy coming to the sessions demonstrating a positive attitude and is very interacitve with others in her class and the staff. We will continue to monitor. Patient has completed 10 sessions. She does has a history of depression which continues to be managed with Citalopram 20 mg qd and Alprazolam 0.5 mg prn. She feels her depression is managed. She also takes Ambien for sleep. She enjoys coming to the sessions and continues to demonstrat a positive attitude and is very interacitve with others in her class and the staff. We will continue to monitor. Patient has completed 14 sessions. She does have a history of depression which she feels her  depressiong continues to be managed . She enjoys coming to the sessions and continues to demonstrat a positive attitude and is very interacitve with others in her class and the staff. We will continue to monitor. --    Expected Outcomes Patient's depression will continue to be managed at discharge with no additional psychosocai issues identified. Patient's depression will continue to be managed at discharge with no additional psychosocai issues identified. -- Patient's depression will continue to be managed at discharge with no additional psychosocai issues identified.    Interventions Relaxation education;Encouraged to attend Pulmonary Rehabilitation for the exercise;Stress management education Relaxation education;Encouraged to attend Pulmonary Rehabilitation for the exercise;Stress management education -- Relaxation education;Encouraged to attend Pulmonary Rehabilitation for the exercise;Stress management education    Continue Psychosocial Services  No Follow up required No Follow up required -- No Follow up required           Psychosocial Discharge (Final Psychosocial Re-Evaluation):  Psychosocial Re-Evaluation - 01/26/21 1450      Psychosocial Re-Evaluation   Expected Outcomes Patient's depression will continue to be managed at discharge with no additional psychosocai issues identified.    Interventions Relaxation education;Encouraged to attend Pulmonary Rehabilitation for the exercise;Stress management education    Continue Psychosocial Services  No Follow up required            Education: Education Goals: Education classes will be provided on a weekly basis, covering required topics. Participant will state understanding/return demonstration of topics presented.  Learning Barriers/Preferences:  Learning Barriers/Preferences -  11/19/20 1315      Learning Barriers/Preferences   Learning Barriers None    Learning Preferences Audio;Written Material;Skilled Demonstration;Verbal  Instruction;Video;Computer/Internet;Group Instruction;Individual Instruction;Pictoral           Education Topics: How Lungs Work and Diseases: - Discuss the anatomy of the lungs and diseases that can affect the lungs, such as COPD.   Exercise: -Discuss the importance of exercise, FITT principles of exercise, normal and abnormal responses to exercise, and how to exercise safely.   Environmental Irritants: -Discuss types of environmental irritants and how to limit exposure to environmental irritants.   Meds/Inhalers and oxygen: - Discuss respiratory medications, definition of an inhaler and oxygen, and the proper way to use an inhaler and oxygen.   Energy Saving Techniques: - Discuss methods to conserve energy and decrease shortness of breath when performing activities of daily living.  Flowsheet Row PULMONARY REHAB OTHER RESPIRATORY from 01/15/2021 in [place]  Date 11/27/20  Educator mk  Instruction Review Code 2- Demonstrated Understanding      Bronchial Hygiene / Breathing Techniques: - Discuss breathing mechanics, pursed-lip breathing technique,  proper posture, effective ways to clear airways, and other functional breathing techniques Flowsheet Row PULMONARY REHAB OTHER RESPIRATORY from 01/15/2021 in [place]  Date 12/04/20  Educator handout  Instruction Review Code 1- Verbalizes Understanding      Cleaning Equipment: - Provides group verbal and written instruction about the health risks of elevated stress, cause of high stress, and healthy ways to reduce stress. Flowsheet Row PULMONARY REHAB OTHER RESPIRATORY from 01/15/2021 in [place]  Date 12/11/20  Educator Handout  Instruction Review Code 1- Verbalizes Understanding      Nutrition I: Fats: - Discuss the types of cholesterol, what cholesterol does to the body, and how cholesterol levels can be controlled. Flowsheet Row PULMONARY REHAB  OTHER RESPIRATORY from 01/15/2021 in [place]  Date 12/18/20  Educator Handout  Instruction Review Code 1- Verbalizes Understanding      Nutrition II: Labels: -Discuss the different components of food labels and how to read food labels.   Respiratory Infections: - Discuss the signs and symptoms of respiratory infections, ways to prevent respiratory infections, and the importance of seeking medical treatment when having a respiratory infection. Flowsheet Row PULMONARY REHAB OTHER RESPIRATORY from 01/15/2021 in [place]  Date 01/01/21  Educator Handout  Instruction Review Code 1- Verbalizes Understanding      Stress I: Signs and Symptoms: - Discuss the causes of stress, how stress may lead to anxiety and depression, and ways to limit stress. Flowsheet Row PULMONARY REHAB OTHER RESPIRATORY from 01/15/2021 in [place]  Date 01/08/21  Educator KM  Instruction Review Code 1- Verbalizes Understanding      Stress II: Relaxation: -Discuss relaxation techniques to limit stress. Flowsheet Row PULMONARY REHAB OTHER RESPIRATORY from 01/15/2021 in [place]  Date 01/15/21  Educator [facility]  Instruction Review Code 1- Verbalizes Understanding      Oxygen for Home/Travel: - Discuss how to prepare for travel when on oxygen and proper ways to transport and store oxygen to ensure safety.   Knowledge Questionnaire Score:  Knowledge Questionnaire Score - 11/19/20 1318      Knowledge Questionnaire Score   Pre Score 15/18           Core Components/Risk Factors/Patient Goals at Admission:  Personal Goals and Risk Factors at Admission - 11/19/20 1318      Core  Components/Risk Factors/Patient Goals on Admission    Weight Management Weight Loss;Yes    Intervention Weight Management: Develop a combined nutrition and exercise program designed to reach desired caloric intake, while maintaining  appropriate intake of nutrient and fiber, sodium and fats, and appropriate energy expenditure required for the weight goal.;Weight Management: Provide education and appropriate resources to help participant work on and attain dietary goals.;Weight Management/Obesity: Establish reasonable short term and long term weight goals.;Obesity: Provide education and appropriate resources to help participant work on and attain dietary goals.    Expected Outcomes Short Term: Continue to assess and modify interventions until short term weight is achieved;Long Term: Adherence to nutrition and physical activity/exercise program aimed toward attainment of established weight goal;Weight Maintenance: Understanding of the daily nutrition guidelines, which includes 25-35% calories from fat, 7% or less cal from saturated fats, less than 200mg  cholesterol, less than 1.5gm of sodium, & 5 or more servings of fruits and vegetables daily;Weight Loss: Understanding of general recommendations for a balanced deficit meal plan, which promotes 1-2 lb weight loss per week and includes a negative energy balance of [phone] kcal/d;Understanding recommendations for meals to include 15-35% energy as protein, 25-35% energy from fat, 35-60% energy from carbohydrates, less than 200mg  of dietary cholesterol, 20-35 gm of total fiber daily;Understanding of distribution of calorie intake throughout the day with the consumption of 4-5 meals/snacks;Weight Gain: Understanding of general recommendations for a high calorie, high protein meal plan that promotes weight gain by distributing calorie intake throughout the day with the consumption for 4-5 meals, snacks, and/or supplements    Improve shortness of breath with ADL's Yes    Intervention Provide education, individualized exercise plan and daily activity instruction to help decrease symptoms of SOB with activities of daily living.    Expected Outcomes Short Term: Improve cardiorespiratory fitness to  achieve a reduction of symptoms when performing ADLs;Long Term: Be able to perform more ADLs without symptoms or delay the onset of symptoms    Personal Goal Other Yes    Personal Goal To be able to come off of supplemental oxygen. She has been on it since she was hospitialized with COVID in July of 2021.    Intervention Exercise at rehab and begin a home exercise program.    Expected Outcomes She will be able to reduce her oxygen utilization following rehab.           Core Components/Risk Factors/Patient Goals Review:   Goals and Risk Factor Review    Row Name 12/05/20 2951 01/02/21 0907 01/26/21 1455         Core Components/Risk Factors/Patient Goals Review   Personal Goals Review Weight Management/Obesity;Improve shortness of breath with ADL's Weight Management/Obesity;Improve shortness of breath with ADL's Weight Management/Obesity;Improve shortness of breath with ADL's     Review Patient is new to the program completing 4 sessions losing 2 lbs since her initial visit. She was referred to pulmonary rehab due to SOB. She had COVID in July 21 abd ger SOB has increased since COVID. She was hospitalized for 9 days. Her personal goals for the program are to breathe better and return to her ADL's with less SOB. We will continue to monitor her progress as she works toward meeting these goals. Patient has completed 10 sessions losing 1 lb since her initial visit. She is doing well in the program with progression and consistent attendance. She continues to not require O2 during exercise with her O2 sat staying >95%. Her blood pressure is well controlled. Her  personal goals for the program are to breathe better and to be able to return to her ADL's with less SOB. We will continue to monitor as she works toward meeting her personal goals. Patient has completed 10 sessions losing 1 lb since her initial visit. She is doing well in the program with progression and consistent attendance. She continues to not  require O2 during exercise with her O2 sat staying >95%. Her blood pressure is well controlled. Her personal goals for the program are to breathe better and to be able to return to her ADL's with less SOB. She is doing well in the program. We will continue to monitor as she works toward meeting her personal goals.     Expected Outcomes Patient will complete the program meeting both personal and program goals. Patient will complete the program meeting both personal and program goals. Patient will complete the program meeting both personal and program goals.            Core Components/Risk Factors/Patient Goals at Discharge (Final Review):   Goals and Risk Factor Review - 01/26/21 1455      Core Components/Risk Factors/Patient Goals Review   Personal Goals Review Weight Management/Obesity;Improve shortness of breath with ADL's    Review Patient has completed 10 sessions losing 1 lb since her initial visit. She is doing well in the program with progression and consistent attendance. She continues to not require O2 during exercise with her O2 sat staying >95%. Her blood pressure is well controlled. Her personal goals for the program are to breathe better and to be able to return to her ADL's with less SOB. She is doing well in the program. We will continue to monitor as she works toward meeting her personal goals.    Expected Outcomes Patient will complete the program meeting both personal and program goals.           ITP Comments:   Comments: ITP REVIEW Pt is making expected progress toward pulmonary rehab goals after completing 17 sessions. Recommend continued exercise, life style modification, education, and utilization of breathing techniques to increase stamina and strength and decrease shortness of breath with exertion.

## 2021-02-05 ENCOUNTER — Encounter (HOSPITAL_COMMUNITY)
Admission: RE | Admit: 2021-02-05 | Discharge: 2021-02-05 | Disposition: A | Discharge status: Home / Self Care | Payer: BC Managed Care – PPO | Source: Ambulatory Visit | Attending: Internal Medicine | Admitting: Internal Medicine

## 2021-02-05 ENCOUNTER — Other Ambulatory Visit: Payer: Self-pay

## 2021-02-05 DIAGNOSIS — R0602 Shortness of breath: Secondary | ICD-10-CM

## 2021-02-05 NOTE — Progress Notes (Signed)
Daily Session Note  Patient Details  Name: Catherine Hancock MRN: [id number] Date of Birth: 1961-01-17 Referring Provider:   Flowsheet Row PULMONARY REHAB OTHER RESP ORIENTATION from 11/19/2020 in [place]  Referring Provider Dr. Chase Caller      Encounter Date: 02/05/2021  Check In:  Session Check In - 02/05/21 1045      Check-In   Supervising physician immediately available to respond to emergencies [facility] MD immediately available    Physician(s) Dr. Harl Bowie    Location [facility]    Staff Present [name], MS, Exercise Physiologist;Cheresa Siers Kris Mouton, MS, ACSM-CEP, Exercise Physiologist    Virtual Visit No    Medication changes reported     No    Fall or balance concerns reported    No    Tobacco Cessation No Change    Warm-up and Cool-down Performed as group-led instruction    Resistance Training Performed Yes    VAD Patient? No    PAD/SET Patient? No      Pain Assessment   Currently in Pain? No/denies    Multiple Pain Sites No           Capillary Blood Glucose: No results found for this or any previous visit (from the past 24 hour(s)).    Social History   Tobacco Use  Smoking Status Never Smoker  Smokeless Tobacco Never Used    Goals Met:  Independence with exercise equipment Exercise tolerated well No report of cardiac concerns or symptoms Strength training completed today  Goals Unmet:  Not Applicable  Comments: checkout time is 1145   [name] is Medical Director for [facility].

## 2021-02-10 ENCOUNTER — Other Ambulatory Visit: Payer: Self-pay

## 2021-02-10 ENCOUNTER — Encounter (HOSPITAL_COMMUNITY)
Admission: RE | Admit: 2021-02-10 | Discharge: 2021-02-10 | Disposition: A | Discharge status: Home / Self Care | Payer: BC Managed Care – PPO | Source: Ambulatory Visit | Attending: Internal Medicine | Admitting: Internal Medicine

## 2021-02-10 DIAGNOSIS — R0602 Shortness of breath: Secondary | ICD-10-CM | POA: Diagnosis not present

## 2021-02-10 NOTE — Progress Notes (Signed)
Daily Session Note  Patient Details  Name: Catherine Hancock MRN: [id number] Date of Birth: 10/09/1961 Referring Provider:   Flowsheet Row PULMONARY REHAB OTHER RESP ORIENTATION from 11/19/2020 in [place]  Referring Provider Dr. Chase Caller      Encounter Date: 02/10/2021  Check In:  Session Check In - 02/10/21 1045      Check-In   Supervising physician immediately available to respond to emergencies [facility] MD immediately available    Physician(s) Dr. Harl Bowie    Location [facility]    Staff Present [name], MS, Exercise Physiologist;Marykathryn Carboni Kris Mouton, MS, ACSM-CEP, Exercise Physiologist    Virtual Visit No    Medication changes reported     No    Fall or balance concerns reported    No    Tobacco Cessation No Change    Warm-up and Cool-down Performed as group-led instruction    Resistance Training Performed Yes    VAD Patient? No    PAD/SET Patient? No      Pain Assessment   Currently in Pain? No/denies    Multiple Pain Sites No           Capillary Blood Glucose: No results found for this or any previous visit (from the past 24 hour(s)).    Social History   Tobacco Use  Smoking Status Never Smoker  Smokeless Tobacco Never Used    Goals Met:  Independence with exercise equipment Exercise tolerated well No report of cardiac concerns or symptoms Strength training completed today  Goals Unmet:  Not Applicable  Comments: checkout time is 1145   [name] is Medical Director for [facility].

## 2021-02-12 ENCOUNTER — Encounter (HOSPITAL_COMMUNITY)
Admission: RE | Admit: 2021-02-12 | Discharge: 2021-02-12 | Disposition: A | Discharge status: Home / Self Care | Payer: BC Managed Care – PPO | Source: Ambulatory Visit | Attending: Internal Medicine | Admitting: Internal Medicine

## 2021-02-12 ENCOUNTER — Other Ambulatory Visit: Payer: Self-pay

## 2021-02-12 DIAGNOSIS — R0602 Shortness of breath: Secondary | ICD-10-CM

## 2021-02-12 NOTE — Progress Notes (Signed)
Daily Session Note  Patient Details  Name: ALAJA Hancock MRN: [id number] Date of Birth: Jul 05, 1961 Referring Provider:   Flowsheet Row PULMONARY REHAB OTHER RESP ORIENTATION from 11/19/2020 in [place]  Referring Provider Dr. Chase Caller      Encounter Date: 02/12/2021  Check In:  Session Check In - 02/12/21 1045      Check-In   Supervising physician immediately available to respond to emergencies [facility] MD immediately available    Physician(s) Dr. Harrington Challenger    Location [facility]    Staff Present [name], MS, Exercise Physiologist;Coulton Schlink Kris Mouton, MS, ACSM-CEP, Exercise Physiologist    Virtual Visit No    Medication changes reported     No    Fall or balance concerns reported    No    Tobacco Cessation No Change    Warm-up and Cool-down Performed as group-led instruction    Resistance Training Performed Yes    VAD Patient? No    PAD/SET Patient? No      Pain Assessment   Currently in Pain? No/denies    Multiple Pain Sites No           Capillary Blood Glucose: No results found for this or any previous visit (from the past 24 hour(s)).    Social History   Tobacco Use  Smoking Status Never Smoker  Smokeless Tobacco Never Used    Goals Met:  Independence with exercise equipment Exercise tolerated well No report of cardiac concerns or symptoms Strength training completed today  Goals Unmet:  Not Applicable  Comments: checkout time is 1145   [name] is Medical Director for [facility].

## 2021-02-12 NOTE — Telephone Encounter (Signed)
Unable to reach patient - closed encounter -pr

## 2021-02-17 ENCOUNTER — Encounter (HOSPITAL_COMMUNITY)
Admission: RE | Admit: 2021-02-17 | Discharge: 2021-02-17 | Disposition: A | Discharge status: Home / Self Care | Payer: BC Managed Care – PPO | Source: Ambulatory Visit | Attending: Internal Medicine | Admitting: Internal Medicine

## 2021-02-17 ENCOUNTER — Other Ambulatory Visit: Payer: Self-pay

## 2021-02-17 VITALS — Wt 161.8 lb

## 2021-02-17 DIAGNOSIS — R0602 Shortness of breath: Secondary | ICD-10-CM

## 2021-02-17 NOTE — Progress Notes (Signed)
Daily Session Note  Patient Details  Name: Catherine Hancock MRN: [id number] Date of Birth: [birth year] Referring Provider:   Flowsheet Row PULMONARY REHAB OTHER RESP ORIENTATION from 11/19/2020 in [place]  Referring Provider Dr. Chase Caller      Encounter Date: 02/17/2021  Check In:  Session Check In - 02/17/21 1045      Check-In   Supervising physician immediately available to respond to emergencies [facility] MD immediately available    Physician(s) Dr. Harl Bowie    Location [facility]    Staff Present [name], MS, Exercise Physiologist;Emmalin Jaquess Kris Mouton, MS, ACSM-CEP, Exercise Physiologist    Virtual Visit No    Medication changes reported     No    Fall or balance concerns reported    No    Tobacco Cessation No Change    Warm-up and Cool-down Performed as group-led instruction    Resistance Training Performed Yes    VAD Patient? No    PAD/SET Patient? No      Pain Assessment   Currently in Pain? No/denies    Multiple Pain Sites No           Capillary Blood Glucose: No results found for this or any previous visit (from the past 24 hour(s)).    Social History   Tobacco Use  Smoking Status Never Smoker  Smokeless Tobacco Never Used    Goals Met:  Independence with exercise equipment Exercise tolerated well No report of cardiac concerns or symptoms Strength training completed today  Goals Unmet:  Not Applicable  Comments: checkout time is 1145   [name] is Medical Director for [facility].

## 2021-02-19 ENCOUNTER — Encounter (HOSPITAL_COMMUNITY)
Admission: RE | Admit: 2021-02-19 | Discharge: 2021-02-19 | Disposition: A | Discharge status: Home / Self Care | Payer: BC Managed Care – PPO | Source: Ambulatory Visit | Attending: Internal Medicine | Admitting: Internal Medicine

## 2021-02-19 ENCOUNTER — Other Ambulatory Visit: Payer: Self-pay

## 2021-02-19 DIAGNOSIS — R0602 Shortness of breath: Secondary | ICD-10-CM

## 2021-02-19 NOTE — Progress Notes (Signed)
Daily Session Note  Patient Details  Name: Catherine Hancock MRN: [id number] Date of Birth: [birth year] Referring Provider:   Flowsheet Row PULMONARY REHAB OTHER RESP ORIENTATION from 11/19/2020 in [place]  Referring Provider Dr. Chase Caller      Encounter Date: 02/19/2021  Check In:  Session Check In - 02/19/21 1045      Check-In   Supervising physician immediately available to respond to emergencies [facility] immediately available    Physician(s) Dr. Harl Bowie    Location [facility]    Staff Present Aundra Dubin, RN, BSN;[place] Audria Nine, MS, Exercise Physiologist    Virtual Visit No    Fall or balance concerns reported    No    Tobacco Cessation No Change    Warm-up and Cool-down Performed as group-led instruction    Resistance Training Performed Yes    VAD Patient? No    PAD/SET Patient? No      Pain Assessment   Currently in Pain? No/denies    Multiple Pain Sites No           Capillary Blood Glucose: No results found for this or any previous visit (from the past 24 hour(s)).    Social History   Tobacco Use  Smoking Status Never Smoker  Smokeless Tobacco Never Used    Goals Met:  Proper associated with RPD/PD & O2 Sat Independence with exercise equipment Improved SOB with ADL's Using PLB without cueing & demonstrates good technique Exercise tolerated well No report of cardiac concerns or symptoms Strength training completed today  Goals Unmet:  Not Applicable  Comments: Check out 1145.   [name] is Medical Director for [facility].

## 2021-02-24 ENCOUNTER — Encounter (HOSPITAL_COMMUNITY): Payer: BC Managed Care – PPO

## 2021-02-26 ENCOUNTER — Encounter (HOSPITAL_COMMUNITY)
Admission: RE | Admit: 2021-02-26 | Discharge: 2021-02-26 | Disposition: A | Discharge status: Home / Self Care | Payer: BC Managed Care – PPO | Source: Ambulatory Visit | Attending: Internal Medicine | Admitting: Internal Medicine

## 2021-02-26 ENCOUNTER — Other Ambulatory Visit: Payer: Self-pay

## 2021-02-26 VITALS — Wt 161.2 lb

## 2021-02-26 DIAGNOSIS — R0602 Shortness of breath: Secondary | ICD-10-CM

## 2021-02-26 NOTE — Progress Notes (Signed)
Daily Session Note  Patient Details  Name: KALYIAH SAINTIL MRN: [id number] Date of Birth: 1961/08/28 Referring Provider:   Flowsheet Row PULMONARY REHAB OTHER RESP ORIENTATION from 11/19/2020 in [place]  Referring Provider Dr. Chase Caller      Encounter Date: 02/26/2021  Check In:  Session Check In - 02/26/21 1045      Check-In   Supervising physician immediately available to respond to emergencies [facility] MD immediately available    Physician(s) Dr. Domenic Polite    Location [facility]    Staff Present [name], MS, Exercise Physiologist;Cleo Santucci Kris Mouton, MS, ACSM-CEP, Exercise Physiologist    Virtual Visit No    Medication changes reported     No    Fall or balance concerns reported    No    Tobacco Cessation No Change    Warm-up and Cool-down Performed as group-led instruction    Resistance Training Performed Yes    VAD Patient? No    PAD/SET Patient? No      Pain Assessment   Currently in Pain? No/denies    Multiple Pain Sites No           Capillary Blood Glucose: No results found for this or any previous visit (from the past 24 hour(s)).    Social History   Tobacco Use  Smoking Status Never Smoker  Smokeless Tobacco Never Used    Goals Met:  Independence with exercise equipment Exercise tolerated well No report of cardiac concerns or symptoms Strength training completed today  Goals Unmet:  Not Applicable  Comments: checkout time is 1145   [name] is Medical Director for [facility].

## 2021-03-03 ENCOUNTER — Encounter (HOSPITAL_COMMUNITY): Payer: BC Managed Care – PPO

## 2021-03-05 ENCOUNTER — Encounter (HOSPITAL_COMMUNITY): Payer: BC Managed Care – PPO

## 2021-03-05 NOTE — Progress Notes (Signed)
Pulmonary Individual Treatment Plan  Patient Details  Name: Catherine Hancock MRN: [id number] Date of Birth: [birth year] Referring Provider:   Flowsheet Row PULMONARY REHAB OTHER RESP ORIENTATION from 11/19/2020 in [facility]  Referring Provider Dr. Chase Caller      Initial Encounter Date:  Flowsheet Row PULMONARY REHAB OTHER RESP ORIENTATION from 11/19/2020 in [place]  Date 11/19/20      Visit Diagnosis: Shortness of breath  Patient's Home Medications on Admission:   Current Outpatient Medications:  .  albuterol (ACCUNEB) 0.63 MG/3ML nebulizer solution, Take by nebulization 3 (three) times daily., Disp: , Rfl:  .  albuterol (VENTOLIN HFA) 108 (90 Base) MCG/ACT inhaler, Inhale 2 puffs into the lungs every 6 (six) hours., Disp: 8 g, Rfl: 0 .  ALPRAZolam (XANAX) 0.5 MG tablet, Take 0.25 mg by mouth as directed. PRN, Disp: , Rfl:  .  aspirin EC 81 MG tablet, Take 1 tablet (81 mg total) by mouth daily., Disp: , Rfl:  .  azelastine (ASTELIN) 0.1 % nasal spray, Place 2 sprays into the nose 4 (four) times daily as needed., Disp: , Rfl:  .  BIOTIN PO, Take 1 tablet by mouth as directed., Disp: , Rfl:  .  budesonide-formoterol (SYMBICORT) 160-4.5 MCG/ACT inhaler, Inhale 2 puffs into the lungs 2 (two) times daily., Disp: 1 each, Rfl: 6 .  budesonide-formoterol (SYMBICORT) 160-4.5 MCG/ACT inhaler, Inhale 2 puffs into the lungs 2 (two) times daily., Disp: , Rfl:  .  Calcium Carb-Cholecalciferol (CALCIUM 600+D3) 600-800 MG-UNIT TABS, Take 1 tablet by mouth daily., Disp: , Rfl:  .  Cholecalciferol (VITAMIN D) 50 MCG (2000 UT) CAPS, Take 1 capsule by mouth daily with breakfast. Takes in addition to weekly ergocalciferol, Disp: , Rfl:  .  Cholecalciferol (VITAMIN D3 PO), Take 25,000 Units by mouth once a week., Disp: , Rfl:  .  citalopram (CELEXA) 20 MG tablet, Take 20 mg by mouth daily. , Disp: , Rfl:  .  cyclobenzaprine (FLEXERIL) 10 MG tablet, TAKE 1  TABLET THREE TIMES A DAY AS NEEDED FOR MUSCLE SPASMS (Patient taking differently: Take 10 mg by mouth at bedtime.), Disp: 90 tablet, Rfl: 0 .  diclofenac (VOLTAREN) 50 MG EC tablet, Take 1 tablet by mouth 2 (two) times daily., Disp: , Rfl:  .  DONNATAL 16.2 MG tablet, Take 16.2 mg by mouth daily as needed (Stomach pain). , Disp: , Rfl:  .  EPINEPHrine 0.3 mg/0.3 mL IJ SOAJ injection, Inject 0.3 mg into the muscle as needed for anaphylaxis. , Disp: , Rfl:  .  esomeprazole (NEXIUM) 40 MG capsule, Take 40 mg by mouth 2 (two) times daily before a meal. , Disp: , Rfl:  .  HYDROcodone-acetaminophen (NORCO/VICODIN) 5-325 MG tablet, Take 1 tablet by mouth every 6 (six) hours as needed for moderate pain., Disp: , Rfl:  .  ibuprofen (ADVIL,MOTRIN) 800 MG tablet, Take 800 mg by mouth every 8 (eight) hours as needed (pain)., Disp: , Rfl:  .  levocetirizine (XYZAL) 5 MG tablet, Take 1 tablet by mouth daily., Disp: , Rfl:  .  levothyroxine (SYNTHROID) 50 MCG tablet, Take 50 mcg by mouth daily. , Disp: , Rfl:  .  lubiprostone (AMITIZA) 24 MCG capsule, Take 1 capsule by mouth 2 (two) times daily., Disp: , Rfl:  .  meclizine (ANTIVERT) 25 MG tablet, Take 1 tablet by mouth 3 (three) times daily as needed., Disp: , Rfl:  .  montelukast (SINGULAIR) 10 MG tablet, Take 10 mg by mouth at  bedtime., Disp: , Rfl:  .  nitroGLYCERIN (NITROSTAT) 0.4 MG SL tablet, Place 1 tablet (0.4 mg total) under the tongue every 5 (five) minutes as needed for chest pain., Disp: 25 tablet, Rfl: 12 .  polyethylene glycol powder (GLYCOLAX/MIRALAX) powder, Take 17 g by mouth as needed for mild constipation. , Disp: , Rfl:  .  predniSONE (DELTASONE) 10 MG tablet, 4 x 2 days, 3 x 2 days, 2 x 2 days, 1 x 2 days, then stop, Disp: 20 tablet, Rfl: 0 .  Prenatal Vit-Fe Fumarate-FA (M-NATAL PLUS) 27-1 MG TABS, Take 1 tablet by mouth at bedtime., Disp: , Rfl:  .  topiramate (TOPAMAX) 50 MG tablet, Take 50-100 mg by mouth See admin instructions. 50mg  in  the morning and 100mg  by mouth at night, Disp: , Rfl:  .  TRULANCE 3 MG TABS, Take 1 tablet by mouth daily., Disp: , Rfl:  .  Vitamin D, Ergocalciferol, (DRISDOL) 1.25 MG (50000 UNIT) CAPS capsule, Take 50,000 Units by mouth once a week., Disp: , Rfl:  .  zinc gluconate 50 MG tablet, Take 50 mg by mouth daily., Disp: , Rfl:  .  zolpidem (AMBIEN) 10 MG tablet, Take 0.5 tablets by mouth at bedtime as needed for sleep. , Disp: , Rfl:   Past Medical History: Past Medical History:  Diagnosis Date  . Asthma   . Asthma   . Chest pain    LHC 7/08: EF 60%, normal coronaries; ETT-Echo 6/11: EF 55-60%, no ischemia  . Chronic back pain   . CTS (carpal tunnel syndrome)    bil  . CTS (carpal tunnel syndrome)   . Depression 05/28/2020  . Hypothyroidism   . IBS (irritable bowel syndrome)   . Internal hemorrhoids   . Migraines   . OSA on CPAP 11/13/2017   Mild with AHI 13/hr   . Pulmonary nodule, right     Tobacco Use: Social History   Tobacco Use  Smoking Status Never Smoker  Smokeless Tobacco Never Used    Labs: Recent Review Scientist, physiological    Labs for ITP Cardiac and Pulmonary Rehab Latest Ref Rng & Units 05/28/2020   Trlycerides <150 mg/dL 123      Capillary Blood Glucose: No results found for: GLUCAP   Pulmonary Assessment Scores:  Pulmonary Assessment Scores    Row Name 11/19/20 1321         ADL UCSD   ADL Phase Entry     SOB Score total 59           CAT Score   CAT Score 22           mMRC Score   mMRC Score 2           UCSD: Self-administered rating of dyspnea associated with activities of daily living (ADLs) 6-point scale (0 = "not at all" to 5 = "maximal or unable to do because of breathlessness")  Scoring Scores range from 0 to 120.  Minimally important difference is 5 units  CAT: CAT can identify the health impairment of COPD patients and is better correlated with disease progression.  CAT has a scoring range of zero to 40. The CAT score is classified  into four groups of low (less than 10), medium (10 - 20), high (21-30) and very high (31-40) based on the impact level of disease on health status. A CAT score over 10 suggests significant symptoms.  A worsening CAT score could be explained by an exacerbation, poor medication adherence, poor inhaler  technique, or progression of COPD or comorbid conditions.  CAT MCID is 2 points  mMRC: mMRC (Modified Medical Research Council) Dyspnea Scale is used to assess the degree of baseline functional disability in patients of respiratory disease due to dyspnea. No minimal important difference is established. A decrease in score of 1 point or greater is considered a positive change.   Pulmonary Function Assessment:  Pulmonary Function Assessment - 11/19/20 1310      Breath   Shortness of Breath Yes;Limiting activity           Exercise Target Goals: Exercise Program Goal: Individual exercise prescription set using results from initial 6 min walk test and THRR while considering  patient's activity barriers and safety.   Exercise Prescription Goal: Initial exercise prescription builds to 30-45 minutes a day of aerobic activity, 2-3 days per week.  Home exercise guidelines will be given to patient during program as part of exercise prescription that the participant will acknowledge.  Activity Barriers & Risk Stratification:  Activity Barriers & Cardiac Risk Stratification - 11/19/20 1311      Activity Barriers & Cardiac Risk Stratification   Activity Barriers Deconditioning;Shortness of Breath;Chest Pain/Angina    Cardiac Risk Stratification Low           6 Minute Walk:  6 Minute Walk    Row Name 11/19/20 1433         6 Minute Walk   Phase Initial     Distance 1400 feet     Walk Time 6 minutes     # of Rest Breaks 0     MPH 3.2     METS 3.92     RPE 11     Perceived Dyspnea  11     VO2 Peak 13.72     Symptoms No     Resting HR 75 bpm     Resting BP 112/72     Resting Oxygen  Saturation  96 %     Exercise Oxygen Saturation  during 6 min walk 96 %     Max Ex. HR 102 bpm     Max Ex. BP 130/78     2 Minute Post BP 120/80           Interval HR   1 Minute HR 94     2 Minute HR 98     3 Minute HR 101     4 Minute HR 101     5 Minute HR 102     6 Minute HR 101     2 Minute Post HR 86     Interval Heart Rate? Yes           Interval Oxygen   Interval Oxygen? Yes     Baseline Oxygen Saturation % 96 %     1 Minute Oxygen Saturation % 96 %     1 Minute Liters of Oxygen 0 L     2 Minute Oxygen Saturation % 97 %     2 Minute Liters of Oxygen 0 L     3 Minute Oxygen Saturation % 97 %     3 Minute Liters of Oxygen 0 L     4 Minute Oxygen Saturation % 97 %     4 Minute Liters of Oxygen 0 L     5 Minute Oxygen Saturation % 96 %     5 Minute Liters of Oxygen 0 L     6 Minute Oxygen Saturation % 96 %  6 Minute Liters of Oxygen 0 L     2 Minute Post Oxygen Saturation % 98 %     2 Minute Post Liters of Oxygen 0 L            Oxygen Initial Assessment:  Oxygen Initial Assessment - 11/19/20 1444      Initial 6 min Walk   Oxygen Used None      Program Oxygen Prescription   Program Oxygen Prescription None   she has been using 2 L PRN since she got out of the hospital in July of 2020 but did not require it during her walk test. We will start her on RA and monitor her levels.     Intervention   Short Term Goals To learn and exhibit compliance with exercise, home and travel O2 prescription;To learn and understand importance of monitoring SPO2 with pulse oximeter and demonstrate accurate use of the pulse oximeter.;To learn and understand importance of maintaining oxygen saturations>88%;To learn and demonstrate proper pursed lip breathing techniques or other breathing techniques.;To learn and demonstrate proper use of respiratory medications    Long  Term Goals Exhibits compliance with exercise, home and travel O2 prescription;Maintenance of O2  saturations>88%;Verbalizes importance of monitoring SPO2 with pulse oximeter and return demonstration;Compliance with respiratory medication;Demonstrates proper use of MDI's;Exhibits proper breathing techniques, such as pursed lip breathing or other method taught during program session           Oxygen Re-Evaluation:  Oxygen Re-Evaluation    Row Name 12/09/20 1230 01/06/21 1216 02/03/21 1210 02/26/21 1132       Program Oxygen Prescription   Program Oxygen Prescription None None None None         Home Oxygen   Home Oxygen Device Home Concentrator;E-Tanks Home Concentrator;E-Tanks -- Home Concentrator;E-Tanks    Sleep Oxygen Prescription CPAP CPAP -- CPAP    Liters per minute 6 6 -- 6    Home Exercise Oxygen Prescription Continuous Continuous None None    Liters per minute 2 2 -- 2    Home Resting Oxygen Prescription None None -- None    Compliance with Home Oxygen Use Yes Yes -- Yes         Goals/Expected Outcomes   Short Term Goals To learn and exhibit compliance with exercise, home and travel O2 prescription;To learn and understand importance of monitoring SPO2 with pulse oximeter and demonstrate accurate use of the pulse oximeter.;To learn and understand importance of maintaining oxygen saturations>88%;To learn and demonstrate proper pursed lip breathing techniques or other breathing techniques.;To learn and demonstrate proper use of respiratory medications To learn and exhibit compliance with exercise, home and travel O2 prescription;To learn and understand importance of monitoring SPO2 with pulse oximeter and demonstrate accurate use of the pulse oximeter.;To learn and understand importance of maintaining oxygen saturations>88%;To learn and demonstrate proper pursed lip breathing techniques or other breathing techniques.;To learn and demonstrate proper use of respiratory medications To learn and exhibit compliance with exercise, home and travel O2 prescription;To learn and understand  importance of monitoring SPO2 with pulse oximeter and demonstrate accurate use of the pulse oximeter.;To learn and understand importance of maintaining oxygen saturations>88%;To learn and demonstrate proper pursed lip breathing techniques or other breathing techniques.;To learn and demonstrate proper use of respiratory medications To learn and exhibit compliance with exercise, home and travel O2 prescription;To learn and understand importance of monitoring SPO2 with pulse oximeter and demonstrate accurate use of the pulse oximeter.;To learn and understand importance of maintaining oxygen saturations>88%;To learn  and demonstrate proper pursed lip breathing techniques or other breathing techniques.;To learn and demonstrate proper use of respiratory medications    Long  Term Goals Exhibits compliance with exercise, home and travel O2 prescription;Maintenance of O2 saturations>88%;Verbalizes importance of monitoring SPO2 with pulse oximeter and return demonstration;Compliance with respiratory medication;Demonstrates proper use of MDI's;Exhibits proper breathing techniques, such as pursed lip breathing or other method taught during program session Exhibits compliance with exercise, home and travel O2 prescription;Maintenance of O2 saturations>88%;Verbalizes importance of monitoring SPO2 with pulse oximeter and return demonstration;Compliance with respiratory medication;Demonstrates proper use of MDI's;Exhibits proper breathing techniques, such as pursed lip breathing or other method taught during program session Exhibits compliance with exercise, home and travel O2 prescription;Maintenance of O2 saturations>88%;Verbalizes importance of monitoring SPO2 with pulse oximeter and return demonstration;Compliance with respiratory medication;Demonstrates proper use of MDI's;Exhibits proper breathing techniques, such as pursed lip breathing or other method taught during program session Exhibits compliance with exercise,  home and travel O2 prescription;Maintenance of O2 saturations>88%;Verbalizes importance of monitoring SPO2 with pulse oximeter and return demonstration;Compliance with respiratory medication;Demonstrates proper use of MDI's;Exhibits proper breathing techniques, such as pursed lip breathing or other method taught during program session           Oxygen Discharge (Final Oxygen Re-Evaluation):  Oxygen Re-Evaluation - 02/26/21 1132      Program Oxygen Prescription   Program Oxygen Prescription None      Home Oxygen   Home Oxygen Device Home Concentrator;E-Tanks    Sleep Oxygen Prescription CPAP    Liters per minute 6    Home Exercise Oxygen Prescription None    Liters per minute 2    Home Resting Oxygen Prescription None    Compliance with Home Oxygen Use Yes      Goals/Expected Outcomes   Short Term Goals To learn and exhibit compliance with exercise, home and travel O2 prescription;To learn and understand importance of monitoring SPO2 with pulse oximeter and demonstrate accurate use of the pulse oximeter.;To learn and understand importance of maintaining oxygen saturations>88%;To learn and demonstrate proper pursed lip breathing techniques or other breathing techniques.;To learn and demonstrate proper use of respiratory medications    Long  Term Goals Exhibits compliance with exercise, home and travel O2 prescription;Maintenance of O2 saturations>88%;Verbalizes importance of monitoring SPO2 with pulse oximeter and return demonstration;Compliance with respiratory medication;Demonstrates proper use of MDI's;Exhibits proper breathing techniques, such as pursed lip breathing or other method taught during program session           Initial Exercise Prescription:  Initial Exercise Prescription - 11/19/20 1400      Date of Initial Exercise RX and Referring Provider   Date 11/19/20    Referring Provider Dr. Marchelle Gearing    Expected Discharge Date 03/19/21      NuStep   Level 1    SPM 80     Minutes 22      Arm Ergometer   Level 1    RPM 60    Minutes 22      Intensity   THRR 40-80% of Max Heartrate 64-129    Ratings of Perceived Exertion 11-13    Perceived Dyspnea 0-4      Progression   Progression Continue to progress workloads to maintain intensity without signs/symptoms of physical distress.      Resistance Training   Training Prescription Yes    Weight 2    Reps 10-15           Perform Capillary Blood Glucose checks as needed.  Exercise Prescription Changes:   Exercise Prescription Changes    Row Name 11/25/20 1100 12/02/20 1600 12/09/20 1200 12/23/20 1200 01/06/21 1200     Response to Exercise   Blood Pressure (Admit) 128/70 -- 134/80 120/72 116/56   Blood Pressure (Exercise) 128/84 -- 125/70 118/64 128/60   Blood Pressure (Exit) 114/80 -- 118/64 114/78 110/72   Heart Rate (Admit) 103 bpm -- 92 bpm 67 bpm 104 bpm   Heart Rate (Exercise) 102 bpm -- 110 bpm 87 bpm 114 bpm   Heart Rate (Exit) 97 bpm -- 100 bpm 77 bpm 95 bpm   Oxygen Saturation (Admit) 100 % -- 99 % 98 % 99 %   Oxygen Saturation (Exercise) 97 % -- 95 % 97 % 94 %   Oxygen Saturation (Exit) 99 % -- 98 % 98 % 98 %   Rating of Perceived Exertion (Exercise) 11 -- 12 12 15    Perceived Dyspnea (Exercise) 12 -- 12 11 11    Duration Continue with 30 min of aerobic exercise without signs/symptoms of physical distress. -- Continue with 30 min of aerobic exercise without signs/symptoms of physical distress. Continue with 30 min of aerobic exercise without signs/symptoms of physical distress. Continue with 30 min of aerobic exercise without signs/symptoms of physical distress.   Intensity THRR unchanged -- THRR unchanged THRR unchanged THRR unchanged     Progression   Progression Continue to progress workloads to maintain intensity without signs/symptoms of physical distress. -- Continue to progress workloads to maintain intensity without signs/symptoms of physical distress. Continue to progress  workloads to maintain intensity without signs/symptoms of physical distress. Continue to progress workloads to maintain intensity without signs/symptoms of physical distress.     Resistance Training   Training Prescription Yes -- Yes Yes Yes   Weight 2 -- 2 lbs 3 lbs 3 lbs   Reps 10-15 -- 10-15 10-15 10-15   Time 10 Minutes -- 10 Minutes 10 Minutes 10 Minutes     NuStep   Level 1 -- 1 2 2    SPM 75 -- 89 112 108   Minutes 39 -- 39 39 39   METs 1.6 -- 2.2 2.9 2.7     Home Exercise Plan   Plans to continue exercise at -- Home (comment) -- -- --   Frequency -- Add 2 additional days to program exercise sessions. -- -- --   Initial Home Exercises Provided -- 12/02/20 -- -- --   [place] Name 01/20/21 1500 02/03/21 1200 02/17/21 1100 02/26/21 1200       Response to Exercise   Blood Pressure (Admit) 140/70 130/70 118/74 124/66    Blood Pressure (Exercise) 128/74 166/64 154/62 130/62    Blood Pressure (Exit) 120/66 118/80 116/74 106/60    Heart Rate (Admit) 110 bpm 111 bpm 100 bpm 107 bpm    Heart Rate (Exercise) 103 bpm 108 bpm 122 bpm 104 bpm    Heart Rate (Exit) 95 bpm 96 bpm 110 bpm 96 bpm    Oxygen Saturation (Admit) 96 % 98 % 97 % 97 %    Oxygen Saturation (Exercise) 97 % 98 % 96 % 99 %    Oxygen Saturation (Exit) 97 % 98 % 96 % 96 %    Rating of Perceived Exertion (Exercise) 13 12 17 13     Perceived Dyspnea (Exercise) 13 11 13 13     Duration Continue with 30 min of aerobic exercise without signs/symptoms of physical distress. Continue with 30 min of aerobic exercise without signs/symptoms of physical  distress. Continue with 30 min of aerobic exercise without signs/symptoms of physical distress. Continue with 30 min of aerobic exercise without signs/symptoms of physical distress.    Intensity THRR unchanged THRR unchanged THRR unchanged THRR unchanged         Progression   Progression Continue to progress workloads to maintain intensity without signs/symptoms of physical distress.  Continue to progress workloads to maintain intensity without signs/symptoms of physical distress. Continue to progress workloads to maintain intensity without signs/symptoms of physical distress. Continue to progress workloads to maintain intensity without signs/symptoms of physical distress.         Resistance Training   Training Prescription Yes Yes Yes Yes    Weight 3 lbs 3 lbs 5 lbs 5 lbs    Reps 10-15 10-15 10-15 10-15    Time 10 Minutes 10 Minutes 10 Minutes 10 Minutes         NuStep   Level 2 2 2 2     SPM 112 96 128 98    Minutes 39 39 39 39    METs 3 2.1 4.2 2.6           Exercise Comments:   Exercise Comments    Row Name 11/25/20 1154 12/02/20 1603         Exercise Comments Patient completed first exercise session today. She said she doesnt really exercise so it was different for her, but she enjoyed it and is excited to come back. She tolerated exercise well today. Reviewed home exercise program with patient. She is not currently exercicing at home and plans to start walking.             Exercise Goals and Review:   Exercise Goals    Row Name 11/19/20 1437 12/09/20 1227 01/06/21 1213 02/03/21 1212 02/26/21 1200     Exercise Goals   Increase Physical Activity Yes Yes Yes Yes Yes   Intervention Develop an individualized exercise prescription for aerobic and resistive training based on initial evaluation findings, risk stratification, comorbidities and participant's personal goals.;Provide advice, education, support and counseling about physical activity/exercise needs. Develop an individualized exercise prescription for aerobic and resistive training based on initial evaluation findings, risk stratification, comorbidities and participant's personal goals.;Provide advice, education, support and counseling about physical activity/exercise needs. Develop an individualized exercise prescription for aerobic and resistive training based on initial evaluation findings, risk  stratification, comorbidities and participant's personal goals.;Provide advice, education, support and counseling about physical activity/exercise needs. Develop an individualized exercise prescription for aerobic and resistive training based on initial evaluation findings, risk stratification, comorbidities and participant's personal goals.;Provide advice, education, support and counseling about physical activity/exercise needs. Develop an individualized exercise prescription for aerobic and resistive training based on initial evaluation findings, risk stratification, comorbidities and participant's personal goals.;Provide advice, education, support and counseling about physical activity/exercise needs.   Expected Outcomes Short Term: Attend rehab on a regular basis to increase amount of physical activity.;Long Term: Add in home exercise to make exercise part of routine and to increase amount of physical activity.;Long Term: Exercising regularly at least 3-5 days a week. Short Term: Attend rehab on a regular basis to increase amount of physical activity.;Long Term: Add in home exercise to make exercise part of routine and to increase amount of physical activity.;Long Term: Exercising regularly at least 3-5 days a week. Short Term: Attend rehab on a regular basis to increase amount of physical activity.;Long Term: Add in home exercise to make exercise part of routine and to increase amount of  physical activity.;Long Term: Exercising regularly at least 3-5 days a week. Short Term: Attend rehab on a regular basis to increase amount of physical activity.;Long Term: Add in home exercise to make exercise part of routine and to increase amount of physical activity.;Long Term: Exercising regularly at least 3-5 days a week. Short Term: Attend rehab on a regular basis to increase amount of physical activity.;Long Term: Add in home exercise to make exercise part of routine and to increase amount of physical activity.;Long  Term: Exercising regularly at least 3-5 days a week.   Increase Strength and Stamina Yes Yes Yes Yes Yes   Intervention Provide advice, education, support and counseling about physical activity/exercise needs.;Develop an individualized exercise prescription for aerobic and resistive training based on initial evaluation findings, risk stratification, comorbidities and participant's personal goals. Provide advice, education, support and counseling about physical activity/exercise needs.;Develop an individualized exercise prescription for aerobic and resistive training based on initial evaluation findings, risk stratification, comorbidities and participant's personal goals. Provide advice, education, support and counseling about physical activity/exercise needs.;Develop an individualized exercise prescription for aerobic and resistive training based on initial evaluation findings, risk stratification, comorbidities and participant's personal goals. Provide advice, education, support and counseling about physical activity/exercise needs.;Develop an individualized exercise prescription for aerobic and resistive training based on initial evaluation findings, risk stratification, comorbidities and participant's personal goals. Provide advice, education, support and counseling about physical activity/exercise needs.;Develop an individualized exercise prescription for aerobic and resistive training based on initial evaluation findings, risk stratification, comorbidities and participant's personal goals.   Expected Outcomes Short Term: Increase workloads from initial exercise prescription for resistance, speed, and METs.;Short Term: Perform resistance training exercises routinely during rehab and add in resistance training at home;Long Term: Improve cardiorespiratory fitness, muscular endurance and strength as measured by increased METs and functional capacity (6MWT) Short Term: Increase workloads from initial exercise  prescription for resistance, speed, and METs.;Short Term: Perform resistance training exercises routinely during rehab and add in resistance training at home;Long Term: Improve cardiorespiratory fitness, muscular endurance and strength as measured by increased METs and functional capacity (6MWT) Short Term: Increase workloads from initial exercise prescription for resistance, speed, and METs.;Short Term: Perform resistance training exercises routinely during rehab and add in resistance training at home;Long Term: Improve cardiorespiratory fitness, muscular endurance and strength as measured by increased METs and functional capacity (6MWT) Short Term: Increase workloads from initial exercise prescription for resistance, speed, and METs.;Short Term: Perform resistance training exercises routinely during rehab and add in resistance training at home;Long Term: Improve cardiorespiratory fitness, muscular endurance and strength as measured by increased METs and functional capacity (6MWT) Short Term: Increase workloads from initial exercise prescription for resistance, speed, and METs.;Short Term: Perform resistance training exercises routinely during rehab and add in resistance training at home;Long Term: Improve cardiorespiratory fitness, muscular endurance and strength as measured by increased METs and functional capacity (6MWT)   Able to understand and use rate of perceived exertion (RPE) scale Yes Yes Yes Yes Yes   Intervention Provide education and explanation on how to use RPE scale Provide education and explanation on how to use RPE scale Provide education and explanation on how to use RPE scale Provide education and explanation on how to use RPE scale Provide education and explanation on how to use RPE scale   Expected Outcomes Short Term: Able to use RPE daily in rehab to express subjective intensity level;Long Term:  Able to use RPE to guide intensity level when exercising independently Short Term: Able to  use RPE daily in rehab to express subjective intensity level;Long Term:  Able to use RPE to guide intensity level when exercising independently Short Term: Able to use RPE daily in rehab to express subjective intensity level;Long Term:  Able to use RPE to guide intensity level when exercising independently Short Term: Able to use RPE daily in rehab to express subjective intensity level;Long Term:  Able to use RPE to guide intensity level when exercising independently Short Term: Able to use RPE daily in rehab to express subjective intensity level;Long Term:  Able to use RPE to guide intensity level when exercising independently   Able to understand and use Dyspnea scale Yes Yes Yes Yes Yes   Intervention Provide education and explanation on how to use Dyspnea scale Provide education and explanation on how to use Dyspnea scale Provide education and explanation on how to use Dyspnea scale Provide education and explanation on how to use Dyspnea scale Provide education and explanation on how to use Dyspnea scale   Expected Outcomes Short Term: Able to use Dyspnea scale daily in rehab to express subjective sense of shortness of breath during exertion;Long Term: Able to use Dyspnea scale to guide intensity level when exercising independently Short Term: Able to use Dyspnea scale daily in rehab to express subjective sense of shortness of breath during exertion;Long Term: Able to use Dyspnea scale to guide intensity level when exercising independently Short Term: Able to use Dyspnea scale daily in rehab to express subjective sense of shortness of breath during exertion;Long Term: Able to use Dyspnea scale to guide intensity level when exercising independently Short Term: Able to use Dyspnea scale daily in rehab to express subjective sense of shortness of breath during exertion;Long Term: Able to use Dyspnea scale to guide intensity level when exercising independently Short Term: Able to use Dyspnea scale daily in rehab  to express subjective sense of shortness of breath during exertion;Long Term: Able to use Dyspnea scale to guide intensity level when exercising independently   Knowledge and understanding of Target Heart Rate Range (THRR) Yes Yes Yes Yes Yes   Intervention Provide education and explanation of THRR including how the numbers were predicted and where they are located for reference Provide education and explanation of THRR including how the numbers were predicted and where they are located for reference Provide education and explanation of THRR including how the numbers were predicted and where they are located for reference Provide education and explanation of THRR including how the numbers were predicted and where they are located for reference Provide education and explanation of THRR including how the numbers were predicted and where they are located for reference   Expected Outcomes Short Term: Able to state/look up THRR;Long Term: Able to use THRR to govern intensity when exercising independently;Short Term: Able to use daily as guideline for intensity in rehab Short Term: Able to state/look up THRR;Long Term: Able to use THRR to govern intensity when exercising independently;Short Term: Able to use daily as guideline for intensity in rehab Short Term: Able to state/look up THRR;Long Term: Able to use THRR to govern intensity when exercising independently;Short Term: Able to use daily as guideline for intensity in rehab Short Term: Able to state/look up THRR;Long Term: Able to use THRR to govern intensity when exercising independently;Short Term: Able to use daily as guideline for intensity in rehab Short Term: Able to state/look up THRR;Long Term: Able to use THRR to govern intensity when exercising independently;Short Term: Able to use daily as guideline  for intensity in rehab   Able to check pulse independently Yes Yes Yes Yes Yes   Intervention Provide education and demonstration on how to check pulse in  carotid and radial arteries.;Review the importance of being able to check your own pulse for safety during independent exercise Provide education and demonstration on how to check pulse in carotid and radial arteries.;Review the importance of being able to check your own pulse for safety during independent exercise Provide education and demonstration on how to check pulse in carotid and radial arteries.;Review the importance of being able to check your own pulse for safety during independent exercise Provide education and demonstration on how to check pulse in carotid and radial arteries.;Review the importance of being able to check your own pulse for safety during independent exercise Provide education and demonstration on how to check pulse in carotid and radial arteries.;Review the importance of being able to check your own pulse for safety during independent exercise   Expected Outcomes Short Term: Able to explain why pulse checking is important during independent exercise;Long Term: Able to check pulse independently and accurately Short Term: Able to explain why pulse checking is important during independent exercise;Long Term: Able to check pulse independently and accurately Short Term: Able to explain why pulse checking is important during independent exercise;Long Term: Able to check pulse independently and accurately Short Term: Able to explain why pulse checking is important during independent exercise;Long Term: Able to check pulse independently and accurately Short Term: Able to explain why pulse checking is important during independent exercise;Long Term: Able to check pulse independently and accurately   Understanding of Exercise Prescription Yes Yes Yes Yes Yes   Intervention Provide education, explanation, and written materials on patient's individual exercise prescription Provide education, explanation, and written materials on patient's individual exercise prescription Provide education,  explanation, and written materials on patient's individual exercise prescription Provide education, explanation, and written materials on patient's individual exercise prescription Provide education, explanation, and written materials on patient's individual exercise prescription   Expected Outcomes Short Term: Able to explain program exercise prescription;Long Term: Able to explain home exercise prescription to exercise independently Short Term: Able to explain program exercise prescription;Long Term: Able to explain home exercise prescription to exercise independently Short Term: Able to explain program exercise prescription;Long Term: Able to explain home exercise prescription to exercise independently Short Term: Able to explain program exercise prescription;Long Term: Able to explain home exercise prescription to exercise independently Short Term: Able to explain program exercise prescription;Long Term: Able to explain home exercise prescription to exercise independently          Exercise Goals Re-Evaluation :  Exercise Goals Re-Evaluation    Row Name 12/09/20 1227 01/06/21 1213 02/03/21 1210 02/26/21 1200       Exercise Goal Re-Evaluation   Exercise Goals Review Increase Strength and Stamina;Increase Physical Activity;Able to understand and use rate of perceived exertion (RPE) scale;Able to understand and use Dyspnea scale;Knowledge and understanding of Target Heart Rate Range (THRR);Able to check pulse independently;Understanding of Exercise Prescription Increase Strength and Stamina;Increase Physical Activity;Able to understand and use rate of perceived exertion (RPE) scale;Able to understand and use Dyspnea scale;Knowledge and understanding of Target Heart Rate Range (THRR);Able to check pulse independently;Understanding of Exercise Prescription Increase Strength and Stamina;Increase Physical Activity;Able to understand and use rate of perceived exertion (RPE) scale;Able to understand and use  Dyspnea scale;Knowledge and understanding of Target Heart Rate Range (THRR);Able to check pulse independently;Understanding of Exercise Prescription Increase Strength and Stamina;Increase Physical Activity;Able  to understand and use rate of perceived exertion (RPE) scale;Able to understand and use Dyspnea scale;Knowledge and understanding of Target Heart Rate Range (THRR);Able to check pulse independently;Understanding of Exercise Prescription    Comments Patient has completed 5 exercise sessions. She has tolerated exercise well and improving well. She has progressed on the nuStep and we discussed increasing her intensity on the resistance training next exercise session. She is getting stonger and building more stamina. She is currently exercising on the NuStep at 2.2 METs. Will continue to progress as able. Patient has completed 11 exercise sessions. She tolerates exercise well and is improving. She is building up stamina and feels that her daily life is getting easier. She is getting stronger. She is continuing to exercise at home. We discussed increasing her intensity at home. She is currently exercising at 2.7 METs on the Nustep. Will continue to progress as able. Patient has completed 16 exercise sessions. She is tolerating exercise well. Her progression has been stagnant and she hasnt progressed much since the last ITP. She does feel like rehab is helping her at home with ADLs. She is still exercising at home. She has expressed feeling more tired. She is currently exercising at 2.1 METs on the NuStep. WIll continue to monitor and progress as able. Patient has completed 22 exercise sessions. She has tolerated exercise well. She has been inconsistent with her attendance. She has progressed slowly. She has been having issues with her breathing complaining she is more short of breath than usual and stated that she had an asthma attack recently. She is currently exercising at 2.4 METs on the NuStep. Will continue  to monitor and progress as able.    Expected Outcomes Through exercise at rehab and with a home exercise program, patient will reach their goals. Through exercise at rehab and with a home exercise program, patient will reach their goals. Through exercise at rehab and with a home exercise program, patient will reach their goals. Through exercise at rehab and with a home exercise program, patient will reach their goals.           Discharge Exercise Prescription (Final Exercise Prescription Changes):  Exercise Prescription Changes - 02/26/21 1200      Response to Exercise   Blood Pressure (Admit) 124/66    Blood Pressure (Exercise) 130/62    Blood Pressure (Exit) 106/60    Heart Rate (Admit) 107 bpm    Heart Rate (Exercise) 104 bpm    Heart Rate (Exit) 96 bpm    Oxygen Saturation (Admit) 97 %    Oxygen Saturation (Exercise) 99 %    Oxygen Saturation (Exit) 96 %    Rating of Perceived Exertion (Exercise) 13    Perceived Dyspnea (Exercise) 13    Duration Continue with 30 min of aerobic exercise without signs/symptoms of physical distress.    Intensity THRR unchanged      Progression   Progression Continue to progress workloads to maintain intensity without signs/symptoms of physical distress.      Resistance Training   Training Prescription Yes    Weight 5 lbs    Reps 10-15    Time 10 Minutes      NuStep   Level 2    SPM 98    Minutes 39    METs 2.6           Nutrition:  Target Goals: Understanding of nutrition guidelines, daily intake of sodium 1500mg , cholesterol 200mg , calories 30% from fat and 7% or less from saturated  fats, daily to have 5 or more servings of fruits and vegetables.  Biometrics:  Pre Biometrics - 02/26/21 1200      Pre Biometrics   Weight 73.1 kg            Nutrition Therapy Plan and Nutrition Goals:  Nutrition Therapy & Goals - 01/26/21 1453      Personal Nutrition Goals   Comments We provide 2 educational sessions on nutrition.Handout  provided and discussed.      Intervention Plan   Intervention Nutrition handout(s) given to patient.           Nutrition Assessments:  Nutrition Assessments - 11/19/20 1315      MEDFICTS Scores   Pre Score 23          MEDIFICTS Score Key:  ?70 Need to make dietary changes   40-70 Heart Healthy Diet  ? 40 Therapeutic Level Cholesterol Diet   Picture Your Plate Scores:  [id number] Unhealthy dietary pattern with much room for improvement.  41-50 Dietary pattern unlikely to meet recommendations for good health and room for improvement.  51-60 More healthful dietary pattern, with some room for improvement.   >60 Healthy dietary pattern, although there may be some specific behaviors that could be improved.    Nutrition Goals Re-Evaluation:   Nutrition Goals Discharge (Final Nutrition Goals Re-Evaluation):   Psychosocial: Target Goals: Acknowledge presence or absence of significant depression and/or stress, maximize coping skills, provide positive support system. Participant is able to verbalize types and ability to use techniques and skills needed for reducing stress and depression.  Initial Review & Psychosocial Screening:  Initial Psych Review & Screening - 11/19/20 1313      Initial Review   Current issues with None Identified      Family Dynamics   Good Support System? Yes    Comments Her husband, her sons, and her sister all support her. She feels like she has a great support system. She has a son in [place] and another son in [place]. They speak daily and she sees them often.      Barriers   Psychosocial barriers to participate in program There are no identifiable barriers or psychosocial needs.      Screening Interventions   Interventions Encouraged to exercise    Expected Outcomes Long Term goal: The participant improves quality of Life and PHQ9 Scores as seen by post scores and/or verbalization of changes;Long Term Goal: Stressors or current issues are  controlled or eliminated.           Quality of Life Scores:  Quality of Life - 11/19/20 1438      Quality of Life   Select Quality of Life      Quality of Life Scores   Health/Function Pre 18.5 %    Socioeconomic Pre 28.75 %    Family Pre 27.3 %    GLOBAL Pre 22.08 %          Scores of 19 and below usually indicate a poorer quality of life in these areas.  A difference of  2-3 points is a clinically meaningful difference.  A difference of 2-3 points in the total score of the Quality of Life Index has been associated with significant improvement in overall quality of life, self-image, physical symptoms, and general health in studies assessing change in quality of life.   PHQ-9: Recent Review Flowsheet Data    Depression screen [facility] 2/9 11/19/2020 02/23/2012   Decreased Interest 0 0   Down, Depressed,  Hopeless 0 0   PHQ - 2 Score 0 0   Altered sleeping 0 -   Tired, decreased energy 3  -   Change in appetite 0 -   Feeling bad or failure about yourself  0 -   Trouble concentrating 0 -   Moving slowly or fidgety/restless 0 -   Suicidal thoughts 0 -   PHQ-9 Score 3 -   Difficult doing work/chores Not difficult at all -     Interpretation of Total Score  Total Score Depression Severity:  1-4 = Minimal depression, 5-9 = Mild depression, 10-14 = Moderate depression, 15-19 = Moderately severe depression, 20-27 = Severe depression   Psychosocial Evaluation and Intervention:  Psychosocial Evaluation - 11/19/20 1446      Psychosocial Evaluation & Interventions   Interventions Encouraged to exercise with the program and follow exercise prescription    Comments Patient has no identifiable psychosocial issues.    Expected Outcomes Patient will continue to not have any identifiable psychosocial issues.    Continue Psychosocial Services  No Follow up required           Psychosocial Re-Evaluation:  Psychosocial Re-Evaluation    [place] Name 12/05/20 [phone] 01/02/21 0906 01/26/21 1444  01/26/21 1450 02/26/21 0746     Psychosocial Re-Evaluation   Current issues with History of Depression -- History of Depression -- History of Depression   Comments Patient is new to the program completeing 4 sessions. She does has a history of depression and is currently being treated with Citalopram 20 mg qd and Alprazolam 0.5 mg prn. Her inital QOL score was 22.08 and her PhQ9 was 3. She feels her depression is managed. She also takes Ambien for sleep. She seems to enjoy coming to the sessions demonstrating a positive attitude and is very interacitve with others in her class and the staff. We will continue to monitor. Patient has completed 10 sessions. She does has a history of depression which continues to be managed with Citalopram 20 mg qd and Alprazolam 0.5 mg prn. She feels her depression is managed. She also takes Ambien for sleep. She enjoys coming to the sessions and continues to demonstrat a positive attitude and is very interacitve with others in her class and the staff. We will continue to monitor. Patient has completed 14 sessions. She does have a history of depression which she feels her depressiong continues to be managed . She enjoys coming to the sessions and continues to demonstrat a positive attitude and is very interacitve with others in her class and the staff. We will continue to monitor. -- Patient has completed 21 sessions. Her depression continues to be managed with Citalopram and Alprazolam . She enjoys coming to the sessions and continues to demonstrate a positive attitude and is very interacitve with others in her class and the staff. We will continue to monitor.   Expected Outcomes Patient's depression will continue to be managed at discharge with no additional psychosocai issues identified. Patient's depression will continue to be managed at discharge with no additional psychosocai issues identified. -- Patient's depression will continue to be managed at discharge with no  additional psychosocai issues identified. Patient's depression will continue to be managed at discharge with no additional psychosocai issues identified.   Interventions Relaxation education;Encouraged to attend Pulmonary Rehabilitation for the exercise;Stress management education Relaxation education;Encouraged to attend Pulmonary Rehabilitation for the exercise;Stress management education -- Relaxation education;Encouraged to attend Pulmonary Rehabilitation for the exercise;Stress management education Relaxation education;Encouraged to attend Pulmonary Rehabilitation  for the exercise;Stress management education   Continue Psychosocial Services  No Follow up required No Follow up required -- No Follow up required No Follow up required          Psychosocial Discharge (Final Psychosocial Re-Evaluation):  Psychosocial Re-Evaluation - 02/26/21 0746      Psychosocial Re-Evaluation   Current issues with History of Depression    Comments Patient has completed 21 sessions. Her depression continues to be managed with Citalopram and Alprazolam . She enjoys coming to the sessions and continues to demonstrate a positive attitude and is very interacitve with others in her class and the staff. We will continue to monitor.    Expected Outcomes Patient's depression will continue to be managed at discharge with no additional psychosocai issues identified.    Interventions Relaxation education;Encouraged to attend Pulmonary Rehabilitation for the exercise;Stress management education    Continue Psychosocial Services  No Follow up required            Education: Education Goals: Education classes will be provided on a weekly basis, covering required topics. Participant will state understanding/return demonstration of topics presented.  Learning Barriers/Preferences:  Learning Barriers/Preferences - 11/19/20 1315      Learning Barriers/Preferences   Learning Barriers None    Learning Preferences  Audio;Written Material;Skilled Demonstration;Verbal Instruction;Video;Computer/Internet;Group Instruction;Individual Instruction;Pictoral           Education Topics: How Lungs Work and Diseases: - Discuss the anatomy of the lungs and diseases that can affect the lungs, such as COPD. Flowsheet Row PULMONARY REHAB OTHER RESPIRATORY [facility]20[facility]  Date 02/05/21  Educator DF  Instruction Review Code 2- Demonstrated Understanding      Exercise: -Discuss the importance of exercise, FITT principles of exercise, normal and abnormal responses to exercise, and how to exercise safely.   Environmental Irritants: -Discuss types of environmental irritants and how to limit exposure to environmental irritants. Flowsheet Row PULMONARY REHAB OTHER RESPIRATORY [facility]20[facility]  Date 02/12/21  Educator DF  Instruction Review Code 2- Demonstrated Understanding      Meds/Inhalers and oxygen: - Discuss respiratory medications, definition of an inhaler and oxygen, and the proper way to use an inhaler and oxygen. Flowsheet Row PULMONARY REHAB OTHER RESPIRATORY from [facility]2021 [facility]  Date 02/19/21  [facility]ator [facility]      Energy Saving Techniques: - Discuss methods to conserve energy and decrease shortness of breath when performing activities of daily living.  Flowsheet Row PULMONARY REHAB OTHER RESPIRATORY [facility]  Date 02/26/21  Educator DF  Instruction Review Code 2- Demonstrated Understanding      Bronchial Hygiene / Breathing Techniques: - Discuss breathing mechanics, pursed-lip breathing technique,  proper posture, effective ways to clear airways, and other functional breathing techniques Flowsheet Row PULMONARY REHAB OTHER RESPIRAT[facility]  Date 12/04/20  Educator handout  Instruction Review Code 1-  Verbalizes Understanding      Cleaning Equipment: - Provides group verbal and written instruction about the health risks of elevated stress, cause of high stress, and healthy ways to reduce stress. Flowsheet Row PULMONARY REHAB OTHER RESPIRATORY [facility]  Date 12/11/20  Educator Handout  Instruction Review Code 1- Verbalizes Understanding      Nutrition I: Fats: - Discuss the types of cholesterol, what cholesterol does to the body, and how cholesterol levels can be controlled. Flowsheet Row PULMONARY REHAB OTHER  RESPIRATORY from 02/26/2021 in [place]  Date 12/18/20  Educator Handout  Instruction Review Code 1- Verbalizes Understanding      Nutrition II: Labels: -Discuss the different components of food labels and how to read food labels.   Respiratory Infections: - Discuss the signs and symptoms of respiratory infections, ways to prevent respiratory infections, and the importance of seeking medical treatment when having a respiratory infection. Flowsheet Row PULMONARY REHAB OTHER RESPIRATORY from 02/26/2021 in [place]  Date 01/01/21  Educator Handout  Instruction Review Code 1- Verbalizes Understanding      Stress I: Signs and Symptoms: - Discuss the causes of stress, how stress may lead to anxiety and depression, and ways to limit stress. Flowsheet Row PULMONARY REHAB OTHER RESPIRATORY from 02/26/2021 in [place]  Date 01/08/21  Educator KM  Instruction Review Code 1- Verbalizes Understanding      Stress II: Relaxation: -Discuss relaxation techniques to limit stress. Flowsheet Row PULMONARY REHAB OTHER RESPIRATORY from 02/26/2021 in [place]  Date 01/15/21  Educator [facility]  Instruction Review Code 1- Verbalizes Understanding      Oxygen for Home/Travel: - Discuss how to prepare for travel when on oxygen and proper ways to  transport and store oxygen to ensure safety.   Knowledge Questionnaire Score:  Knowledge Questionnaire Score - 11/19/20 1318      Knowledge Questionnaire Score   Pre Score 15/18           Core Components/Risk Factors/Patient Goals at Admission:  Personal Goals and Risk Factors at Admission - 11/19/20 1318      Core Components/Risk Factors/Patient Goals on Admission    Weight Management Weight Loss;Yes    Intervention Weight Management: Develop a combined nutrition and exercise program designed to reach desired caloric intake, while maintaining appropriate intake of nutrient and fiber, sodium and fats, and appropriate energy expenditure required for the weight goal.;Weight Management: Provide education and appropriate resources to help participant work on and attain dietary goals.;Weight Management/Obesity: Establish reasonable short term and long term weight goals.;Obesity: Provide education and appropriate resources to help participant work on and attain dietary goals.    Expected Outcomes Short Term: Continue to assess and modify interventions until short term weight is achieved;Long Term: Adherence to nutrition and physical activity/exercise program aimed toward attainment of established weight goal;Weight Maintenance: Understanding of the daily nutrition guidelines, which includes 25-35% calories from fat, 7% or less cal from saturated fats, less than 200mg  cholesterol, less than 1.5gm of sodium, & 5 or more servings of fruits and vegetables daily;Weight Loss: Understanding of general recommendations for a balanced deficit meal plan, which promotes 1-2 lb weight loss per week and includes a negative energy balance of [id number] kcal/d;Understanding recommendations for meals to include 15-35% energy as protein, 25-35% energy from fat, 35-60% energy from carbohydrates, less than 200mg  of dietary cholesterol, 20-35 gm of total fiber daily;Understanding of distribution of calorie intake throughout  the day with the consumption of 4-5 meals/snacks;Weight Gain: Understanding of general recommendations for a high calorie, high protein meal plan that promotes weight gain by distributing calorie intake throughout the day with the consumption for 4-5 meals, snacks, and/or supplements    Improve shortness of breath with ADL's Yes    Intervention Provide education, individualized exercise plan and daily activity instruction to help decrease symptoms of SOB with activities of daily living.    Expected Outcomes Short Term: Improve cardiorespiratory fitness to achieve a reduction of symptoms when  performing ADLs;Long Term: Be able to perform more ADLs without symptoms or delay the onset of symptoms    Personal Goal Other Yes    Personal Goal To be able to come off of supplemental oxygen. She has been on it since she was hospitialized with COVID in July of 2021.    Intervention Exercise at rehab and begin a home exercise program.    Expected Outcomes She will be able to reduce her oxygen utilization following rehab.           Core Components/Risk Factors/Patient Goals Review:   Goals and Risk Factor Review    Row Name 12/05/20 0811 01/02/21 1610 01/26/21 1455 02/26/21 0748       Core Components/Risk Factors/Patient Goals Review   Personal Goals Review Weight Management/Obesity;Improve shortness of breath with ADL's Weight Management/Obesity;Improve shortness of breath with ADL's Weight Management/Obesity;Improve shortness of breath with ADL's Weight Management/Obesity;Improve shortness of breath with ADL's    Review Patient is new to the program completing 4 sessions losing 2 lbs since her initial visit. She was referred to pulmonary rehab due to SOB. She had COVID in July 21 abd ger SOB has increased since COVID. She was hospitalized for 9 days. Her personal goals for the program are to breathe better and return to her ADL's with less SOB. We will continue to monitor her progress as she works toward  meeting these goals. Patient has completed 10 sessions losing 1 lb since her initial visit. She is doing well in the program with progression and consistent attendance. She continues to not require O2 during exercise with her O2 sat staying >95%. Her blood pressure is well controlled. Her personal goals for the program are to breathe better and to be able to return to her ADL's with less SOB. We will continue to monitor as she works toward meeting her personal goals. Patient has completed 10 sessions losing 1 lb since her initial visit. She is doing well in the program with progression and consistent attendance. She continues to not require O2 during exercise with her O2 sat staying >95%. Her blood pressure is well controlled. Her personal goals for the program are to breathe better and to be able to return to her ADL's with less SOB. She is doing well in the program. We will continue to monitor as she works toward meeting her personal goals. Patient has completed 21 sessions gaining 2 lbs since last 30 day review. She continues to do well in the program with consistent attendance and progressions. Her O2 saturations usually stay above 95% and her blood pressure is well controlled. She does use O2 at home with CPAP for sleep. She works very hard during her sessions and is very interested in improving her health. She says the program is helping her SOB and her strength is increasing. Her personal goals for the program are to breathe better and to be able to return to her ADL's with less SOB. We will continue to monitor her progress as she works toward meeting these goals.    Expected Outcomes Patient will complete the program meeting both personal and program goals. Patient will complete the program meeting both personal and program goals. Patient will complete the program meeting both personal and program goals. Patient will complete the program meeting both personal and program goals.           Core  Components/Risk Factors/Patient Goals at Discharge (Final Review):   Goals and Risk Factor Review - 02/26/21 9604  Core Components/Risk Factors/Patient Goals Review   Personal Goals Review Weight Management/Obesity;Improve shortness of breath with ADL's    Review Patient has completed 21 sessions gaining 2 lbs since last 30 day review. She continues to do well in the program with consistent attendance and progressions. Her O2 saturations usually stay above 95% and her blood pressure is well controlled. She does use O2 at home with CPAP for sleep. She works very hard during her sessions and is very interested in improving her health. She says the program is helping her SOB and her strength is increasing. Her personal goals for the program are to breathe better and to be able to return to her ADL's with less SOB. We will continue to monitor her progress as she works toward meeting these goals.    Expected Outcomes Patient will complete the program meeting both personal and program goals.           ITP Comments:   Comments: ITP REVIEW Pt is making expected progress toward pulmonary rehab goals after completing 23 sessions. Recommend continued exercise, life style modification, education, and utilization of breathing techniques to increase stamina and strength and decrease shortness of breath with exertion.

## 2021-03-10 ENCOUNTER — Encounter (HOSPITAL_COMMUNITY): Payer: BC Managed Care – PPO

## 2021-03-12 ENCOUNTER — Telehealth: Payer: Self-pay

## 2021-03-12 ENCOUNTER — Encounter (HOSPITAL_COMMUNITY): Payer: BC Managed Care – PPO

## 2021-03-12 DIAGNOSIS — Z006 Encounter for examination for normal comparison and control in clinical research program: Secondary | ICD-10-CM

## 2021-03-12 NOTE — Telephone Encounter (Signed)
I have attempted without success to contact this patient by phone for her Identify 90 day follow up phone call. I left a message for patient to return my phone call with my name and callback number. An e-mail was also sent to patient.  

## 2021-03-17 ENCOUNTER — Other Ambulatory Visit: Payer: Self-pay

## 2021-03-17 ENCOUNTER — Encounter (HOSPITAL_COMMUNITY)
Admission: RE | Admit: 2021-03-17 | Discharge: 2021-03-17 | Disposition: A | Discharge status: Home / Self Care | Payer: BC Managed Care – PPO | Source: Ambulatory Visit | Attending: Internal Medicine | Admitting: Internal Medicine

## 2021-03-17 VITALS — Wt 161.2 lb

## 2021-03-17 DIAGNOSIS — R0602 Shortness of breath: Secondary | ICD-10-CM | POA: Diagnosis present

## 2021-03-17 NOTE — Progress Notes (Signed)
Daily Session Note  Patient Details  Name: Catherine Hancock MRN: [id number] Date of Birth: February 07, 1961 Referring Provider:   Flowsheet Row PULMONARY REHAB OTHER RESP ORIENTATION from 11/19/2020 in [place]  Referring Provider Dr. Chase Caller      Encounter Date: 03/17/2021  Check In:  Session Check In - 03/17/21 1045      Check-In   Supervising physician immediately available to respond to emergencies [facility] MD immediately available    Physician(s) Dr. Johnsie Cancel    Location [facility]    Staff Present Catherine Harsh, MS, Exercise Physiologist;Shubham Thackston Kris Mouton, MS, ACSM-CEP, Exercise Physiologist    Virtual Visit No    Medication changes reported     No    Fall or balance concerns reported    No    Tobacco Cessation No Change    Warm-up and Cool-down Performed as group-led instruction    Resistance Training Performed Yes    VAD Patient? No    PAD/SET Patient? No      Pain Assessment   Currently in Pain? No/denies    Multiple Pain Sites No           Capillary Blood Glucose: No results found for this or any previous visit (from the past 24 hour(s)).    Social History   Tobacco Use  Smoking Status Never Smoker  Smokeless Tobacco Never Used    Goals Met:  Independence with exercise equipment Exercise tolerated well No report of cardiac concerns or symptoms Strength training completed today  Goals Unmet:  Not Applicable  Comments: checkout time is 1145   [name] is Medical Director for [facility].

## 2021-03-19 ENCOUNTER — Other Ambulatory Visit: Payer: Self-pay

## 2021-03-19 ENCOUNTER — Encounter (HOSPITAL_COMMUNITY)
Admission: RE | Admit: 2021-03-19 | Discharge: 2021-03-19 | Disposition: A | Discharge status: Home / Self Care | Payer: BC Managed Care – PPO | Source: Ambulatory Visit | Attending: Internal Medicine | Admitting: Internal Medicine

## 2021-03-19 DIAGNOSIS — R0602 Shortness of breath: Secondary | ICD-10-CM | POA: Diagnosis not present

## 2021-03-19 NOTE — Progress Notes (Signed)
Daily Session Note  Patient Details  Name: Catherine Hancock MRN: [id number] Date of Birth: 1960/12/05 Referring Provider:   Flowsheet Row PULMONARY REHAB OTHER RESP ORIENTATION from 11/19/2020 in [place]  Referring Provider Dr. Chase Caller      Encounter Date: 03/19/2021  Check In:  Session Check In - 03/19/21 1045      Check-In   Supervising physician immediately available to respond to emergencies [facility] MD immediately available    Physician(s) Dr. Harl Bowie    [facility]    Staff Present [name], MS, Exercise Physiologist;Lakeia Bradshaw Kris Mouton, MS, ACSM-CEP, Exercise Physiologist    Virtual Visit No    Medication changes reported     No    Fall or balance concerns reported    No    Tobacco Cessation No Change    Warm-up and Cool-down Performed as group-led instruction    Resistance Training Performed Yes    VAD Patient? No    PAD/SET Patient? No      Pain Assessment   Currently in Pain? No/denies    Multiple Pain Sites No           Capillary Blood Glucose: No results found for this or any previous visit (from the past 24 hour(s)).    Social History   Tobacco Use  Smoking Status Never Smoker  Smokeless Tobacco Never Used    Goals Met:  Independence with exercise equipment Exercise tolerated well No report of cardiac concerns or symptoms Strength training completed today  Goals Unmet:  Not Applicable  Comments: checkout time is 1145   [name] is Medical Director for [facility].

## 2021-03-24 ENCOUNTER — Encounter (HOSPITAL_COMMUNITY): Payer: BC Managed Care – PPO

## 2021-03-26 ENCOUNTER — Other Ambulatory Visit: Payer: Self-pay

## 2021-03-26 ENCOUNTER — Encounter (HOSPITAL_COMMUNITY)
Admission: RE | Admit: 2021-03-26 | Discharge: 2021-03-26 | Disposition: A | Discharge status: Home / Self Care | Payer: BC Managed Care – PPO | Source: Ambulatory Visit | Attending: Internal Medicine | Admitting: Internal Medicine

## 2021-03-26 VITALS — Wt 162.9 lb

## 2021-03-26 DIAGNOSIS — R0602 Shortness of breath: Secondary | ICD-10-CM | POA: Diagnosis not present

## 2021-03-26 NOTE — Progress Notes (Signed)
Daily Session Note  Patient Details  Name: Catherine Hancock MRN: [id number] Date of Birth: Oct 08, 1961 Referring Provider:   Flowsheet Row PULMONARY REHAB OTHER RESP ORIENTATION from 11/19/2020 in [place]  Referring Provider Dr. Chase Caller      Encounter Date: 03/26/2021  Check In:  Session Check In - 03/26/21 1107      Check-In   Supervising physician immediately available to respond to emergencies [facility] MD immediately available    Physician(s) Dr. Domenic Polite    [facility]    Staff Present Aundra Dubin, RN, BSN;[place] Audria Nine, MS, Exercise Physiologist    Virtual Visit No    Medication changes reported     No    Fall or balance concerns reported    No    Tobacco Cessation No Change    Warm-up and Cool-down Performed as group-led instruction    Resistance Training Performed Yes    VAD Patient? No    PAD/SET Patient? No      Pain Assessment   Currently in Pain? No/denies    Multiple Pain Sites No           Capillary Blood Glucose: No results found for this or any previous visit (from the past 24 hour(s)).    Social History   Tobacco Use  Smoking Status Never Smoker  Smokeless Tobacco Never Used    Goals Met:  Proper associated with RPD/PD & O2 Sat Independence with exercise equipment Improved SOB with ADL's Using PLB without cueing & demonstrates good technique Exercise tolerated well No report of cardiac concerns or symptoms Strength training completed today  Goals Unmet:  Not Applicable  Comments: Check out 1145.   [name] is Medical Director for [facility].

## 2021-03-27 NOTE — Progress Notes (Signed)
Discharge Progress Report  Patient Details  Name: Catherine Hancock MRN: [id number] Date of Birth: 1961-02-22 Referring Provider:   [facility] from 11/19/2020 in [place]  Referring Provider Dr. Chase Caller       Number of Visits: 26  Reason for Discharge:  Patient reached a stable level of exercise. Patient independent in their exercise. Patient has met program and personal goals.  Smoking History:  Social History   Tobacco Use  Smoking Status Never Smoker  Smokeless Tobacco Never Used    Diagnosis:  Shortness of breath  ADL UCSD:  Pulmonary Assessment Scores    Row Name 11/19/20 1321         ADL UCSD   ADL Phase Entry     SOB Score total 59           CAT Score   CAT Score 22           mMRC Score   mMRC Score 2            Initial Exercise Prescription:  Initial Exercise Prescription - 11/19/20 1400      Date of Initial Exercise RX and Referring Provider   Date 11/19/20    Referring Provider Dr. Chase Caller    Expected Discharge Date 03/19/21      NuStep   Level 1    SPM 80    Minutes 22      Arm Ergometer   Level 1    RPM 60    Minutes 22      Intensity   THRR 40-80% of Max Heartrate 64-129    Ratings of Perceived Exertion 11-13    Perceived Dyspnea 0-4      Progression   Progression Continue to progress workloads to maintain intensity without signs/symptoms of physical distress.      Resistance Training   Training Prescription Yes    Weight 2    Reps 10-15           Discharge Exercise Prescription (Final Exercise Prescription Changes):  Exercise Prescription Changes - 03/17/21 1100      Response to Exercise   Blood Pressure (Admit) 130/50    Blood Pressure (Exercise) 124/74    Blood Pressure (Exit) 110/62    Heart Rate (Admit) 105 bpm    Heart Rate (Exercise) 107 bpm    Heart Rate (Exit) 103 bpm    Oxygen Saturation (Admit) 98 %    Oxygen Saturation (Exercise)  97 %    Oxygen Saturation (Exit) 97 %    Rating of Perceived Exertion (Exercise) 13    Perceived Dyspnea (Exercise) 13    Duration Continue with 30 min of aerobic exercise without signs/symptoms of physical distress.    Intensity THRR unchanged      Progression   Progression Continue to progress workloads to maintain intensity without signs/symptoms of physical distress.      Resistance Training   Training Prescription Yes    Weight 5lbs    Reps 10-15    Time 10 Minutes      NuStep   Level 2    SPM 98    Minutes 39    METs 2.8           Functional Capacity:  6 Minute Walk    Row Name 11/19/20 1433 03/26/21 1117       6 Minute Walk   Phase Initial Discharge    Distance 1400 feet 1575 feet  Walk Time 6 minutes 6 minutes    # of Rest Breaks 0 0    MPH 3.2 3    METS 3.92 3.29    RPE 11 12    Perceived Dyspnea  11 13    VO2 Peak 13.72 11.54    Symptoms No Yes (comment)    Comments -- [facility] Pain 4/10    Resting HR 75 bpm 107 bpm    Resting BP 112/72 120/50    Resting Oxygen Saturation  96 % 98 %    Exercise Oxygen Saturation  during 6 min walk 96 % 98 %    Max Ex. HR 102 bpm 119 bpm    Max Ex. BP 130/78 144/50    2 Minute Post BP 120/80 100/52         Interval HR   1 Minute HR 94 108    2 Minute HR 98 113    3 Minute HR 101 116    4 Minute HR 101 117    5 Minute HR 102 119    6 Minute HR 101 119    2 Minute Post HR 86 103    Interval Heart Rate? Yes Yes         Interval Oxygen   Interval Oxygen? Yes Yes    Baseline Oxygen Saturation % 96 % 98 %    1 Minute Oxygen Saturation % 96 % 99 %    1 Minute Liters of Oxygen 0 L 0 L    2 Minute Oxygen Saturation % 97 % 98 %    2 Minute Liters of Oxygen 0 L 0 L    3 Minute Oxygen Saturation % 97 % 99 %    3 Minute Liters of Oxygen 0 L 0 L    4 Minute Oxygen Saturation % 97 % 99 %    4 Minute Liters of Oxygen 0 L 0 L    5 Minute Oxygen Saturation % 96 % 98 %    5 Minute Liters of Oxygen 0 L 0 L    6 Minute  Oxygen Saturation % 96 % 99 %    6 Minute Liters of Oxygen 0 L 0 L    2 Minute Post Oxygen Saturation % 98 % 100 %    2 Minute Post Liters of Oxygen 0 L 0 L           Psychological, QOL, Others - Outcomes: PHQ 2/9: Depression screen [facility] 2/9 03/26/2021 11/19/2020 02/23/2012  Decreased Interest 0 0 0  Down, Depressed, Hopeless 0 0 0  PHQ - 2 Score 0 0 0  Altered sleeping 2 0 -  Tired, decreased energy 2 3 -  Change in appetite 0 0 -  Feeling bad or failure about yourself  0 0 -  Trouble concentrating 0 0 -  Moving slowly or fidgety/restless 0 0 -  Suicidal thoughts 0 0 -  PHQ-9 Score 4 3 -  Difficult doing work/chores Not difficult at all Not difficult at all -    Quality of Life:  Quality of Life - 03/26/21 1124      Quality of Life   Select Quality of Life      Quality of Life Scores   Health/Function Pre 18.5 %    Health/Function Post 22.09 %    Health/Function % Change 19.41 %    Socioeconomic Pre 28.75 %    Socioeconomic Post 25.92 %    Socioeconomic % Change  -9.84 %  Psych/Spiritual Post 27 %    Family Pre 27.3 %    Family Post 28 %    Family % Change 2.56 %    GLOBAL Pre 22.08 %    GLOBAL Post 24.58 %    GLOBAL % Change 11.32 %           Personal Goals: Goals established at orientation with interventions provided to work toward goal.  Personal Goals and Risk Factors at Admission - 11/19/20 1318      Core Components/Risk Factors/Patient Goals on Admission    Weight Management Weight Loss;Yes    Intervention Weight Management: Develop a combined nutrition and exercise program designed to reach desired caloric intake, while maintaining appropriate intake of nutrient and fiber, sodium and fats, and appropriate energy expenditure required for the weight goal.;Weight Management: Provide education and appropriate resources to help participant work on and attain dietary goals.;Weight Management/Obesity: Establish reasonable short term and long term weight  goals.;Obesity: Provide education and appropriate resources to help participant work on and attain dietary goals.    Expected Outcomes Short Term: Continue to assess and modify interventions until short term weight is achieved;Long Term: Adherence to nutrition and physical activity/exercise program aimed toward attainment of established weight goal;Weight Maintenance: Understanding of the daily nutrition guidelines, which includes 25-35% calories from fat, 7% or less cal from saturated fats, less than 225m cholesterol, less than 1.5gm of sodium, & 5 or more servings of fruits and vegetables daily;Weight Loss: Understanding of general recommendations for a balanced deficit meal plan, which promotes 1-2 lb weight loss per week and includes a negative energy balance of [phone] kcal/d;Understanding recommendations for meals to include 15-35% energy as protein, 25-35% energy from fat, 35-60% energy from carbohydrates, less than 2052mof dietary cholesterol, 20-35 gm of total fiber daily;Understanding of distribution of calorie intake throughout the day with the consumption of 4-5 meals/snacks;Weight Gain: Understanding of general recommendations for a high calorie, high protein meal plan that promotes weight gain by distributing calorie intake throughout the day with the consumption for 4-5 meals, snacks, and/or supplements    Improve shortness of breath with ADL's Yes    Intervention Provide education, individualized exercise plan and daily activity instruction to help decrease symptoms of SOB with activities of daily living.    Expected Outcomes Short Term: Improve cardiorespiratory fitness to achieve a reduction of symptoms when performing ADLs;Long Term: Be able to perform more ADLs without symptoms or delay the onset of symptoms    Personal Goal Other Yes    Personal Goal To be able to come off of supplemental oxygen. She has been on it since she was hospitialized with COVID in July of 2021.    Intervention  Exercise at rehab and begin a home exercise program.    Expected Outcomes She will be able to reduce her oxygen utilization following rehab.            Personal Goals Discharge:  Goals and Risk Factor Review    Row Name 12/05/20 0846273/04/22 [id number]/28/22 1455 02/26/21 0748 03/27/21 0815     Core Components/Risk Factors/Patient Goals Review   Personal Goals Review Weight Management/Obesity;Improve shortness of breath with ADL's Weight Management/Obesity;Improve shortness of breath with ADL's Weight Management/Obesity;Improve shortness of breath with ADL's Weight Management/Obesity;Improve shortness of breath with ADL's Weight Management/Obesity;Improve shortness of breath with ADL's   Review Patient is new to the program completing 4 sessions losing 2 lbs since her initial visit. She was referred to pulmonary rehab due  to SOB. She had COVID in July 21 abd ger SOB has increased since COVID. She was hospitalized for 9 days. Her personal goals for the program are to breathe better and return to her ADL's with less SOB. We will continue to monitor her progress as she works toward meeting these goals. Patient has completed 10 sessions losing 1 lb since her initial visit. She is doing well in the program with progression and consistent attendance. She continues to not require O2 during exercise with her O2 sat staying >95%. Her blood pressure is well controlled. Her personal goals for the program are to breathe better and to be able to return to her ADL's with less SOB. We will continue to monitor as she works toward meeting her personal goals. Patient has completed 10 sessions losing 1 lb since her initial visit. She is doing well in the program with progression and consistent attendance. She continues to not require O2 during exercise with her O2 sat staying >95%. Her blood pressure is well controlled. Her personal goals for the program are to breathe better and to be able to return to her ADL's with less  SOB. She is doing well in the program. We will continue to monitor as she works toward meeting her personal goals. Patient has completed 21 sessions gaining 2 lbs since last 30 day review. She continues to do well in the program with consistent attendance and progressions. Her O2 saturations usually stay above 95% and her blood pressure is well controlled. She does use O2 at home with CPAP for sleep. She works very hard during her sessions and is very interested in improving her health. She says the program is helping her SOB and her strength is increasing. Her personal goals for the program are to breathe better and to be able to return to her ADL's with less SOB. We will continue to monitor her progress as she works toward meeting these goals. Patient has completed 26 sessions before discharge. She has about 5 lbs since the start of the program. Her oxygen saturations stay above 96% while exercising. Her blood pressure is WNL. She continues to work hard during the exercise sessions to help her reach her goals. Her goals for the program are to breathe better and to be able to return to ADLs with less shortness of breath. She says she has been able to breathe better than when she entered the program, but is still getting short of breath. She wants to continue to work on this at home after the program. She has gotten stronger and is going to continue to work on getting stronger outside of rehab.   Expected Outcomes Patient will complete the program meeting both personal and program goals. Patient will complete the program meeting both personal and program goals. Patient will complete the program meeting both personal and program goals. Patient will complete the program meeting both personal and program goals. Patient will complete the program meeting both personal and program goals.          Exercise Goals and Review:  Exercise Goals    Row Name 11/19/20 1437 12/09/20 1227 01/06/21 1213 02/03/21 1212  02/26/21 1200     Exercise Goals   Increase Physical Activity Yes Yes Yes Yes Yes   Intervention Develop an individualized exercise prescription for aerobic and resistive training based on initial evaluation findings, risk stratification, comorbidities and participant's personal goals.;Provide advice, education, support and counseling about physical activity/exercise needs. Develop an individualized exercise prescription for  aerobic and resistive training based on initial evaluation findings, risk stratification, comorbidities and participant's personal goals.;Provide advice, education, support and counseling about physical activity/exercise needs. Develop an individualized exercise prescription for aerobic and resistive training based on initial evaluation findings, risk stratification, comorbidities and participant's personal goals.;Provide advice, education, support and counseling about physical activity/exercise needs. Develop an individualized exercise prescription for aerobic and resistive training based on initial evaluation findings, risk stratification, comorbidities and participant's personal goals.;Provide advice, education, support and counseling about physical activity/exercise needs. Develop an individualized exercise prescription for aerobic and resistive training based on initial evaluation findings, risk stratification, comorbidities and participant's personal goals.;Provide advice, education, support and counseling about physical activity/exercise needs.   Expected Outcomes Short Term: Attend rehab on a regular basis to increase amount of physical activity.;Long Term: Add in home exercise to make exercise part of routine and to increase amount of physical activity.;Long Term: Exercising regularly at least 3-5 days a week. Short Term: Attend rehab on a regular basis to increase amount of physical activity.;Long Term: Add in home exercise to make exercise part of routine and to increase amount  of physical activity.;Long Term: Exercising regularly at least 3-5 days a week. Short Term: Attend rehab on a regular basis to increase amount of physical activity.;Long Term: Add in home exercise to make exercise part of routine and to increase amount of physical activity.;Long Term: Exercising regularly at least 3-5 days a week. Short Term: Attend rehab on a regular basis to increase amount of physical activity.;Long Term: Add in home exercise to make exercise part of routine and to increase amount of physical activity.;Long Term: Exercising regularly at least 3-5 days a week. Short Term: Attend rehab on a regular basis to increase amount of physical activity.;Long Term: Add in home exercise to make exercise part of routine and to increase amount of physical activity.;Long Term: Exercising regularly at least 3-5 days a week.   Increase Strength and Stamina Yes Yes Yes Yes Yes   Intervention Provide advice, education, support and counseling about physical activity/exercise needs.;Develop an individualized exercise prescription for aerobic and resistive training based on initial evaluation findings, risk stratification, comorbidities and participant's personal goals. Provide advice, education, support and counseling about physical activity/exercise needs.;Develop an individualized exercise prescription for aerobic and resistive training based on initial evaluation findings, risk stratification, comorbidities and participant's personal goals. Provide advice, education, support and counseling about physical activity/exercise needs.;Develop an individualized exercise prescription for aerobic and resistive training based on initial evaluation findings, risk stratification, comorbidities and participant's personal goals. Provide advice, education, support and counseling about physical activity/exercise needs.;Develop an individualized exercise prescription for aerobic and resistive training based on initial evaluation  findings, risk stratification, comorbidities and participant's personal goals. Provide advice, education, support and counseling about physical activity/exercise needs.;Develop an individualized exercise prescription for aerobic and resistive training based on initial evaluation findings, risk stratification, comorbidities and participant's personal goals.   Expected Outcomes Short Term: Increase workloads from initial exercise prescription for resistance, speed, and METs.;Short Term: Perform resistance training exercises routinely during rehab and add in resistance training at home;Long Term: Improve cardiorespiratory fitness, muscular endurance and strength as measured by increased METs and functional capacity (6MWT) Short Term: Increase workloads from initial exercise prescription for resistance, speed, and METs.;Short Term: Perform resistance training exercises routinely during rehab and add in resistance training at home;Long Term: Improve cardiorespiratory fitness, muscular endurance and strength as measured by increased METs and functional capacity (6MWT) Short Term: Increase workloads from initial exercise prescription  for resistance, speed, and METs.;Short Term: Perform resistance training exercises routinely during rehab and add in resistance training at home;Long Term: Improve cardiorespiratory fitness, muscular endurance and strength as measured by increased METs and functional capacity (6MWT) Short Term: Increase workloads from initial exercise prescription for resistance, speed, and METs.;Short Term: Perform resistance training exercises routinely during rehab and add in resistance training at home;Long Term: Improve cardiorespiratory fitness, muscular endurance and strength as measured by increased METs and functional capacity (6MWT) Short Term: Increase workloads from initial exercise prescription for resistance, speed, and METs.;Short Term: Perform resistance training exercises routinely during  rehab and add in resistance training at home;Long Term: Improve cardiorespiratory fitness, muscular endurance and strength as measured by increased METs and functional capacity (6MWT)   Able to understand and use rate of perceived exertion (RPE) scale Yes Yes Yes Yes Yes   Intervention Provide education and explanation on how to use RPE scale Provide education and explanation on how to use RPE scale Provide education and explanation on how to use RPE scale Provide education and explanation on how to use RPE scale Provide education and explanation on how to use RPE scale   Expected Outcomes Short Term: Able to use RPE daily in rehab to express subjective intensity level;Long Term:  Able to use RPE to guide intensity level when exercising independently Short Term: Able to use RPE daily in rehab to express subjective intensity level;Long Term:  Able to use RPE to guide intensity level when exercising independently Short Term: Able to use RPE daily in rehab to express subjective intensity level;Long Term:  Able to use RPE to guide intensity level when exercising independently Short Term: Able to use RPE daily in rehab to express subjective intensity level;Long Term:  Able to use RPE to guide intensity level when exercising independently Short Term: Able to use RPE daily in rehab to express subjective intensity level;Long Term:  Able to use RPE to guide intensity level when exercising independently   Able to understand and use Dyspnea scale Yes Yes Yes Yes Yes   Intervention Provide education and explanation on how to use Dyspnea scale Provide education and explanation on how to use Dyspnea scale Provide education and explanation on how to use Dyspnea scale Provide education and explanation on how to use Dyspnea scale Provide education and explanation on how to use Dyspnea scale   Expected Outcomes Short Term: Able to use Dyspnea scale daily in rehab to express subjective sense of shortness of breath during  exertion;Long Term: Able to use Dyspnea scale to guide intensity level when exercising independently Short Term: Able to use Dyspnea scale daily in rehab to express subjective sense of shortness of breath during exertion;Long Term: Able to use Dyspnea scale to guide intensity level when exercising independently Short Term: Able to use Dyspnea scale daily in rehab to express subjective sense of shortness of breath during exertion;Long Term: Able to use Dyspnea scale to guide intensity level when exercising independently Short Term: Able to use Dyspnea scale daily in rehab to express subjective sense of shortness of breath during exertion;Long Term: Able to use Dyspnea scale to guide intensity level when exercising independently Short Term: Able to use Dyspnea scale daily in rehab to express subjective sense of shortness of breath during exertion;Long Term: Able to use Dyspnea scale to guide intensity level when exercising independently   Knowledge and understanding of Target Heart Rate Range (THRR) Yes Yes Yes Yes Yes   Intervention Provide education and explanation of THRR  including how the numbers were predicted and where they are located for reference Provide education and explanation of THRR including how the numbers were predicted and where they are located for reference Provide education and explanation of THRR including how the numbers were predicted and where they are located for reference Provide education and explanation of THRR including how the numbers were predicted and where they are located for reference Provide education and explanation of THRR including how the numbers were predicted and where they are located for reference   Expected Outcomes Short Term: Able to state/look up THRR;Long Term: Able to use THRR to govern intensity when exercising independently;Short Term: Able to use daily as guideline for intensity in rehab Short Term: Able to state/look up THRR;Long Term: Able to use THRR to  govern intensity when exercising independently;Short Term: Able to use daily as guideline for intensity in rehab Short Term: Able to state/look up THRR;Long Term: Able to use THRR to govern intensity when exercising independently;Short Term: Able to use daily as guideline for intensity in rehab Short Term: Able to state/look up THRR;Long Term: Able to use THRR to govern intensity when exercising independently;Short Term: Able to use daily as guideline for intensity in rehab Short Term: Able to state/look up THRR;Long Term: Able to use THRR to govern intensity when exercising independently;Short Term: Able to use daily as guideline for intensity in rehab   Able to check pulse independently Yes Yes Yes Yes Yes   Intervention Provide education and demonstration on how to check pulse in carotid and radial arteries.;Review the importance of being able to check your own pulse for safety during independent exercise Provide education and demonstration on how to check pulse in carotid and radial arteries.;Review the importance of being able to check your own pulse for safety during independent exercise Provide education and demonstration on how to check pulse in carotid and radial arteries.;Review the importance of being able to check your own pulse for safety during independent exercise Provide education and demonstration on how to check pulse in carotid and radial arteries.;Review the importance of being able to check your own pulse for safety during independent exercise Provide education and demonstration on how to check pulse in carotid and radial arteries.;Review the importance of being able to check your own pulse for safety during independent exercise   Expected Outcomes Short Term: Able to explain why pulse checking is important during independent exercise;Long Term: Able to check pulse independently and accurately Short Term: Able to explain why pulse checking is important during independent exercise;Long Term:  Able to check pulse independently and accurately Short Term: Able to explain why pulse checking is important during independent exercise;Long Term: Able to check pulse independently and accurately Short Term: Able to explain why pulse checking is important during independent exercise;Long Term: Able to check pulse independently and accurately Short Term: Able to explain why pulse checking is important during independent exercise;Long Term: Able to check pulse independently and accurately   Understanding of Exercise Prescription Yes Yes Yes Yes Yes   Intervention Provide education, explanation, and written materials on patient's individual exercise prescription Provide education, explanation, and written materials on patient's individual exercise prescription Provide education, explanation, and written materials on patient's individual exercise prescription Provide education, explanation, and written materials on patient's individual exercise prescription Provide education, explanation, and written materials on patient's individual exercise prescription   Expected Outcomes Short Term: Able to explain program exercise prescription;Long Term: Able to explain home exercise prescription to exercise independently  Short Term: Able to explain program exercise prescription;Long Term: Able to explain home exercise prescription to exercise independently Short Term: Able to explain program exercise prescription;Long Term: Able to explain home exercise prescription to exercise independently Short Term: Able to explain program exercise prescription;Long Term: Able to explain home exercise prescription to exercise independently Short Term: Able to explain program exercise prescription;Long Term: Able to explain home exercise prescription to exercise independently          Exercise Goals Re-Evaluation:  Exercise Goals Re-Evaluation    Row Name 12/09/20 1227 01/06/21 1213 02/03/21 1210 02/26/21 1200 03/27/21 0811      Exercise Goal Re-Evaluation   Exercise Goals Review Increase Strength and Stamina;Increase Physical Activity;Able to understand and use rate of perceived exertion (RPE) scale;Able to understand and use Dyspnea scale;Knowledge and understanding of Target Heart Rate Range (THRR);Able to check pulse independently;Understanding of Exercise Prescription Increase Strength and Stamina;Increase Physical Activity;Able to understand and use rate of perceived exertion (RPE) scale;Able to understand and use Dyspnea scale;Knowledge and understanding of Target Heart Rate Range (THRR);Able to check pulse independently;Understanding of Exercise Prescription Increase Strength and Stamina;Increase Physical Activity;Able to understand and use rate of perceived exertion (RPE) scale;Able to understand and use Dyspnea scale;Knowledge and understanding of Target Heart Rate Range (THRR);Able to check pulse independently;Understanding of Exercise Prescription Increase Strength and Stamina;Increase Physical Activity;Able to understand and use rate of perceived exertion (RPE) scale;Able to understand and use Dyspnea scale;Knowledge and understanding of Target Heart Rate Range (THRR);Able to check pulse independently;Understanding of Exercise Prescription Increase Strength and Stamina;Increase Physical Activity;Able to understand and use rate of perceived exertion (RPE) scale;Able to understand and use Dyspnea scale;Knowledge and understanding of Target Heart Rate Range (THRR);Able to check pulse independently;Understanding of Exercise Prescription   Comments Patient has completed 5 exercise sessions. She has tolerated exercise well and improving well. She has progressed on the nuStep and we discussed increasing her intensity on the resistance training next exercise session. She is getting stonger and building more stamina. She is currently exercising on the NuStep at 2.2 METs. Will continue to progress as able. Patient has completed 11  exercise sessions. She tolerates exercise well and is improving. She is building up stamina and feels that her daily life is getting easier. She is getting stronger. She is continuing to exercise at home. We discussed increasing her intensity at home. She is currently exercising at 2.7 METs on the Nustep. Will continue to progress as able. Patient has completed 16 exercise sessions. She is tolerating exercise well. Her progression has been stagnant and she hasnt progressed much since the last ITP. She does feel like rehab is helping her at home with ADLs. She is still exercising at home. She has expressed feeling more tired. She is currently exercising at 2.1 METs on the NuStep. WIll continue to monitor and progress as able. Patient has completed 22 exercise sessions. She has tolerated exercise well. She has been inconsistent with her attendance. She has progressed slowly. She has been having issues with her breathing complaining she is more short of breath than usual and stated that she had an asthma attack recently. She is currently exercising at 2.4 METs on the NuStep. Will continue to monitor and progress as able. Patient has completed 26 sessions. She has tolerated exercise well and has progressed well but slowly in the program. She has still had some issues with her shortness of breath during exercise recently. She ended exercising at 2.9 METs on the NuStep. She is  going to continue to exercise at home.   Expected Outcomes Through exercise at rehab and with a home exercise program, patient will reach their goals. Through exercise at rehab and with a home exercise program, patient will reach their goals. Through exercise at rehab and with a home exercise program, patient will reach their goals. Through exercise at rehab and with a home exercise program, patient will reach their goals. Through exercise at rehab and with a home exercise program, patient will reach their goals.          Nutrition & Weight -  Outcomes:  Pre Biometrics - 03/26/21 1119      Pre Biometrics   Weight 73.9 kg    Waist Circumference 37 inches    Hip Circumference 38 inches    Waist to Hip Ratio 0.97 %    Triceps Skinfold 30 mm    % Body Fat 42.4 %    Grip Strength 19.2 kg    Flexibility 0 in    Single Leg Stand 16 seconds            Nutrition:  Nutrition Therapy & Goals - 01/26/21 1453      Personal Nutrition Goals   Comments We provide 2 educational sessions on nutrition.Handout provided and discussed.      Intervention Plan   Intervention Nutrition handout(s) given to patient.           Nutrition Discharge:  Nutrition Assessments - 03/26/21 1121      MEDFICTS Scores   Pre Score 23    Post Score 18    Score Difference -5           Education Questionnaire Score:  Knowledge Questionnaire Score - 03/26/21 1120      Knowledge Questionnaire Score   Pre Score 15/18    Post Score 17/18          Patient graduated from Pulmonary Rehabilitation today on 03-26-21 after completing 26 sessions. They achieved LTG of 30 minutes of aerobic exercise at Max Met level of 2.9. All patients vitals are WNL. Discharge instruction has been reviewed in detail and patient stated an understanding of material given. Patient plans to exercise at home. Cardiac Rehab staff will make f/u call. Patient had no complaints of any abnormal S/S or pain on their exit visit.     Goals reviewed with patient; copy given to patient.

## 2021-06-09 ENCOUNTER — Other Ambulatory Visit: Payer: Self-pay

## 2021-06-09 ENCOUNTER — Ambulatory Visit (HOSPITAL_COMMUNITY)
Admission: RE | Admit: 2021-06-09 | Discharge: 2021-06-09 | Disposition: A | Discharge status: Home / Self Care | Payer: BC Managed Care – PPO | Source: Ambulatory Visit | Attending: Internal Medicine | Admitting: Internal Medicine

## 2021-06-09 DIAGNOSIS — R5382 Chronic fatigue, unspecified: Secondary | ICD-10-CM | POA: Diagnosis not present

## 2021-06-09 DIAGNOSIS — Z8616 Personal history of COVID-19: Secondary | ICD-10-CM | POA: Diagnosis present

## 2021-06-09 DIAGNOSIS — K8020 Calculus of gallbladder without cholecystitis without obstruction: Secondary | ICD-10-CM | POA: Insufficient documentation

## 2021-06-09 DIAGNOSIS — R0609 Other forms of dyspnea: Secondary | ICD-10-CM | POA: Insufficient documentation

## 2021-06-09 DIAGNOSIS — U099 Post covid-19 condition, unspecified: Secondary | ICD-10-CM | POA: Insufficient documentation

## 2021-06-30 ENCOUNTER — Telehealth: Payer: Self-pay | Admitting: Internal Medicine

## 2021-06-30 MED ORDER — PREDNISONE 10 MG PO TABS: ORAL_TABLET | ORAL | 0 refills | Status: AC

## 2021-06-30 NOTE — Telephone Encounter (Signed)
Dr. Chase Caller as mentioned in previous message patient is already on paxlovid after it was prescribed by PCP. So I just need to send in Prednisone taper right?

## 2021-06-30 NOTE — Telephone Encounter (Signed)
Called and spoke with patient who states that she tested positive for covid Friday and has congestion, cough, headache, fatigue, chest tightness and says she had an asthma attack night before last. Denies fevers. She states PCP prescribed Paxlovid and she started taking it Friday.   Dr. Chase Caller please advise with any other recommendations.

## 2021-06-30 NOTE — Telephone Encounter (Signed)
Yeah sorry just the paxloid.

## 2021-06-30 NOTE — Telephone Encounter (Signed)
Called and spoke with patient. She verbalized understanding of the prednisone taper. RX has been sent.   Nothing further needed at time of call.

## 2021-06-30 NOTE — Telephone Encounter (Signed)
Sorry prednisone - Please take prednisone 40 mg x1 day, then 30 mg x1 day, then 20 mg x1 day, then 10 mg x1 day, and then 5 mg x1 day and stop   Not paxlovid

## 2021-06-30 NOTE — Telephone Encounter (Signed)
Dr. Chase Caller please confirm I am sending in RX for Prednisone taper NOT paxlovid

## 2021-06-30 NOTE — Telephone Encounter (Signed)
For asthma flare up  - Please take prednisone 40 mg x1 day, then 30 mg x1 day, then 20 mg x1 day, then 10 mg x1 day, and then 5 mg x1 day and stop   For covid recommend paxlovid based on normal renal function dec 2021 but please check drug interaction as below  PAXLOVID   Paxlovid (nirmatelvir 300/Ritonavir100) - BID x 5 days - for GFR >= 60   PLEASE CHECK MED LIST for the following issues. Please check the 2 different condition related to concomitant medications in alphabetical order  If Catherine Hancock with DOB 28-Aug-1961 is on any of the following Strong CYP3A inhibtors - this patient Catherine Hancock should withold these concomitant meds so they can start paxlovid stratight away. If taking any of these:  alfuzosin, amiodarone, clozapine, colchicine, dihydroergotamine, dronedarone, ergotamine, flecainide, lovastatin, lurasidone, methylergonovine, midazolam [oral], pethidine, pimozide, propafenone, propoxyphene, quinidine, ranolazine, sildenafil simvastatin, triazolam).   If   Catherine Hancock  with dob 02-26-1961 Is on any of these other strong CYP3A inducers then starting paxlovid should be delayed and the following meds should wash out first. These are dapalutamide, carbamazepine, phenobarbital, phenytoin, rifampin, St John's wort) - let me know immediately and we should delay starting paxlovid by some days even if he stops these medication.    PLEASE INFORM Catherine Hancock  OF FOLLOWING SIDE EFFECTS  Side effects - all < 5%  - skin rash (and veyr rare a conditon called TEN) - angiomedia  - myalgia - jaundice - high bP (1%) - loss of taste  - diarrhea - 25% rebond    has a past medical history of Asthma, Asthma, Chest pain, Chronic back pain, CTS (carpal tunnel syndrome), CTS (carpal tunnel syndrome), Depression (05/28/2020), Hypothyroidism, IBS (irritable bowel syndrome), Internal hemorrhoids, Migraines, OSA on CPAP (11/13/2017), and Pulmonary nodule, right.   reports that she  has never smoked. She has never used smokeless tobacco.  Past Surgical History:  Procedure Laterality Date   BREAST EXCISIONAL BIOPSY Right    benign   breast nodule surg     right breast nodule and left lymph node removed in 1994   COLONOSCOPY  06/09/2012   Procedure: COLONOSCOPY;  Surgeon: Lafayette Dragon, MD;  Location: [facility];  Service: Endoscopy;  Laterality: N/A;   EXCISION OF BREAST BIOPSY Left    2 benign lymph nodes removed   HEMORRHOID SURGERY     2007   LYMPH NODE BIOPSY     left breast   NASAL SINUS SURGERY     1992   TONSILLECTOMY     1981   TOTAL VAGINAL HYSTERECTOMY      Allergies  Allergen Reactions   Gadolinium Derivatives Other (See Comments)    Severe pain, joints locked up   Latex Other (See Comments) and Shortness Of Breath    Asthma attack    Other Shortness Of Breath and Other (See Comments)    Severe pain, joints locked up gadolinium contrast-causes joints to lock up, severe pain Severe pain, joints locked up gadolinium contrast-causes joints to lock up, severe pain  Asthma attack   Fluticasone-Salmeterol Other (See Comments)    Blurry vision Blurry vision Blurry vision Blurry vision Blurry vision   Contrast Media [Iodinated Diagnostic Agents]     gadolinium contrast-causes joints to lock up, severe pain   Doxycycline Itching and Rash   Sulfamethoxazole-Trimethoprim Diarrhea and Nausea And Vomiting    REACTION: GI upset Other reaction(s): OTHER REACTION: GI upset  Other reaction(s): OTHER Other reaction(s): OTHER    Immunization History  Administered Date(s) Administered   Influenza Whole 07/25/2007   Influenza,inj,Quad PF,6+ Mos 08/13/2016, 09/12/2019   Influenza-Unspecified 07/29/2017, 09/02/2020   Pneumococcal Conjugate-13 10/11/2013, 10/11/2013   Pneumococcal Polysaccharide-23 06/14/2017   Tdap 05/13/2011, 08/13/2016   Zoster, Live 10/30/2012    Family History  Problem Relation Age of Onset   Colon cancer Maternal Aunt         x 4   Colon cancer Maternal Uncle    Colon cancer Maternal Grandmother    Heart disease Maternal Grandfather    Breast cancer Maternal Aunt    Ovarian cancer Maternal Aunt    Lymphoma Mother    Prostate cancer Father    COPD Father    Heart failure Father    Hypertension Father    Heart disease Paternal Uncle        x 4   Heart disease Paternal Grandfather    Mitral valve prolapse Sister      Current Outpatient Medications:    albuterol (ACCUNEB) 0.63 MG/3ML nebulizer solution, Take by nebulization 3 (three) times daily., Disp: , Rfl:    albuterol (VENTOLIN HFA) 108 (90 Base) MCG/ACT inhaler, Inhale 2 puffs into the lungs every 6 (six) hours., Disp: 8 g, Rfl: 0   ALPRAZolam (XANAX) 0.5 MG tablet, Take 0.25 mg by mouth as directed. PRN, Disp: , Rfl:    aspirin EC 81 MG tablet, Take 1 tablet (81 mg total) by mouth daily., Disp: , Rfl:    azelastine (ASTELIN) 0.1 % nasal spray, Place 2 sprays into the nose 4 (four) times daily as needed., Disp: , Rfl:    BIOTIN PO, Take 1 tablet by mouth as directed., Disp: , Rfl:    budesonide-formoterol (SYMBICORT) 160-4.5 MCG/ACT inhaler, Inhale 2 puffs into the lungs 2 (two) times daily., Disp: 1 each, Rfl: 6   budesonide-formoterol (SYMBICORT) 160-4.5 MCG/ACT inhaler, Inhale 2 puffs into the lungs 2 (two) times daily., Disp: , Rfl:    Calcium Carb-Cholecalciferol (CALCIUM 600+D3) 600-800 MG-UNIT TABS, Take 1 tablet by mouth daily., Disp: , Rfl:    Cholecalciferol (VITAMIN D) 50 MCG (2000 UT) CAPS, Take 1 capsule by mouth daily with breakfast. Takes in addition to weekly ergocalciferol, Disp: , Rfl:    Cholecalciferol (VITAMIN D3 PO), Take 25,000 Units by mouth once a week., Disp: , Rfl:    citalopram (CELEXA) 20 MG tablet, Take 20 mg by mouth daily. , Disp: , Rfl:    cyclobenzaprine (FLEXERIL) 10 MG tablet, TAKE 1 TABLET THREE TIMES A DAY AS NEEDED FOR MUSCLE SPASMS (Patient taking differently: Take 10 mg by mouth at bedtime.), Disp: 90  tablet, Rfl: 0   diclofenac (VOLTAREN) 50 MG EC tablet, Take 1 tablet by mouth 2 (two) times daily., Disp: , Rfl:    DONNATAL 16.2 MG tablet, Take 16.2 mg by mouth daily as needed (Stomach pain). , Disp: , Rfl:    EPINEPHrine 0.3 mg/0.3 mL IJ SOAJ injection, Inject 0.3 mg into the muscle as needed for anaphylaxis. , Disp: , Rfl:    esomeprazole (NEXIUM) 40 MG capsule, Take 40 mg by mouth 2 (two) times daily before a meal. , Disp: , Rfl:    HYDROcodone-acetaminophen (NORCO/VICODIN) 5-325 MG tablet, Take 1 tablet by mouth every 6 (six) hours as needed for moderate pain., Disp: , Rfl:    ibuprofen (ADVIL,MOTRIN) 800 MG tablet, Take 800 mg by mouth every 8 (eight) hours as needed (pain)., Disp: , Rfl:  levocetirizine (XYZAL) 5 MG tablet, Take 1 tablet by mouth daily., Disp: , Rfl:    levothyroxine (SYNTHROID) 50 MCG tablet, Take 50 mcg by mouth daily. , Disp: , Rfl:    lubiprostone (AMITIZA) 24 MCG capsule, Take 1 capsule by mouth 2 (two) times daily., Disp: , Rfl:    meclizine (ANTIVERT) 25 MG tablet, Take 1 tablet by mouth 3 (three) times daily as needed., Disp: , Rfl:    montelukast (SINGULAIR) 10 MG tablet, Take 10 mg by mouth at bedtime., Disp: , Rfl:    nitroGLYCERIN (NITROSTAT) 0.4 MG SL tablet, Place 1 tablet (0.4 mg total) under the tongue every 5 (five) minutes as needed for chest pain., Disp: 25 tablet, Rfl: 12   polyethylene glycol powder (GLYCOLAX/MIRALAX) powder, Take 17 g by mouth as needed for mild constipation. , Disp: , Rfl:    predniSONE (DELTASONE) 10 MG tablet, 4 x 2 days, 3 x 2 days, 2 x 2 days, 1 x 2 days, then stop, Disp: 20 tablet, Rfl: 0   Prenatal Vit-Fe Fumarate-FA (M-NATAL PLUS) 27-1 MG TABS, Take 1 tablet by mouth at bedtime., Disp: , Rfl:    topiramate (TOPAMAX) 50 MG tablet, Take 50-100 mg by mouth See admin instructions. '50mg'$  in the morning and '100mg'$  by mouth at night, Disp: , Rfl:    TRULANCE 3 MG TABS, Take 1 tablet by mouth daily., Disp: , Rfl:    Vitamin D,  Ergocalciferol, (DRISDOL) 1.25 MG (50000 UNIT) CAPS capsule, Take 50,000 Units by mouth once a week., Disp: , Rfl:    zinc gluconate 50 MG tablet, Take 50 mg by mouth daily., Disp: , Rfl:    zolpidem (AMBIEN) 10 MG tablet, Take 0.5 tablets by mouth at bedtime as needed for sleep. , Disp: , Rfl:

## 2021-07-03 ENCOUNTER — Ambulatory Visit: Payer: BC Managed Care – PPO | Admitting: Internal Medicine

## 2021-07-31 ENCOUNTER — Other Ambulatory Visit: Payer: Self-pay

## 2021-07-31 ENCOUNTER — Ambulatory Visit (INDEPENDENT_AMBULATORY_CARE_PROVIDER_SITE_OTHER): Payer: BC Managed Care – PPO | Admitting: Internal Medicine

## 2021-07-31 VITALS — BP 122/74 | HR 76 | Temp 98.7°F | Ht 60.0 in | Wt 163.2 lb

## 2021-07-31 DIAGNOSIS — R0609 Other forms of dyspnea: Secondary | ICD-10-CM

## 2021-07-31 DIAGNOSIS — G4733 Obstructive sleep apnea (adult) (pediatric): Secondary | ICD-10-CM | POA: Diagnosis not present

## 2021-07-31 DIAGNOSIS — Z9989 Dependence on other enabling machines and devices: Secondary | ICD-10-CM

## 2021-07-31 DIAGNOSIS — R062 Wheezing: Secondary | ICD-10-CM | POA: Diagnosis not present

## 2021-07-31 DIAGNOSIS — J4540 Moderate persistent asthma, uncomplicated: Secondary | ICD-10-CM | POA: Diagnosis not present

## 2021-07-31 DIAGNOSIS — U099 Post covid-19 condition, unspecified: Secondary | ICD-10-CM

## 2021-07-31 DIAGNOSIS — Z23 Encounter for immunization: Secondary | ICD-10-CM

## 2021-07-31 NOTE — Progress Notes (Signed)
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 Disease  Program  [facility] [place], [place], [address]  Pager: [phone], If no answer or between  15:00h - 7:00h: call [phone] Telephone: [phone]  4:19 PM 07/31/2021

## 2021-07-31 NOTE — Patient Instructions (Addendum)
ICD-10-CM   1. Moderate persistent asthma without complication  Z20.80     2. COVID-19 long hauler manifesting chronic dyspnea  R06.09    U09.9     3. OSA on CPAP  G47.33    Z99.89     4. Other form of dyspnea  R06.09     5. 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 [first documented on pulmonary stress test April 2021] and ongoing asthma as additional reasons for current shortness of breath and cough  -Noted 3 prednisone courses in 2022 including April 2022, July 2022 and August 2022 for asthma flareup  Topiramate is also known because of shortness of breath    Plan -Flu shot today -Respect lack of desire to have COVID-vaccine but at this point you  probably have a high degree of natural immunity  - Do CBC with differential, chemistry, blood IgE -If topiramate is not the cause then we will consider an asthma biologic  -tepelzeumab if blood IgE and eosinophils are normal -Continue Symbicort and Singulair as before -Continue 2 L nasal cannula with CPAP at night  Follow-up - Return to see nurse practitioner in the next few to several weeks to discuss results and decide the next step

## 2021-08-03 LAB — CBC WITH DIFFERENTIAL/PLATELET
Absolute Monocytes: 511 cells/uL (ref 200–950)
Basophils Absolute: 97 cells/uL (ref 0–200)
Basophils Relative: 1.4 %
Eosinophils Absolute: 283 cells/uL (ref 15–500)
Eosinophils Relative: 4.1 %
HCT: 34.3 % — ABNORMAL LOW (ref 35.0–45.0)
Hemoglobin: 12.2 g/dL (ref 11.7–15.5)
Lymphs Abs: 2318 cells/uL (ref 850–3900)
MCH: 33 pg (ref 27.0–33.0)
MCHC: 35.6 g/dL (ref 32.0–36.0)
MCV: 92.7 fL (ref 80.0–100.0)
MPV: 10.3 fL (ref 7.5–12.5)
Monocytes Relative: 7.4 %
Neutro Abs: 3692 cells/uL (ref 1500–7800)
Neutrophils Relative %: 53.5 %
Platelets: 234 10*3/uL (ref 140–400)
RBC: 3.7 10*6/uL — ABNORMAL LOW (ref 3.80–5.10)
RDW: 12.4 % (ref 11.0–15.0)
Total Lymphocyte: 33.6 %
WBC: 6.9 10*3/uL (ref 3.8–10.8)

## 2021-08-03 LAB — BASIC METABOLIC PANEL
BUN: 12 mg/dL (ref 7–25)
CO2: 21 mmol/L (ref 20–32)
Calcium: 9.2 mg/dL (ref 8.6–10.4)
Chloride: 112 mmol/L — ABNORMAL HIGH (ref 98–110)
Creat: 0.77 mg/dL (ref 0.50–1.05)
Glucose, Bld: 89 mg/dL (ref 65–99)
Potassium: 3.8 mmol/L (ref 3.5–5.3)
Sodium: 143 mmol/L (ref 135–146)

## 2021-08-03 LAB — IGE: IgE (Immunoglobulin E), Serum: 14 kU/L (ref <=114)

## 2021-09-01 ENCOUNTER — Ambulatory Visit: Payer: BC Managed Care – PPO | Admitting: Adult Health

## 2021-09-01 ENCOUNTER — Ambulatory Visit (INDEPENDENT_AMBULATORY_CARE_PROVIDER_SITE_OTHER): Payer: BC Managed Care – PPO | Admitting: Adult Health

## 2021-09-01 ENCOUNTER — Other Ambulatory Visit: Payer: Self-pay

## 2021-09-01 ENCOUNTER — Encounter: Payer: Self-pay | Admitting: Adult Health

## 2021-09-01 DIAGNOSIS — J4550 Severe persistent asthma, uncomplicated: Secondary | ICD-10-CM

## 2021-09-01 MED ORDER — SPIRIVA RESPIMAT 2.5 MCG/ACT IN AERS: 2.0000 | INHALATION_SPRAY | Freq: Every day | RESPIRATORY_TRACT | 5 refills | Status: DC

## 2021-09-01 MED ORDER — SPIRIVA RESPIMAT 1.25 MCG/ACT IN AERS: 2.0000 | INHALATION_SPRAY | Freq: Every day | RESPIRATORY_TRACT | 0 refills | Status: DC

## 2021-09-01 NOTE — Assessment & Plan Note (Signed)
Severe persistent asthma with moderate symptom burden. Patient is eosinophils are elevated.  Would be a good candidate for biologic therapy such as Dupixent. She wants to hold off on Biologics at this time.  We will add in Spiriva in addition to her Symbicort for triple maintenance inhaler. Continue on trigger prevention.  Plan  Patient Instructions  Add Spiriva 2 puffs daily  Continue on Symbicort 2 puffs Twice daily  , rinse after use  Continue on Singulair and Xyzal daily  Albuterol inhaler and neb As needed   Asthma action plan  Dupixent injection is the asthma biologic that we discussed  Follow up with Dr. Chase Caller in 6-8 weeks and As needed

## 2021-09-01 NOTE — Progress Notes (Signed)
@Patient  ID: Catherine Hancock, female    DOB: 12/29/1960, 60 y.o.   MRN: [id number]  Chief Complaint  Patient presents with   Follow-up    New medicine for asthma.    Referring provider: Tobe Sos, MD  HPI: 60 year old female followed for moderate persistent asthma, COVID long-haul her with chronic shortness of breath, exercise-induced asthma COVID-19 infection summer 2021 and August 2022  TEST/EVENTS :  High-resolution CT chest June 09, 2021 showed redemonstrated irregular bandlike peripheral interstitial opacity with significant subpleural sparing particular the lung base.  Overall appearance is improved compared to October 2021 with complete resolution of previous groundglass.  Findings are suggestive of sequela of previous COVID airspace disease.  PFTs September 19, 2020 with FEV1 at 99%, ratio 87, FVC 89%, no significant bronchodilator response, Decreased DLCO 79%.  09/01/2021 Follow up : Asthma  Patient returns for 1 month follow-up.  Patient was seen last visit with increased asthmatic symptoms and frequent asthma exacerbations requiring several courses of antibiotics over the last 6 months. She was recommended to decrease Topamax to daily to see if side effects of medication were contributing .  She remains on Symbicort twice daily, Singulair daily.  Labs last visit showed IgE at 14.  Absolute eosinophils 283. Uses Albuterol on average twice daily   Has GERD on nexium, well controlled  Take Xyxzal most days.  Since last visit doing slightly better and trying to be more active.  We discussed the addition of biologic therapy such as Dupixent.  Patient wants to think about it and look up information regarding biologic therapy for asthma. Started to go to gym [employer] 5 days a week for 2 hrs      Allergies  Allergen Reactions   Gadolinium Derivatives Other (See Comments)    Severe pain, joints locked up   Latex Other (See Comments) and Shortness Of Breath     Asthma attack    Other Shortness Of Breath and Other (See Comments)    Severe pain, joints locked up gadolinium contrast-causes joints to lock up, severe pain Severe pain, joints locked up gadolinium contrast-causes joints to lock up, severe pain  Asthma attack   Fluticasone-Salmeterol Other (See Comments)    Blurry vision Blurry vision Blurry vision Blurry vision Blurry vision   Contrast Media [Iodinated Diagnostic Agents]     gadolinium contrast-causes joints to lock up, severe pain   Doxycycline Itching and Rash   Sulfamethoxazole-Trimethoprim Diarrhea and Nausea And Vomiting    REACTION: GI upset Other reaction(s): OTHER REACTION: GI upset Other reaction(s): OTHER Other reaction(s): OTHER    Immunization History  Administered Date(s) Administered   Influenza Whole 07/25/2007   Influenza,inj,Quad PF,6+ Mos 08/13/2016, 09/12/2019, 07/31/2021   Influenza-Unspecified 07/29/2017, 09/02/2020   Pneumococcal Conjugate-13 10/11/2013, 10/11/2013   Pneumococcal Polysaccharide-23 06/14/2017   Tdap 05/13/2011, 08/13/2016, 02/06/2021   Zoster Recombinat (Shingrix) 02/06/2021   Zoster, Live 10/30/2012, 04/27/2021    Past Medical History:  Diagnosis Date   Asthma    Asthma    Chest pain    LHC 7/08: EF 60%, normal coronaries; ETT-Echo 6/11: EF 55-60%, no ischemia   Chronic back pain    CTS (carpal tunnel syndrome)    bil   CTS (carpal tunnel syndrome)    Depression 05/28/2020   Hypothyroidism    IBS (irritable bowel syndrome)    Internal hemorrhoids    Migraines    OSA on CPAP 11/13/2017   Mild with AHI 13/hr    Pulmonary nodule, right  Tobacco History: Social History   Tobacco Use  Smoking Status Never  Smokeless Tobacco Never   Counseling given: Not Answered   Outpatient Medications Prior to Visit  Medication Sig Dispense Refill   albuterol (ACCUNEB) 0.63 MG/3ML nebulizer solution Take by nebulization 3 (three) times daily.     albuterol (VENTOLIN HFA)  108 (90 Base) MCG/ACT inhaler Inhale 2 puffs into the lungs every 6 (six) hours. 8 g 0   ALPRAZolam (XANAX) 0.5 MG tablet Take 0.25 mg by mouth as directed. PRN     aspirin EC 81 MG tablet Take 1 tablet (81 mg total) by mouth daily.     BIOTIN PO Take 1 tablet by mouth as directed.     budesonide-formoterol (SYMBICORT) 160-4.5 MCG/ACT inhaler Inhale 2 puffs into the lungs 2 (two) times daily.     Calcium Carb-Cholecalciferol (CALCIUM 600+D3) 600-800 MG-UNIT TABS Take 1 tablet by mouth daily.     Cholecalciferol (VITAMIN D) 50 MCG (2000 UT) CAPS Take 1 capsule by mouth daily with breakfast. Takes in addition to weekly ergocalciferol     citalopram (CELEXA) 20 MG tablet Take 20 mg by mouth daily.      cyclobenzaprine (FLEXERIL) 10 MG tablet TAKE 1 TABLET THREE TIMES A DAY AS NEEDED FOR MUSCLE SPASMS (Patient taking differently: Take 10 mg by mouth at bedtime.) 90 tablet 0   diclofenac (VOLTAREN) 50 MG EC tablet Take 1 tablet by mouth 2 (two) times daily.     DONNATAL 16.2 MG tablet Take 16.2 mg by mouth daily as needed (Stomach pain).      EPINEPHrine 0.3 mg/0.3 mL IJ SOAJ injection Inject 0.3 mg into the muscle as needed for anaphylaxis.      HYDROcodone-acetaminophen (NORCO/VICODIN) 5-325 MG tablet Take 1 tablet by mouth every 6 (six) hours as needed for moderate pain.     ibuprofen (ADVIL,MOTRIN) 800 MG tablet Take 800 mg by mouth every 8 (eight) hours as needed (pain).     levocetirizine (XYZAL) 5 MG tablet Take 1 tablet by mouth daily.     levothyroxine (SYNTHROID) 50 MCG tablet Take 75 mcg by mouth daily.     lubiprostone (AMITIZA) 24 MCG capsule Take 1 capsule by mouth 2 (two) times daily.     montelukast (SINGULAIR) 10 MG tablet Take 10 mg by mouth at bedtime.     polyethylene glycol powder (GLYCOLAX/MIRALAX) powder Take 17 g by mouth as needed for mild constipation.      Prenatal Vit-Fe Fumarate-FA (M-NATAL PLUS) 27-1 MG TABS Take 1 tablet by mouth at bedtime.     topiramate (TOPAMAX) 50  MG tablet Take 50-100 mg by mouth See admin instructions. 100mg  by mouth at night     Vitamin D, Ergocalciferol, (DRISDOL) 1.25 MG (50000 UNIT) CAPS capsule Take 50,000 Units by mouth once a week.     zinc gluconate 50 MG tablet Take 50 mg by mouth daily.     zolpidem (AMBIEN) 10 MG tablet Take 0.5 tablets by mouth at bedtime as needed for sleep.      esomeprazole (NEXIUM) 40 MG capsule Take 40 mg by mouth 2 (two) times daily before a meal.      nitroGLYCERIN (NITROSTAT) 0.4 MG SL tablet Place 1 tablet (0.4 mg total) under the tongue every 5 (five) minutes as needed for chest pain. 25 tablet 12   No facility-administered medications prior to visit.     Review of Systems:   Constitutional:   No  weight loss, night sweats,  Fevers,  chills,  +fatigue, or  lassitude.  HEENT:   No headaches,  Difficulty swallowing,  Tooth/dental problems, or  Sore throat,                No sneezing, itching, ear ache,  +nasal congestion, post nasal drip,   CV:  No chest pain,  Orthopnea, PND, swelling in lower extremities, anasarca, dizziness, palpitations, syncope.   GI  No heartburn, indigestion, abdominal pain, nausea, vomiting, diarrhea, change in bowel habits, loss of appetite, bloody stools.   Resp: .  No chest wall deformity  Skin: no rash or lesions.  GU: no dysuria, change in color of urine, no urgency or frequency.  No flank pain, no hematuria   MS:  No joint pain or swelling.  No decreased range of motion.  No back pain.    Physical Exam  BP 118/80 (BP Location: Left Arm, Cuff Size: Normal)   Pulse 95   Ht 5' (1.524 m)   Wt 162 lb 6.4 oz (73.7 kg)   SpO2 99%   BMI 31.72 kg/m   GEN: A/Ox3; pleasant , NAD, well nourished    HEENT:  [place]/AT,   NOSE-clear, THROAT-clear, no lesions, no postnasal drip or exudate noted.   NECK:  Supple w/ fair ROM; no JVD; normal carotid impulses w/o bruits; no thyromegaly or nodules palpated; no lymphadenopathy.    RESP  Clear  P & A; w/o, wheezes/  rales/ or rhonchi. no accessory muscle use, no dullness to percussion  CARD:  RRR, no m/r/g, no peripheral edema, pulses intact, no cyanosis or clubbing.  GI:   Soft & nt; nml bowel sounds; no organomegaly or masses detected.   Musco: Warm bil, no deformities or joint swelling noted.   Neuro: alert, no focal deficits noted.    Skin: Warm, no lesions or rashes    Lab Results:  CBC    Component Value Date/Time   WBC 6.9 07/31/2021 1625   RBC 3.70 (L) 07/31/2021 1625   HGB 12.2 07/31/2021 1625   HCT 34.3 (L) 07/31/2021 1625   PLT 234 07/31/2021 1625   MCV 92.7 07/31/2021 1625   MCH 33.0 07/31/2021 1625   MCHC 35.6 07/31/2021 1625   RDW 12.4 07/31/2021 1625   LYMPHSABS 2,318 07/31/2021 1625   MONOABS 0.2 06/02/2020 0352   EOSABS 283 07/31/2021 1625   BASOSABS 97 07/31/2021 1625    BMET    Component Value Date/Time   NA 143 07/31/2021 1625   NA 141 10/01/2020 1136   K 3.8 07/31/2021 1625   CL 112 (H) 07/31/2021 1625   CO2 21 07/31/2021 1625   GLUCOSE 89 07/31/2021 1625   BUN 12 07/31/2021 1625   BUN 12 10/01/2020 1136   CREATININE 0.77 07/31/2021 1625   CALCIUM 9.2 07/31/2021 1625   GFRNONAA 100 10/01/2020 1136   GFRAA 115 10/01/2020 1136    BNP No results found for: BNP  ProBNP No results found for: PROBNP  Imaging: No results found.    PFT Results Latest Ref Rng & Units 09/19/2020  FVC-Pre L 2.64  FVC-Predicted Pre % 92  FVC-Post L 2.56  FVC-Predicted Post % 89  Pre FEV1/FVC % % 82  Post FEV1/FCV % % 87  FEV1-Pre L 2.16  FEV1-Predicted Pre % 97  FEV1-Post L 2.21  DLCO uncorrected ml/min/mmHg 14.02  DLCO UNC% % 79  DLCO corrected ml/min/mmHg 14.02  DLCO COR %Predicted % 79  DLVA Predicted % 83  TLC L 3.28  TLC % Predicted %  73  RV % Predicted % 36    No results found for: NITRICOXIDE      Assessment & Plan:   Asthma Severe persistent asthma with moderate symptom burden. Patient is eosinophils are elevated.  Would be a good  candidate for biologic therapy such as Dupixent. She wants to hold off on Biologics at this time.  We will add in Spiriva in addition to her Symbicort for triple maintenance inhaler. Continue on trigger prevention.  Plan  Patient Instructions  Add Spiriva 2 puffs daily  Continue on Symbicort 2 puffs Twice daily  , rinse after use  Continue on Singulair and Xyzal daily  Albuterol inhaler and neb As needed   Asthma action plan  Dupixent injection is the asthma biologic that we discussed  Follow up with Dr. Chase Caller in 6-8 weeks and As needed         I spent   31 minutes dedicated to the care of this patient on the date of this encounter to include pre-visit review of records, face-to-face time with the patient discussing conditions above, post visit ordering of testing, clinical documentation with the electronic health record, making appropriate referrals as documented, and communicating necessary findings to members of the patients care team.    Rexene Edison, NP 09/01/2021

## 2021-09-01 NOTE — Patient Instructions (Addendum)
Add Spiriva 2 puffs daily  Continue on Symbicort 2 puffs Twice daily  , rinse after use  Continue on Singulair and Xyzal daily  Albuterol inhaler and neb As needed   Asthma action plan  Dupixent injection is the asthma biologic that we discussed  Follow up with Dr. Chase Caller in 6-8 weeks and As needed

## 2021-10-09 ENCOUNTER — Other Ambulatory Visit: Payer: Self-pay | Admitting: Obstetrics and Gynecology

## 2021-10-09 DIAGNOSIS — N644 Mastodynia: Secondary | ICD-10-CM

## 2021-10-09 DIAGNOSIS — N6324 Unspecified lump in the left breast, lower inner quadrant: Secondary | ICD-10-CM

## 2021-10-13 ENCOUNTER — Ambulatory Visit: Payer: BC Managed Care – PPO | Admitting: Internal Medicine

## 2021-10-20 ENCOUNTER — Ambulatory Visit
Admission: RE | Admit: 2021-10-20 | Discharge: 2021-10-20 | Disposition: A | Discharge status: Home / Self Care | Payer: BC Managed Care – PPO | Source: Ambulatory Visit | Attending: Obstetrics and Gynecology | Admitting: Obstetrics and Gynecology

## 2021-10-20 ENCOUNTER — Other Ambulatory Visit: Payer: BC Managed Care – PPO

## 2021-10-20 DIAGNOSIS — N644 Mastodynia: Secondary | ICD-10-CM

## 2021-10-20 DIAGNOSIS — N6324 Unspecified lump in the left breast, lower inner quadrant: Secondary | ICD-10-CM

## 2021-11-01 HISTORY — PX: EYE SURGERY: SHX253

## 2021-11-10 ENCOUNTER — Other Ambulatory Visit: Payer: BC Managed Care – PPO

## 2021-12-10 NOTE — Progress Notes (Signed)
Cardiology Office Note:    Date:  12/11/2021   ID:  Catherine Hancock, DOB 27-Oct-1961, MRN [id number]  PCP:  Tobe Sos, MD  Cardiologist:  Buford Dresser, MD PhD (previously Dr. Saunders Revel)  Referring MD: Tobe Sos, MD   CC: Follow up  History of Present Illness:    Catherine Hancock is a 61 y.o. female with a hx of atypical chest pain, irritable bowel syndrome, hypothyroidism, OSA on CPAP who is seen for follow up today. She had Covid 19 in 05/2020.  CV history: Got Covid in July 2022, in the hospital for 9 days, received remdesivir and steroids. Had continuous chest pain and shortness of breath since then. CT showed Covid inflammatory changes. Uses O2 at home at night with her CPAP. Followed by pulmonology. Has chest tightness that improves with use of her inhaler. This is different than her typical chest pain, which is unrelated to exertion, completely random, sometimes triggered by cough.   Also had pain that last all day that is her typical chest pain. Nonexertional, can last all day/multiple days in a row. Better with lying down, no other clear aggravating/alleviating factors. It had been more severe on occasion, sometimes a 6/10, worse than it was pre Covid. Worried that it is constant.   Today: She is accompanied by her husband. Overall, she is feeling okay. She has been suffering from chest pain occurring about once a week. This occurs randomly, with no known worsening or alleviating factors.  She also endorses sudden weakness spells, associated with elevated heart rates and blood pressures.  Occasionally she feels lightheaded and near-syncopal. She usually tries to sit down and position herself in front of a fan. No full syncopal episodes.   For exercise she often works out at [employer].  Since her COVID infection, her LLE is regularly bothersome for her. Lately she has not had issues with edema.   She denies any palpitations, or shortness of breath. No headaches,  orthopnea, or PND.   Past Medical History:  Diagnosis Date   Asthma    Asthma    Chest pain    LHC 7/08: EF 60%, normal coronaries; ETT-Echo 6/11: EF 55-60%, no ischemia   Chronic back pain    CTS (carpal tunnel syndrome)    bil   CTS (carpal tunnel syndrome)    Depression 05/28/2020   Hypothyroidism    IBS (irritable bowel syndrome)    Internal hemorrhoids    Migraines    OSA on CPAP 11/13/2017   Mild with AHI 13/hr    Pulmonary nodule, right     Past Surgical History:  Procedure Laterality Date   BREAST EXCISIONAL BIOPSY Right    benign   breast nodule surg     right breast nodule and left lymph node removed in 1994   COLONOSCOPY  06/09/2012   Procedure: COLONOSCOPY;  Surgeon: Lafayette Dragon, MD;  Location: [facility];  Service: Endoscopy;  Laterality: N/A;   EXCISION OF BREAST BIOPSY Left    2 benign lymph nodes removed   HEMORRHOID SURGERY     2007   LYMPH NODE BIOPSY     left breast   NASAL SINUS SURGERY     1992   TONSILLECTOMY     1981   TOTAL VAGINAL HYSTERECTOMY      Current Medications: Current Outpatient Medications on File Prior to Visit  Medication Sig   albuterol (ACCUNEB) 0.63 MG/3ML nebulizer solution Take by nebulization 3 (three) times daily.  albuterol (VENTOLIN HFA) 108 (90 Base) MCG/ACT inhaler Inhale 2 puffs into the lungs every 6 (six) hours.   ALPRAZolam (XANAX) 0.5 MG tablet Take 0.25 mg by mouth as directed. PRN   aspirin EC 81 MG tablet Take 1 tablet (81 mg total) by mouth daily.   BIOTIN PO Take 1 tablet by mouth as directed.   budesonide-formoterol (SYMBICORT) 160-4.5 MCG/ACT inhaler Inhale 2 puffs into the lungs 2 (two) times daily.   Calcium Carb-Cholecalciferol (CALCIUM 600+D3) 600-800 MG-UNIT TABS Take 1 tablet by mouth daily.   Cholecalciferol (VITAMIN D) 50 MCG (2000 UT) CAPS Take 1 capsule by mouth daily with breakfast. Takes in addition to weekly ergocalciferol   citalopram (CELEXA) 20 MG tablet Take 20 mg by mouth daily.     cyclobenzaprine (FLEXERIL) 10 MG tablet TAKE 1 TABLET THREE TIMES A DAY AS NEEDED FOR MUSCLE SPASMS (Patient taking differently: Take 10 mg by mouth at bedtime.)   diclofenac (VOLTAREN) 50 MG EC tablet Take 1 tablet by mouth 2 (two) times daily.   DONNATAL 16.2 MG tablet Take 16.2 mg by mouth daily as needed (Stomach pain).    EPINEPHrine 0.3 mg/0.3 mL IJ SOAJ injection Inject 0.3 mg into the muscle as needed for anaphylaxis.    HYDROcodone-acetaminophen (NORCO/VICODIN) 5-325 MG tablet Take 1 tablet by mouth every 6 (six) hours as needed for moderate pain.   ibuprofen (ADVIL,MOTRIN) 800 MG tablet Take 800 mg by mouth every 8 (eight) hours as needed (pain).   levocetirizine (XYZAL) 5 MG tablet Take 1 tablet by mouth daily.   levothyroxine (SYNTHROID) 50 MCG tablet Take 75 mcg by mouth daily.   lubiprostone (AMITIZA) 24 MCG capsule Take 1 capsule by mouth 2 (two) times daily.   montelukast (SINGULAIR) 10 MG tablet Take 10 mg by mouth at bedtime.   polyethylene glycol powder (GLYCOLAX/MIRALAX) powder Take 17 g by mouth as needed for mild constipation.    Prenatal Vit-Fe Fumarate-FA (M-NATAL PLUS) 27-1 MG TABS Take 1 tablet by mouth at bedtime.   Tiotropium Bromide Monohydrate (SPIRIVA RESPIMAT) 1.25 MCG/ACT AERS Inhale 2 puffs into the lungs daily.   Tiotropium Bromide Monohydrate (SPIRIVA RESPIMAT) 2.5 MCG/ACT AERS Inhale 2 puffs into the lungs daily.   topiramate (TOPAMAX) 50 MG tablet Take 50-100 mg by mouth See admin instructions. 100mg  by mouth at night   Vitamin D, Ergocalciferol, (DRISDOL) 1.25 MG (50000 UNIT) CAPS capsule Take 50,000 Units by mouth once a week.   zinc gluconate 50 MG tablet Take 50 mg by mouth daily.   zolpidem (AMBIEN) 10 MG tablet Take 0.5 tablets by mouth at bedtime as needed for sleep.    esomeprazole (NEXIUM) 40 MG capsule Take 40 mg by mouth 2 (two) times daily before a meal.    nitroGLYCERIN (NITROSTAT) 0.4 MG SL tablet Place 1 tablet (0.4 mg total) under the tongue  every 5 (five) minutes as needed for chest pain.   No current facility-administered medications on file prior to visit.     Allergies:   Gadolinium derivatives, Latex, Other, Fluticasone-salmeterol, Contrast media [iodinated contrast media], Doxycycline, and Sulfamethoxazole-trimethoprim   Social History   Tobacco Use   Smoking status: Never   Smokeless tobacco: Never  Vaping Use   Vaping Use: Never used  Substance Use Topics   Alcohol use: No   Drug use: No    Family History: The patient's family history includes Breast cancer in her maternal aunt; COPD in her father; Colon cancer in her maternal aunt, maternal grandmother, and maternal  uncle; Heart disease in her maternal grandfather, paternal grandfather, and paternal uncle; Heart failure in her father; Hypertension in her father; Lymphoma in her mother; Mitral valve prolapse in her sister; Ovarian cancer in her maternal aunt; Prostate cancer in her father.  ROS:   Please see the history of present illness.   (+) Chest pain (+) Generalized weakness spells (+) Rapid heart rates (+) Lightheadedness (+) Near-syncope Additional pertinent ROS negative except as documented.  EKGs/Labs/Other Studies Reviewed:    The following studies were reviewed today:  CT Chest 06/09/2021: FINDINGS: Cardiovascular: No significant vascular findings. Normal heart size. No pericardial effusion.   Mediastinum/Nodes: No enlarged mediastinal, hilar, or axillary lymph nodes. Thyroid gland, trachea, and esophagus demonstrate no significant findings.   Lungs/Pleura: There is redemonstrated irregular, generally bandlike peripheral interstitial opacity, with significant subpleural sparing, particularly the lung bases (series 7, image 191). Overall appearance is improved compared to prior examination dated 08/01/2020, with complete resolution of previously seen ground-glass associated with these findings. No significant air trapping on expiratory  phase imaging. No pleural effusion or pneumothorax.   Upper Abdomen: No acute abnormality. Small gallstone in the dependent gallbladder (series 4, image 135).   Musculoskeletal: No chest wall mass or suspicious bone lesions identified.   IMPRESSION: 1. There is redemonstrated irregular, generally bandlike peripheral interstitial opacity, with significant subpleural sparing, particularly at the lung bases. Overall appearance is improved compared to prior examination dated 08/01/2020, with complete resolution of previously seen ground-glass associated with these findings. This chronic organizing pneumonia appearance is particularly suggestive of sequelae of prior COVID airspace disease. 2. No significant air trapping on expiratory phase imaging. 3. Cholelithiasis.  Cardiac CT/Calcium score 10/20/2020: Aorta:  Normal size.  No calcifications.  No dissection.   Aortic Valve:  Trileaflet.  No calcifications.   Coronary Arteries:  Normal coronary origin.  Left dominance.   Left main is a large artery that gives rise to LAD and LCX arteries. Left main is a short artery with no plaque.   LAD is a large vessel that gives rise to one diagonal artery and has no plaque.   LCX is a large dominant artery that gives rise to one large OM1 branch, PLA and PDA. There are only minimal irregularities.   RCA is a small non-dominant artery that has no plaque.   Other findings:   Normal pulmonary vein drainage into the left atrium.   Normal left atrial appendage without a thrombus.   Normal size of the pulmonary artery.   IMPRESSION: 1. Coronary calcium score of 0. This was 0 percentile for age and sex matched control.   2. Normal coronary origin with left dominance.   3. CAD-RADS 0. No evidence of CAD (0%) with only minimal irregularities. Consider non-atherosclerotic causes of chest pain.   Bilateral LE Doppler 06/04/2020: Summary:  RIGHT:  - There is no evidence of deep vein  thrombosis in the lower extremity.     - No cystic structure found in the popliteal fossa.     LEFT:  - There is no evidence of deep vein thrombosis in the lower extremity.     - No cystic structure found in the popliteal fossa.  Cardiopulmonary Exercise Test 02/18/2020: Interpretation   Notes: Patient gave a very good effort. Pulse-oximetry remained 97-99% for the duration of exercise. Exercise began with stage 2 of [place] with further modifications that can be found in the time-down data attached.  Patient's Pro- Air and Symbicort were administered 15 mins after exercise.  ECG:  Resting ECG in normal sinus rhythm. HR response appropriate. There were no sustained arrhythmias or ST-T changes. BP response appropriate.   PFT:  Pre-exercise spirometry was within normal limits. The MVV was normal. Post-exercise spirometry demonstrates significantly reduced FEV1 from resting.   CPX:  Exercise Capacity- Exercise testing with gas exchange demonstrates a normal peak VO2 of 20.5 ml/kg/min (96% of the age/gender/weight matched sedentary norms). The RER of 1.05 indicates a near maximal effort. When adjusted to the patient's ideal body weight of 124 lb (56.3 kg) the peak VO2 is 25.5 ml/kg (ibw)/min (102% of the ibw-adjusted predicted).   Cardiovascular response- The O2pulse (a surrogate for stroke volume) increased with incremental exercise reaching peak at 11 ml/beat (122% predicted).   Ventilatory response- The VE/VCO2 slope is mildly elevated and indicates Increased dead space ventilation. The oxygen uptake efficiency slope (OUES) is normal. The VO2 at the ventilatory threshold was normal at 78% of the predicted peak VO2. At peak exercise, the ventilation reached 75% of the measured MVV and breathing reserve was 18 indicating ventilatory reserve was depleting.  PETCO2 was normal at32 mmHg during exercise.   Conclusion: Exercise testing with gas exchange demonstrates normal functional capacity  when compared to matched sedentary norms. There is no indication for cardiopulmonary limitation. 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   Echo 01/07/2020:  1. Left ventricular ejection fraction, by estimation, is 60 to 65%. The  left ventricle has normal function. The left ventricle has no regional  wall motion abnormalities. Left ventricular diastolic parameters were  normal.   2. Right ventricular systolic function is normal. The right ventricular  size is normal.   3. The mitral valve is normal in structure. No evidence of mitral valve  regurgitation. No evidence of mitral stenosis.   4. The aortic valve is tricuspid. Aortic valve regurgitation is not  visualized. No aortic stenosis is present.   5. The inferior vena cava is normal in size with greater than 50%  respiratory variability, suggesting right atrial pressure of 3 mmHg.   Comparison(s): No prior Echocardiogram. Stress echo 04/04/17 EF 55%.   Conclusion(s)/Recommendation(s): Normal biventricular function without  evidence of hemodynamically significant valvular heart disease.   Monitor 09/2019: ~12.5 days of data recorded on Zio monitor. Patient had a min HR of 53 bpm, max HR of 167 bpm, and avg HR of 76 bpm. Predominant underlying rhythm was Sinus Rhythm. No VT, atrial fibrillation, high degree block, or pauses noted. Isolated atrial and ventricular ectopy was rare (<1%). There were 8 triggered events, which were either normal sinus rhythm or sinus tachycardia. There were 4 labeled SVT events, but on review some were either isolated PACs or sinus tachycardia. There were two events that are likely SVT, though cannot be fully evaluated. The possible SVT run with the fastest interval lasted 15 beats with a max rate of 167 bpm, and the longest lasting was 13 beats with an avg rate of 132 bpm. These did occur within  45 seconds of symptoms.  Cath/PCI: LHC (02/01/07): LMCA normal. LAD with 2 large diagonal branches, normal. Dominant LCx without significant disease. Nondominant RCA without significant disease. LVEF 60% without wall motion abnormalities.   CV Surgery: None   EP Procedures and Devices: None   Non-Invasive Evaluation(s): Pharmacologic MPI (04/15/17): Normal study without evidence of ischemia or scar. LVEF 71%. Exercise stress echo (04/04/17): Submaximal workload achieving 81% MPHR. Normal LVEF at baseline  with hyperdynamic response to stress. No EKG changes at submaximal workload. Cardiac CTA (02/23/12): Coronary calcium score 0. Left dominant. Normal coronary arteries. Exercise stress echo (01/14/12): Normal resting and stress echo images. Positive EKG changes and recovery with ST depression. Overall low risk study. TTE (01/10/12): Normal LV size and function (EF 60-65%) with normal diastolic function. Normal RV size and function. No significant valvular abnormalities. Exercise stress echo (04/13/10): Normal EKG and stress echocardiogram without evidence of ischemia. TTE (04/13/10): Normal LV size and thickness (EF 55-60%). Normal diastolic function. No significant valvular abnormalities. Normal RV size and function.  EKG:  EKG is personally reviewed.   12/11/2021: NSR at 79 bpm 10/01/2020: normal sinus rhythm at 79 bpm  Recent Labs: 07/31/2021: BUN 12; Creat 0.77; Hemoglobin 12.2; Platelets 234; Potassium 3.8; Sodium 143  Recent Lipid Panel    Component Value Date/Time   TRIG 123 05/28/2020 0758    Physical Exam:    VS:  BP 122/76    Pulse 79    Ht 5' (1.524 m)    Wt 162 lb (73.5 kg)    SpO2 98%    BMI 31.64 kg/m     Wt Readings from Last 3 Encounters:  12/11/21 162 lb (73.5 kg)  09/01/21 162 lb 6.4 oz (73.7 kg)  07/31/21 163 lb 3.2 oz (74 kg)    GEN: Well nourished, well developed in no acute distress HEENT: Normal, moist mucous membranes NECK: No JVD CARDIAC: regular rhythm, normal  S1 and S2, no rubs or gallops. No murmur. VASCULAR: Radial and DP pulses 2+ bilaterally. No carotid bruits RESPIRATORY:  Clear to auscultation without rales, wheezing or rhonchi  ABDOMEN: Soft, non-tender, non-distended MUSCULOSKELETAL:  Ambulates independently SKIN: Warm and dry, no edema NEUROLOGIC:  Alert and oriented x 3. No focal neuro deficits noted. PSYCHIATRIC:  Normal affect   ASSESSMENT:    1. Atypical chest pain   2. History of COVID-19   3. Cardiac risk counseling   4. Counseling on health promotion and disease prevention     PLAN:    Atypical chest pain -no obstructive CAD by cath in 2008. CCTA without any CAD. -She does have esophageal/GI history including dilations, but she does not feel that this is her chest pain.  -CPX [id number] without cardiopulmonary limitation -did discuss that Covid can have a variety of long term symptoms -reviewed red flag warning signs that need immediate medical attention  CV risk counseling and primary prevention: -recommend heart healthy/Mediterranean diet, with whole grains, fruits, vegetable, fish, lean meats, nuts, and olive oil. Limit salt. -recommend moderate walking, 3-5 times/week for 30-50 minutes each session. Aim for at least 150 minutes.week. Goal should be pace of 3 miles/hours, or walking 1.5 miles in 30 minutes -recommend avoidance of tobacco products. Avoid excess alcohol. -takes 81 mg aspirin daily. Given her GI symptoms and lack of CAD, if this leads to melena or GI upset, ok to stop.  Plan for follow up: PRN.  Buford Dresser, MD, PhD, [facility]   Medication Adjustments/Labs and Tests Ordered: Current medicines are reviewed at length with the patient today.  Concerns regarding medicines are outlined above.   No orders of the defined types were placed in this encounter.  No orders of the defined types were placed in this encounter.  Patient Instructions  Medication Instructions:  Your  physician recommends that you continue on your current medications as directed. Please refer to the Current Medication list given to you today.  Labwork: NONE  Testing/Procedures: NONE  Follow-Up: AS NEEDED     I,Mathew Stumpf,acting as a Education administrator for [employer], MD.,have documented all relevant documentation on the behalf of Buford Dresser, MD,as directed by  Buford Dresser, MD while in the presence of Buford Dresser, MD.  I, Buford Dresser, MD, have reviewed all documentation for this visit. The documentation on 12/11/21 for the exam, diagnosis, procedures, and orders are all accurate and complete.   Signed, Buford Dresser, MD PhD 12/11/2021  [place]

## 2021-12-11 ENCOUNTER — Encounter (HOSPITAL_BASED_OUTPATIENT_CLINIC_OR_DEPARTMENT_OTHER): Payer: Self-pay | Admitting: Cardiology

## 2021-12-11 ENCOUNTER — Other Ambulatory Visit: Payer: Self-pay

## 2021-12-11 ENCOUNTER — Ambulatory Visit (INDEPENDENT_AMBULATORY_CARE_PROVIDER_SITE_OTHER): Payer: BC Managed Care – PPO | Admitting: Cardiology

## 2021-12-11 VITALS — BP 122/76 | HR 79 | Ht 60.0 in | Wt 162.0 lb

## 2021-12-11 DIAGNOSIS — Z7189 Other specified counseling: Secondary | ICD-10-CM

## 2021-12-11 DIAGNOSIS — Z8616 Personal history of COVID-19: Secondary | ICD-10-CM | POA: Diagnosis not present

## 2021-12-11 DIAGNOSIS — R0789 Other chest pain: Secondary | ICD-10-CM | POA: Diagnosis not present

## 2021-12-11 NOTE — Patient Instructions (Signed)
Medication Instructions:  ?Your physician recommends that you continue on your current medications as directed. Please refer to the Current Medication list given to you today.  ? ?Labwork: ?NONE ? ?Testing/Procedures: ?NONE ? ?Follow-Up: ?AS NEEDED  ? ?  ?

## 2022-01-29 ENCOUNTER — Emergency Department (HOSPITAL_COMMUNITY)
Admission: EM | Admit: 2022-01-29 | Discharge: 2022-01-29 | Disposition: A | Discharge status: Home / Self Care | Payer: BC Managed Care – PPO | Attending: Emergency Medicine | Admitting: Emergency Medicine

## 2022-01-29 ENCOUNTER — Emergency Department (HOSPITAL_COMMUNITY): Payer: BC Managed Care – PPO

## 2022-01-29 ENCOUNTER — Other Ambulatory Visit: Payer: Self-pay

## 2022-01-29 ENCOUNTER — Encounter (HOSPITAL_COMMUNITY): Payer: Self-pay | Admitting: Emergency Medicine

## 2022-01-29 DIAGNOSIS — R072 Precordial pain: Secondary | ICD-10-CM | POA: Insufficient documentation

## 2022-01-29 DIAGNOSIS — R112 Nausea with vomiting, unspecified: Secondary | ICD-10-CM | POA: Insufficient documentation

## 2022-01-29 DIAGNOSIS — M549 Dorsalgia, unspecified: Secondary | ICD-10-CM | POA: Diagnosis not present

## 2022-01-29 DIAGNOSIS — Z91040 Latex allergy status: Secondary | ICD-10-CM | POA: Insufficient documentation

## 2022-01-29 DIAGNOSIS — R5383 Other fatigue: Secondary | ICD-10-CM | POA: Insufficient documentation

## 2022-01-29 DIAGNOSIS — Z7982 Long term (current) use of aspirin: Secondary | ICD-10-CM | POA: Insufficient documentation

## 2022-01-29 DIAGNOSIS — R1013 Epigastric pain: Secondary | ICD-10-CM | POA: Insufficient documentation

## 2022-01-29 DIAGNOSIS — R61 Generalized hyperhidrosis: Secondary | ICD-10-CM | POA: Diagnosis not present

## 2022-01-29 DIAGNOSIS — Z79899 Other long term (current) drug therapy: Secondary | ICD-10-CM | POA: Insufficient documentation

## 2022-01-29 DIAGNOSIS — I10 Essential (primary) hypertension: Secondary | ICD-10-CM | POA: Insufficient documentation

## 2022-01-29 LAB — BASIC METABOLIC PANEL
Anion gap: 12 (ref 5–15)
BUN: 11 mg/dL (ref 8–23)
CO2: 25 mmol/L (ref 22–32)
Calcium: 9.4 mg/dL (ref 8.9–10.3)
Chloride: 104 mmol/L (ref 98–111)
Creatinine, Ser: 0.72 mg/dL (ref 0.44–1.00)
GFR, Estimated: >60 mL/min (ref >60)
Glucose, Bld: 113 mg/dL — ABNORMAL HIGH (ref 70–99)
Potassium: 3.6 mmol/L (ref 3.5–5.1)
Sodium: 141 mmol/L (ref 135–145)

## 2022-01-29 LAB — CBC
HCT: 41.3 % (ref 36.0–46.0)
Hemoglobin: 14 g/dL (ref 12.0–15.0)
MCH: 30.6 pg (ref 26.0–34.0)
MCHC: 33.9 g/dL (ref 30.0–36.0)
MCV: 90.2 fL (ref 80.0–100.0)
Platelets: 359 10*3/uL (ref 150–400)
RBC: 4.58 MIL/uL (ref 3.87–5.11)
RDW: 11.8 % (ref 11.5–15.5)
WBC: 12.5 10*3/uL — ABNORMAL HIGH (ref 4.0–10.5)
nRBC: 0 % (ref 0.0–0.2)

## 2022-01-29 LAB — TROPONIN I (HIGH SENSITIVITY)
Troponin I (High Sensitivity): 4 ng/L (ref <18)
Troponin I (High Sensitivity): 2 ng/L (ref <18)

## 2022-01-29 MED ORDER — IOHEXOL 350 MG/ML SOLN
100.0000 mL | Freq: Once | INTRAVENOUS | Status: AC | PRN
  Administered 2022-01-29: 100 mL via INTRAVENOUS

## 2022-01-29 NOTE — ED Provider Notes (Signed)
?[facility] ?Provider Note ? ? ?CSN: [id number] ?Arrival date & time: 01/29/22  1531 ? ?  ? ?History ? ?Chief Complaint  ?Patient presents with  ? Chest Pain  ? ? ?Catherine Hancock is a 61 y.o. female. ? ?The history is provided by the patient, the spouse and medical records. No language interpreter was used.  ?Chest Pain ?Pain location:  Substernal area and epigastric ?Pain quality: sharp and stabbing   ?Pain radiates to:  Upper back and L arm ?Onset quality:  Sudden ?Timing:  Constant ?Progression:  Waxing and waning ?Chronicity:  New ?Context comment:  Eleavted BP ?Relieved by:  Nothing ?Worsened by:  Nothing ?Ineffective treatments:  None tried ?Associated symptoms: abdominal pain (upper epigastric area), back pain, diaphoresis, fatigue, nausea and vomiting   ?Associated symptoms: no altered mental status, no anxiety, no cough, no dizziness, no fever, no headache, no near-syncope, no numbness, no palpitations, no shortness of breath, no syncope and no weakness   ?Risk factors: hypertension   ? ?  ? ?Home Medications ?Prior to Admission medications   ?Medication Sig Start Date End Date Taking? Authorizing Provider  ?albuterol (ACCUNEB) 0.63 MG/3ML nebulizer solution Take by nebulization 3 (three) times daily. 07/17/20   [provider]  ?albuterol (VENTOLIN HFA) 108 (90 Base) MCG/ACT inhaler Inhale 2 puffs into the lungs every 6 (six) hours. 06/05/20   Arrien, Jimmy Picket, MD  ?ALPRAZolam Duanne Moron) 0.5 MG tablet Take 0.25 mg by mouth as directed. PRN 11/20/11   [provider]  ?aspirin EC 81 MG tablet Take 1 tablet (81 mg total) by mouth daily. 03/18/17   End, Harrell Gave, MD  ?BIOTIN PO Take 1 tablet by mouth as directed.    [provider]  ?budesonide-formoterol (SYMBICORT) 160-4.5 MCG/ACT inhaler Inhale 2 puffs into the lungs 2 (two) times daily. 02/01/20   [provider]  ?Calcium Carb-Cholecalciferol (CALCIUM 600+D3) 600-800 MG-UNIT TABS  Take 1 tablet by mouth daily. 12/09/17   [provider]  ?Cholecalciferol (VITAMIN D) 50 MCG (2000 UT) CAPS Take 1 capsule by mouth daily with breakfast. Takes in addition to weekly ergocalciferol 11/12/18   [provider]  ?citalopram (CELEXA) 20 MG tablet Take 20 mg by mouth daily.  11/10/18   [provider]  ?cyclobenzaprine (FLEXERIL) 10 MG tablet TAKE 1 TABLET THREE TIMES A DAY AS NEEDED FOR MUSCLE SPASMS ?Patient taking differently: Take 10 mg by mouth at bedtime. 03/10/16   Melvenia Beam, MD  ?diclofenac (VOLTAREN) 50 MG EC tablet Take 1 tablet by mouth 2 (two) times daily. 03/16/18   [provider]  ?DONNATAL 16.2 MG tablet Take 16.2 mg by mouth daily as needed (Stomach pain).  02/13/17   [provider]  ?EPINEPHrine 0.3 mg/0.3 mL IJ SOAJ injection Inject 0.3 mg into the muscle as needed for anaphylaxis.  05/25/19   [provider]  ?esomeprazole (NEXIUM) 40 MG capsule Take 40 mg by mouth 2 (two) times daily before a meal.  03/02/17 08/04/20  [provider]  ?HYDROcodone-acetaminophen (NORCO/VICODIN) 5-325 MG tablet Take 1 tablet by mouth every 6 (six) hours as needed for moderate pain.    [provider]  ?ibuprofen (ADVIL,MOTRIN) 800 MG tablet Take 800 mg by mouth every 8 (eight) hours as needed (pain).    [provider]  ?levocetirizine (XYZAL) 5 MG tablet Take 1 tablet by mouth daily. 07/16/19   [provider]  ?levothyroxine (SYNTHROID) 50 MCG tablet Take 75 mcg by mouth  daily. 12/11/14   [provider]  ?lubiprostone (AMITIZA) 24 MCG capsule Take 1 capsule by mouth 2 (two) times daily. 03/16/18   [provider]  ?montelukast (SINGULAIR) 10 MG tablet Take 10 mg by mouth at bedtime.    [provider]  ?nitroGLYCERIN (NITROSTAT) 0.4 MG SL tablet Place 1 tablet (0.4 mg total) under the tongue every 5 (five) minutes as needed for chest pain. 03/06/12 08/04/20  Larey Dresser, MD  ?polyethylene  glycol powder (GLYCOLAX/MIRALAX) powder Take 17 g by mouth as needed for mild constipation.     [provider]  ?Prenatal Vit-Fe Fumarate-FA (M-NATAL PLUS) 27-1 MG TABS Take 1 tablet by mouth at bedtime. 10/22/20   [provider]  ?Tiotropium Bromide Monohydrate (SPIRIVA RESPIMAT) 1.25 MCG/ACT AERS Inhale 2 puffs into the lungs daily. 09/01/21   Parrett, Fonnie Mu, NP  ?Tiotropium Bromide Monohydrate (SPIRIVA RESPIMAT) 2.5 MCG/ACT AERS Inhale 2 puffs into the lungs daily. 09/01/21   Parrett, Fonnie Mu, NP  ?topiramate (TOPAMAX) 50 MG tablet Take 50-100 mg by mouth See admin instructions. '100mg'$  by mouth at night    [provider]  ?Vitamin D, Ergocalciferol, (DRISDOL) 1.25 MG (50000 UNIT) CAPS capsule Take 50,000 Units by mouth once a week. 04/08/20   [provider]  ?zinc gluconate 50 MG tablet Take 50 mg by mouth daily.    [provider]  ?zolpidem (AMBIEN) 10 MG tablet Take 0.5 tablets by mouth at bedtime as needed for sleep.  11/20/11   [provider]  ?   ? ?Allergies    ?Gadolinium derivatives, Latex, Other, Fluticasone-salmeterol, Contrast media [iodinated contrast media], Doxycycline, and Sulfamethoxazole-trimethoprim   ? ?Review of Systems   ?Review of Systems  ?Constitutional:  Positive for diaphoresis and fatigue. Negative for chills and fever.  ?HENT:  Negative for congestion.   ?Respiratory:  Negative for cough, chest tightness, shortness of breath and wheezing.   ?Cardiovascular:  Positive for chest pain. Negative for palpitations, syncope and near-syncope.  ?Gastrointestinal:  Positive for abdominal pain (upper epigastric area), nausea and vomiting. Negative for constipation and diarrhea.  ?Genitourinary:  Negative for dysuria and frequency.  ?Musculoskeletal:  Positive for back pain.  ?Skin:  Negative for rash and wound.  ?Neurological:  Negative for dizziness, syncope, weakness, light-headedness, numbness and headaches.  ?Psychiatric/Behavioral:   Negative for agitation and confusion.   ?All other systems reviewed and are negative. ? ?Physical Exam ?Updated Vital Signs ?BP (!) 162/90 (BP Location: Right Arm)   Pulse 98   Temp 98.5 ?F (36.9 ?C) (Oral)   Resp 16   SpO2 100%  ?Physical Exam ?Vitals and nursing note reviewed.  ?Constitutional:   ?   General: She is not in acute distress. ?   Appearance: She is well-developed. She is not ill-appearing, toxic-appearing or diaphoretic.  ?HENT:  ?   Head: Normocephalic and atraumatic.  ?Eyes:  ?   Conjunctiva/sclera: Conjunctivae normal.  ?   Pupils: Pupils are equal, round, and reactive to light.  ?Cardiovascular:  ?   Rate and Rhythm: Normal rate and regular rhythm.  ?   Heart sounds: Murmur heard.  ?Pulmonary:  ?   Effort: Pulmonary effort is normal. No respiratory distress.  ?   Breath sounds: Normal breath sounds. No decreased breath sounds, wheezing, rhonchi or rales.  ?Chest:  ?   Chest wall: No tenderness.  ?Abdominal:  ?   Palpations: Abdomen is soft.  ?   Tenderness: There is no abdominal tenderness.  ?Musculoskeletal:     ?  General: No swelling.  ?   Cervical back: Neck supple.  ?   Right lower leg: No tenderness. No edema.  ?   Left lower leg: No tenderness. No edema.  ?Skin: ?   General: Skin is warm and dry.  ?   Capillary Refill: Capillary refill takes less than 2 seconds.  ?   Findings: No ecchymosis.  ?Neurological:  ?   General: No focal deficit present.  ?   Mental Status: She is alert.  ?Psychiatric:     ?   Mood and Affect: Mood normal. Mood is not anxious.  ? ? ?ED Results / Procedures / Treatments   ?Labs ?(all labs ordered are listed, but only abnormal results are displayed) ?Labs Reviewed  ?BASIC METABOLIC PANEL - Abnormal; Notable for the following components:  ?    Result Value  ? Glucose, Bld 113 (*)   ? All other components within normal limits  ?CBC - Abnormal; Notable for the following components:  ? WBC 12.5 (*)   ? All other components within normal limits  ?TROPONIN I (HIGH  SENSITIVITY)  ?TROPONIN I (HIGH SENSITIVITY)  ? ? ?EKG ?EKG Interpretation ? ?Date/Time:  Friday January 29 2022 15:41:19 EDT ?Ventricular Rate:  99 ?PR Interval:  170 ?QRS Duration: 86 ?QT Interval:  350 ?QTC C

## 2022-01-29 NOTE — ED Notes (Signed)
EDP at bedside  

## 2022-01-29 NOTE — Discharge Instructions (Signed)
Your history, exam, work-up today were overall reassuring.  Your blood pressure significant improved without needing new medications.  As her blood pressure was normal on reassessment, we agreed to hold on new medications and please call your primary care physician to discuss further blood pressure medication titration.  The CTA did not show evidence of acute aortic disease and was otherwise reassuring.  Your heart enzymes were negative both times we checked them.  We feel you are safe for discharge home after proving stability for 8 hours in the emergency department.  Please follow-up with your primary care physician and if any symptoms change or worsen acutely, please turn to the nearest emergency department.  ?

## 2022-01-29 NOTE — ED Provider Triage Note (Signed)
Emergency Medicine Provider Triage Evaluation Note ? ?Catherine Hancock , a 62 y.o. female  was evaluated in triage.  Pt complains of chest pain that radiates to her back described as stabbing pain since early this afternoon when she was making her granddaughters bed.  Patient is history of cardiac issues, diagnosed with angina 20 years ago she states and given nitroglycerin.  Patient denies any nausea, vomiting, abdominal pain, shortness of breath ? ?Review of Systems  ?Positive: Chest pain, back pain ?Negative: Abdominal pain, nausea, vomiting, diarrhea, fevers, lightheadedness or dizziness ? ?Physical Exam  ?BP (!) 162/90 (BP Location: Right Arm)   Pulse 98   Temp 98.5 ?F (36.9 ?C) (Oral)   Resp 16   SpO2 100%  ?Gen:   Awake, no distress   ?Resp:  Normal effort  ?MSK:   Moves extremities without difficulty  ?Other:   ? ?Medical Decision Making  ?Medically screening exam initiated at 3:54 PM.  Appropriate orders placed.  Catherine Hancock was informed that the remainder of the evaluation will be completed by another provider, this initial triage assessment does not replace that evaluation, and the importance of remaining in the ED until their evaluation is complete. ? ? ?  ?Azucena Cecil, PA-C ?01/29/22 1555 ? ?

## 2022-01-29 NOTE — ED Triage Notes (Signed)
Patient complains of back pain, feeling like her heart is pounding, and a headache that started at approximately 1445. Patient is alert, oriented, and in no apparent distress at this time. Reports one episodes of emesis, denies dizziness. ?

## 2022-03-03 ENCOUNTER — Ambulatory Visit (HOSPITAL_BASED_OUTPATIENT_CLINIC_OR_DEPARTMENT_OTHER): Payer: BC Managed Care – PPO | Admitting: Cardiology

## 2022-03-23 ENCOUNTER — Ambulatory Visit (INDEPENDENT_AMBULATORY_CARE_PROVIDER_SITE_OTHER): Payer: BC Managed Care – PPO | Admitting: Cardiology

## 2022-03-23 ENCOUNTER — Encounter (HOSPITAL_BASED_OUTPATIENT_CLINIC_OR_DEPARTMENT_OTHER): Payer: Self-pay | Admitting: Cardiology

## 2022-03-23 VITALS — BP 138/70 | HR 88 | Ht 60.0 in | Wt 168.2 lb

## 2022-03-23 DIAGNOSIS — Z712 Person consulting for explanation of examination or test findings: Secondary | ICD-10-CM

## 2022-03-23 DIAGNOSIS — I10 Essential (primary) hypertension: Secondary | ICD-10-CM

## 2022-03-23 DIAGNOSIS — R0789 Other chest pain: Secondary | ICD-10-CM | POA: Diagnosis not present

## 2022-03-23 DIAGNOSIS — Z7189 Other specified counseling: Secondary | ICD-10-CM | POA: Diagnosis not present

## 2022-03-23 NOTE — Patient Instructions (Signed)
Medication Instructions:  Your Physician recommend you continue on your current medication as directed.    *If you need a refill on your cardiac medications before your next appointment, please call your pharmacy*   Lab Work: None ordered today   Testing/Procedures: None ordered today   Follow-Up: At [facility], you and your health needs are our priority.  As part of our continuing mission to provide you with exceptional heart care, we have created designated Provider Care Teams.  These Care Teams include your primary Cardiologist (physician) and Advanced Practice Providers (APPs -  Physician Assistants and Nurse Practitioners) who all work together to provide you with the care you need, when you need it.  We recommend signing up for the patient portal called "MyChart".  Sign up information is provided on this After Visit Summary.  MyChart is used to connect with patients for Virtual Visits (Telemedicine).  Patients are able to view lab/test results, encounter notes, upcoming appointments, etc.  Non-urgent messages can be sent to your provider as well.   To learn more about what you can do with MyChart, go to [email or web address].    Your next appointment:   As needed  The format for your next appointment:   In Person  Provider:   Buford Dresser, MD{  Important Information About Sugar

## 2022-03-23 NOTE — Progress Notes (Signed)
Cardiology Office Note:    Date:  03/23/2022   ID:  MOE BRIER, DOB Mar 27, 1961, MRN [id number]  PCP:  Tobe Sos, MD  Cardiologist:  Buford Dresser, MD PhD (previously Dr. Saunders Revel)  Referring MD: Tobe Sos, MD   CC: Follow up  History of Present Illness:    Catherine Hancock is a 61 y.o. female with a hx of atypical chest pain, irritable bowel syndrome, hypothyroidism, OSA on CPAP who is seen for follow up today. She had Covid 19 in 05/2020.  CV history: Got Covid in July 2022, in the hospital for 9 days, received remdesivir and steroids. Had continuous chest pain and shortness of breath since then. CT showed Covid inflammatory changes. Uses O2 at home at night with her CPAP. Followed by pulmonology. Has chest tightness that improves with use of her inhaler. This is different than her typical chest pain, which is unrelated to exertion, completely random, sometimes triggered by cough.   Also had pain that last all day that is her typical chest pain. Nonexertional, can last all day/multiple days in a row. Better with lying down, no other clear aggravating/alleviating factors. It had been more severe on occasion, sometimes a 6/10, worse than it was pre Covid. Worried that it is constant.   Today: Seen in ER 01/29/22 for chest pain. hsTn normal, CTA normal. Sharp, stabbing, constant epigastric pain. Associated with nausea/vomiting. Had back pain as well. BP elevated to 162/90.  She continues to have constant chest pain. It is more sharp than dull, very localized. It is tender in her left lateral rib area under her breast but not tender in the left upper area. It is sometimes helped with nitro.   She has been started on amlodipine for blood pressure, though has not taken yet today.  Breathing is better, noted that eating kiwis seems to help. Has had stress with loss of her family member recently.  Denies shortness of breath at rest or with normal exertion. No PND,  orthopnea, LE edema or unexpected weight gain. No syncope or palpitations.    Past Medical History:  Diagnosis Date   Asthma    Asthma    Chest pain    LHC 7/08: EF 60%, normal coronaries; ETT-Echo 6/11: EF 55-60%, no ischemia   Chronic back pain    CTS (carpal tunnel syndrome)    bil   CTS (carpal tunnel syndrome)    Depression 05/28/2020   Hypothyroidism    IBS (irritable bowel syndrome)    Internal hemorrhoids    Migraines    OSA on CPAP 11/13/2017   Mild with AHI 13/hr    Pulmonary nodule, right     Past Surgical History:  Procedure Laterality Date   BREAST EXCISIONAL BIOPSY Right    benign   breast nodule surg     right breast nodule and left lymph node removed in 1994   COLONOSCOPY  06/09/2012   Procedure: COLONOSCOPY;  Surgeon: Lafayette Dragon, MD;  Location: [facility];  Service: Endoscopy;  Laterality: N/A;   EXCISION OF BREAST BIOPSY Left    2 benign lymph nodes removed   HEMORRHOID SURGERY     2007   LYMPH NODE BIOPSY     left breast   NASAL SINUS SURGERY     1992   TONSILLECTOMY     1981   TOTAL VAGINAL HYSTERECTOMY      Current Medications: Current Outpatient Medications on File Prior to Visit  Medication Sig  albuterol (ACCUNEB) 0.63 MG/3ML nebulizer solution Take 1 ampule by nebulization every 6 (six) hours as needed for wheezing or shortness of breath.   albuterol (VENTOLIN HFA) 108 (90 Base) MCG/ACT inhaler Inhale 2 puffs into the lungs every 6 (six) hours.   ALPRAZolam (XANAX) 0.5 MG tablet Take 0.25-0.5 mg by mouth 2 (two) times daily as needed for anxiety. PRN   amLODipine (NORVASC) 5 MG tablet Take 5 mg by mouth daily.   aspirin EC 81 MG tablet Take 1 tablet (81 mg total) by mouth daily.   BIOTIN PO Take 1 tablet by mouth daily.   budesonide-formoterol (SYMBICORT) 160-4.5 MCG/ACT inhaler Inhale 2 puffs into the lungs 2 (two) times daily.   Calcium Carb-Cholecalciferol (CALCIUM 600+D3) 600-800 MG-UNIT TABS Take 1 tablet by mouth daily.    citalopram (CELEXA) 20 MG tablet Take 20 mg by mouth at bedtime.   cyclobenzaprine (FLEXERIL) 10 MG tablet TAKE 1 TABLET THREE TIMES A DAY AS NEEDED FOR MUSCLE SPASMS (Patient taking differently: Take 10 mg by mouth at bedtime.)   diclofenac (VOLTAREN) 50 MG EC tablet Take 50 mg by mouth 2 (two) times daily as needed (hip pain).   DONNATAL 16.2 MG tablet Take 16.2 mg by mouth daily as needed (Stomach pain).    EPINEPHrine 0.3 mg/0.3 mL IJ SOAJ injection Inject 0.3 mg into the muscle as needed for anaphylaxis.    esomeprazole (NEXIUM) 40 MG capsule Take 40 mg by mouth 2 (two) times daily before a meal.    HYDROcodone-acetaminophen (NORCO/VICODIN) 5-325 MG tablet Take 1 tablet by mouth every 6 (six) hours as needed for moderate pain.   ibuprofen (ADVIL,MOTRIN) 800 MG tablet Take 800 mg by mouth every 8 (eight) hours as needed (pain).   levocetirizine (XYZAL) 5 MG tablet Take 1 tablet by mouth every evening.   levothyroxine (SYNTHROID) 75 MCG tablet Take 75 mcg by mouth daily.   lubiprostone (AMITIZA) 24 MCG capsule Take 24 mcg by mouth 2 (two) times daily.   montelukast (SINGULAIR) 10 MG tablet Take 10 mg by mouth at bedtime.   nitroGLYCERIN (NITROSTAT) 0.4 MG SL tablet Place 1 tablet (0.4 mg total) under the tongue every 5 (five) minutes as needed for chest pain.   polyethylene glycol powder (GLYCOLAX/MIRALAX) powder Take 17 g by mouth as needed for mild constipation.    Prenatal Vit-Fe Fumarate-FA (M-NATAL PLUS) 27-1 MG TABS Take 1 tablet by mouth at bedtime.   Vitamin D, Ergocalciferol, (DRISDOL) 1.25 MG (50000 UNIT) CAPS capsule Take 50,000 Units by mouth every Monday.   zinc gluconate 50 MG tablet Take 50 mg by mouth daily.   zolpidem (AMBIEN) 10 MG tablet Take 5 mg by mouth at bedtime as needed for sleep.   No current facility-administered medications on file prior to visit.     Allergies:   Gadolinium derivatives, Latex, Other, Fluticasone-salmeterol, Doxycycline, and  Sulfamethoxazole-trimethoprim   Social History   Tobacco Use   Smoking status: Never   Smokeless tobacco: Never  Vaping Use   Vaping Use: Never used  Substance Use Topics   Alcohol use: No   Drug use: No    Family History: The patient's family history includes Breast cancer in her maternal aunt; COPD in her father; Colon cancer in her maternal aunt, maternal grandmother, and maternal uncle; Heart disease in her maternal grandfather, paternal grandfather, and paternal uncle; Heart failure in her father; Hypertension in her father; Lymphoma in her mother; Mitral valve prolapse in her sister; Ovarian cancer in her maternal aunt;  Prostate cancer in her father.  ROS:   Please see the history of present illness.   Additional pertinent ROS negative except as documented.  EKGs/Labs/Other Studies Reviewed:    The following studies were reviewed today:  CT angio 01/29/22: IMPRESSION: No thoracoabdominal aortic aneurysm or dissection.   No acute intrathoracic or intra-abdominal pathology identified.   Small hiatal hernia   Moderate stool burden without evidence of obstruction.  CT Chest 06/09/2021: IMPRESSION: 1. There is redemonstrated irregular, generally bandlike peripheral interstitial opacity, with significant subpleural sparing, particularly at the lung bases. Overall appearance is improved compared to prior examination dated 08/01/2020, with complete resolution of previously seen ground-glass associated with these findings. This chronic organizing pneumonia appearance is particularly suggestive of sequelae of prior COVID airspace disease. 2. No significant air trapping on expiratory phase imaging. 3. Cholelithiasis.  Cardiac CT/Calcium score 10/20/2020: Aorta:  Normal size.  No calcifications.  No dissection.   Aortic Valve:  Trileaflet.  No calcifications.   Coronary Arteries:  Normal coronary origin.  Left dominance.   Left main is a large artery that gives rise to  LAD and LCX arteries. Left main is a short artery with no plaque.   LAD is a large vessel that gives rise to one diagonal artery and has no plaque.   LCX is a large dominant artery that gives rise to one large OM1 branch, PLA and PDA. There are only minimal irregularities.   RCA is a small non-dominant artery that has no plaque.   Other findings:   Normal pulmonary vein drainage into the left atrium.   Normal left atrial appendage without a thrombus.   Normal size of the pulmonary artery.   IMPRESSION: 1. Coronary calcium score of 0. This was 0 percentile for age and sex matched control.   2. Normal coronary origin with left dominance.   3. CAD-RADS 0. No evidence of CAD (0%) with only minimal irregularities. Consider non-atherosclerotic causes of chest pain.   Bilateral LE Doppler 06/04/2020: Summary:  RIGHT:  - There is no evidence of deep vein thrombosis in the lower extremity.     - No cystic structure found in the popliteal fossa.     LEFT:  - There is no evidence of deep vein thrombosis in the lower extremity.     - No cystic structure found in the popliteal fossa.  Cardiopulmonary Exercise Test 02/18/2020: Interpretation   Notes: Patient gave a very good effort. Pulse-oximetry remained 97-99% for the duration of exercise. Exercise began with stage 2 of [place] with further modifications that can be found in the time-down data attached.  Patient's Pro- Air and Symbicort were administered 15 mins after exercise.   ECG:  Resting ECG in normal sinus rhythm. HR response appropriate. There were no sustained arrhythmias or ST-T changes. BP response appropriate.   PFT:  Pre-exercise spirometry was within normal limits. The MVV was normal. Post-exercise spirometry demonstrates significantly reduced FEV1 from resting.   CPX:  Exercise Capacity- Exercise testing with gas exchange demonstrates a normal peak VO2 of 20.5 ml/kg/min (96% of the age/gender/weight matched  sedentary norms). The RER of 1.05 indicates a near maximal effort. When adjusted to the patient's ideal body weight of 124 lb (56.3 kg) the peak VO2 is 25.5 ml/kg (ibw)/min (102% of the ibw-adjusted predicted).   Cardiovascular response- The O2pulse (a surrogate for stroke volume) increased with incremental exercise reaching peak at 11 ml/beat (122% predicted).   Ventilatory response- The VE/VCO2 slope is mildly elevated and indicates  Increased dead space ventilation. The oxygen uptake efficiency slope (OUES) is normal. The VO2 at the ventilatory threshold was normal at 78% of the predicted peak VO2. At peak exercise, the ventilation reached 75% of the measured MVV and breathing reserve was 18 indicating ventilatory reserve was depleting.  PETCO2 was normal at32 mmHg during exercise.   Conclusion: Exercise testing with gas exchange demonstrates normal functional capacity when compared to matched sedentary norms. There is no indication for cardiopulmonary limitation. 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   Echo 01/07/2020:  1. Left ventricular ejection fraction, by estimation, is 60 to 65%. The  left ventricle has normal function. The left ventricle has no regional  wall motion abnormalities. Left ventricular diastolic parameters were  normal.   2. Right ventricular systolic function is normal. The right ventricular  size is normal.   3. The mitral valve is normal in structure. No evidence of mitral valve  regurgitation. No evidence of mitral stenosis.   4. The aortic valve is tricuspid. Aortic valve regurgitation is not  visualized. No aortic stenosis is present.   5. The inferior vena cava is normal in size with greater than 50%  respiratory variability, suggesting right atrial pressure of 3 mmHg.   Comparison(s): No prior Echocardiogram. Stress echo 04/04/17 EF 55%.    Conclusion(s)/Recommendation(s): Normal biventricular function without  evidence of hemodynamically significant valvular heart disease.   Monitor 09/2019: ~12.5 days of data recorded on Zio monitor. Patient had a min HR of 53 bpm, max HR of 167 bpm, and avg HR of 76 bpm. Predominant underlying rhythm was Sinus Rhythm. No VT, atrial fibrillation, high degree block, or pauses noted. Isolated atrial and ventricular ectopy was rare (<1%). There were 8 triggered events, which were either normal sinus rhythm or sinus tachycardia. There were 4 labeled SVT events, but on review some were either isolated PACs or sinus tachycardia. There were two events that are likely SVT, though cannot be fully evaluated. The possible SVT run with the fastest interval lasted 15 beats with a max rate of 167 bpm, and the longest lasting was 13 beats with an avg rate of 132 bpm. These did occur within 45 seconds of symptoms.  Cath/PCI: LHC (02/01/07): LMCA normal. LAD with 2 large diagonal branches, normal. Dominant LCx without significant disease. Nondominant RCA without significant disease. LVEF 60% without wall motion abnormalities.    Non-Invasive Evaluation(s): Pharmacologic MPI (04/15/17): Normal study without evidence of ischemia or scar. LVEF 71%. Exercise stress echo (04/04/17): Submaximal workload achieving 81% MPHR. Normal LVEF at baseline with hyperdynamic response to stress. No EKG changes at submaximal workload. Cardiac CTA (02/23/12): Coronary calcium score 0. Left dominant. Normal coronary arteries. Exercise stress echo (01/14/12): Normal resting and stress echo images. Positive EKG changes and recovery with ST depression. Overall low risk study. TTE (01/10/12): Normal LV size and function (EF 60-65%) with normal diastolic function. Normal RV size and function. No significant valvular abnormalities. Exercise stress echo (04/13/10): Normal EKG and stress echocardiogram without evidence of ischemia. TTE (04/13/10):  Normal LV size and thickness (EF 55-60%). Normal diastolic function. No significant valvular abnormalities. Normal RV size and function.  EKG:  EKG is personally reviewed.   5.23.23: NSR, unchanged R wave progression, 88 bpm 12/11/2021: NSR at 79 bpm 10/01/2020: normal sinus rhythm at 79 bpm  Recent Labs: 01/29/2022: BUN 11; Creatinine, Ser 0.72; Hemoglobin 14.0; Platelets 359; Potassium 3.6; Sodium 141  Recent  Lipid Panel    Component Value Date/Time   TRIG 123 05/28/2020 0758    Physical Exam:    VS:  BP 138/70 (BP Location: Left Arm, Patient Position: Sitting)   Pulse 88   Ht 5' (1.524 m)   Wt 168 lb 3.2 oz (76.3 kg)   BMI 32.85 kg/m     Wt Readings from Last 3 Encounters:  03/23/22 168 lb 3.2 oz (76.3 kg)  01/29/22 160 lb (72.6 kg)  12/11/21 162 lb (73.5 kg)    GEN: Well nourished, well developed in no acute distress HEENT: Normal, moist mucous membranes NECK: No JVD CARDIAC: regular rhythm, normal S1 and S2, no rubs or gallops. No murmur. Tender to palpation left lateral ribs, under breast VASCULAR: Radial and DP pulses 2+ bilaterally. No carotid bruits RESPIRATORY:  Clear to auscultation without rales, wheezing or rhonchi  ABDOMEN: Soft, non-tender, non-distended MUSCULOSKELETAL:  Ambulates independently SKIN: Warm and dry, no edema NEUROLOGIC:  Alert and oriented x 3. No focal neuro deficits noted. PSYCHIATRIC:  Normal affect    ASSESSMENT:    1. Atypical chest pain   2. Encounter to discuss test results   3. Cardiac risk counseling   4. Counseling on health promotion and disease prevention   5. Essential hypertension    PLAN:    Atypical chest pain -no obstructive CAD by cath in 2008. CCTA without any CAD. No evidence of disease on recent CTA, normal hsTn. All suggest noncardiac etiology of pain. She has some tenderness but also areas that are not tender. Given focal nature, constancy, suspect MSK etiology. Also discussed that chronic nerve pain can cause this  as well. Recommended she follow up with her PCP. -She does have esophageal/GI history including dilations, but she does not feel that this is her chest pain. We did discuss that nitro can help noncardiac chest pain as well -CPX [phone] without cardiopulmonary limitation -reviewed red flag warning signs that need immediate medical attention -does not need aspirin from a cardiac perspective.  Hypertension: recently started amlodipine, has not taken yet today. Will follow up with her PCP  CV risk counseling and primary prevention: -recommend heart healthy/Mediterranean diet, with whole grains, fruits, vegetable, fish, lean meats, nuts, and olive oil. Limit salt. -recommend moderate walking, 3-5 times/week for 30-50 minutes each session. Aim for at least 150 minutes.week. Goal should be pace of 3 miles/hours, or walking 1.5 miles in 30 minutes -recommend avoidance of tobacco products. Avoid excess alcohol. -takes 81 mg aspirin daily. Given her GI symptoms and lack of CAD, if this leads to melena or GI upset, ok to stop.  Plan for follow up: PRN.  Buford Dresser, MD, PhD, [facility]   Medication Adjustments/Labs and Tests Ordered: Current medicines are reviewed at length with the patient today.  Concerns regarding medicines are outlined above.   Orders Placed This Encounter  Procedures   EKG 12-Lead   No orders of the defined types were placed in this encounter.  Patient Instructions  Medication Instructions:  Your Physician recommend you continue on your current medication as directed.    *If you need a refill on your cardiac medications before your next appointment, please call your pharmacy*   Lab Work: None ordered today   Testing/Procedures: None ordered today   Follow-Up: At [facility], you and your health needs are our priority.  As part of our continuing mission to provide you with exceptional heart care, we have created designated  Provider Care  Teams.  These Care Teams include your primary Cardiologist (physician) and Advanced Practice Providers (APPs -  Physician Assistants and Nurse Practitioners) who all work together to provide you with the care you need, when you need it.  We recommend signing up for the patient portal called "MyChart".  Sign up information is provided on this After Visit Summary.  MyChart is used to connect with patients for Virtual Visits (Telemedicine).  Patients are able to view lab/test results, encounter notes, upcoming appointments, etc.  Non-urgent messages can be sent to your provider as well.   To learn more about what you can do with MyChart, go to [email or web address].    Your next appointment:   As needed  The format for your next appointment:   In Person  Provider:   Buford Dresser, MD{  Important Information About Sugar          Signed, Buford Dresser, MD PhD 03/23/2022  [place]

## 2022-06-14 NOTE — Telephone Encounter (Signed)
Seems like encounter was open in error so closing encounter.  

## 2022-06-22 DIAGNOSIS — G5603 Carpal tunnel syndrome, bilateral upper limbs: Secondary | ICD-10-CM | POA: Insufficient documentation

## 2022-10-21 DIAGNOSIS — E538 Deficiency of other specified B group vitamins: Secondary | ICD-10-CM | POA: Insufficient documentation

## 2022-10-21 DIAGNOSIS — K581 Irritable bowel syndrome with constipation: Secondary | ICD-10-CM | POA: Insufficient documentation

## 2023-10-11 NOTE — Patient Instructions (Signed)
ICD-10-CM   1. Moderate persistent asthma without complication  Z20.80     2. COVID-19 long hauler manifesting chronic dyspnea  R06.09    U09.9     3. OSA on CPAP  G47.33    Z99.89     4. Other form of dyspnea  R06.09     5. 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 [first documented on pulmonary stress test April 2021] and ongoing asthma as additional reasons for current shortness of breath and cough  -Noted 3 prednisone courses in 2022 including April 2022, July 2022 and August 2022 for asthma flareup  Topiramate is also known because of shortness of breath    Plan -Flu shot today -Respect lack of desire to have COVID-vaccine but at this point you  probably have a high degree of natural immunity  - Do CBC with differential, chemistry, blood IgE -If topiramate is not the cause then we will consider an asthma biologic  -tepelzeumab if blood IgE and eosinophils are normal -Continue Symbicort and Singulair as before -Continue 2 L nasal cannula with CPAP at night  Follow-up - Return to see nurse practitioner in the next few to several weeks to discuss results and decide the next step

## 2023-10-11 NOTE — Progress Notes (Unsigned)
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 Paroxysmal nocturnal dyspnea  R06.00 Ambulatory referral to ENT    Ambulatory referral to Gastroenterology    CBC w/Diff    IgE    Pulmonary function test    2. Moderate asthma without complication, unspecified whether persistent  J45.909 Ambulatory referral to ENT    Ambulatory referral to Gastroenterology    CBC w/Diff    IgE    Pulmonary function test    3. Hiatal hernia  K44.9 Ambulatory referral to ENT    Ambulatory referral to Gastroenterology    CBC w/Diff    IgE    Pulmonary function test    4. Atypical chest pain  R07.89 Ambulatory referral to ENT    Ambulatory referral to Gastroenterology    CBC w/Diff    IgE    Pulmonary function test    5. Chest pain, unspecified type  R07.9 Ambulatory referral to ENT    Ambulatory referral to  Gastroenterology    CT Chest High Resolution    CBC w/Diff    IgE    Pulmonary function test         Plan:     Patient Instructions     ICD-10-CM   1. Paroxysmal nocturnal dyspnea  R06.00     2. Moderate asthma without complication, unspecified whether persistent  J45.909     3. Hiatal hernia  K44.9     4. Atypical chest pain  R07.89      Unclear if is asthma or something else going on such as acid reflux. If asthma, then need to figure out step up treatment options   Plan - - Do CBC with differential, blood IgE - Do HRCT supine and prone ( 18 months ago normal lungs on CT)  - Refer [facility] - do full PFT - refer pH Probe study with Dr Lavon Paganini --Continue Symbicort and Singulair as before -Continue 2 L nasal cannula with CPAP at night  Follow-up - Return to see Remie Mathison in 8-12  weeks but after completing above  - if asthma then consider empiric tezpire   FOLLOWUP Return in about 12 weeks (around 01/04/2024) for 15 min visit, with Dr Marchelle Gearing, Face to Face Visit.    SIGNATURE    Dr. Kalman Shan, M.D., F.C.C.P,  Pulmonary and Critical Care Medicine Staff Physician, [facility] Center Director - Interstitial Lung Disease  Program  Pulmonary Fibrosis [facility] at [facility] [place], [place], [address]  Pager: [phone], If no answer or between  15:00h - 7:00h: call [phone] Telephone: [phone]  8:52 AM 10/12/2023

## 2023-10-12 ENCOUNTER — Encounter: Payer: Self-pay | Admitting: Internal Medicine

## 2023-10-12 ENCOUNTER — Ambulatory Visit (INDEPENDENT_AMBULATORY_CARE_PROVIDER_SITE_OTHER): Payer: BC Managed Care – PPO | Admitting: Internal Medicine

## 2023-10-12 VITALS — BP 108/70 | HR 80 | Ht 60.0 in

## 2023-10-12 DIAGNOSIS — R0789 Other chest pain: Secondary | ICD-10-CM

## 2023-10-12 DIAGNOSIS — R0600 Dyspnea, unspecified: Secondary | ICD-10-CM | POA: Diagnosis not present

## 2023-10-12 DIAGNOSIS — J45909 Unspecified asthma, uncomplicated: Secondary | ICD-10-CM

## 2023-10-12 DIAGNOSIS — K449 Diaphragmatic hernia without obstruction or gangrene: Secondary | ICD-10-CM | POA: Diagnosis not present

## 2023-10-12 DIAGNOSIS — R079 Chest pain, unspecified: Secondary | ICD-10-CM

## 2023-10-12 LAB — CBC WITH DIFFERENTIAL/PLATELET
Basophils Absolute: 0.1 10*3/uL (ref 0.0–0.1)
Basophils Relative: 1.4 % (ref 0.0–3.0)
Eosinophils Absolute: 0.3 10*3/uL (ref 0.0–0.7)
Eosinophils Relative: 5 % (ref 0.0–5.0)
HCT: 38.1 % (ref 36.0–46.0)
Hemoglobin: 13.2 g/dL (ref 12.0–15.0)
Lymphocytes Relative: 31.1 % (ref 12.0–46.0)
Lymphs Abs: 1.8 10*3/uL (ref 0.7–4.0)
MCHC: 34.5 g/dL (ref 30.0–36.0)
MCV: 91.4 fL (ref 78.0–100.0)
Monocytes Absolute: 0.5 10*3/uL (ref 0.1–1.0)
Monocytes Relative: 8.7 % (ref 3.0–12.0)
Neutro Abs: 3.2 10*3/uL (ref 1.4–7.7)
Neutrophils Relative %: 53.8 % (ref 43.0–77.0)
Platelets: 341 10*3/uL (ref 150.0–400.0)
RBC: 4.17 Mil/uL (ref 3.87–5.11)
RDW: 12.3 % (ref 11.5–15.5)
WBC: 5.9 10*3/uL (ref 4.0–10.5)

## 2023-10-12 NOTE — Progress Notes (Signed)
Blood eosinophils high and might qualify for Dupixent but we will address this at the time of follow-up

## 2023-10-13 LAB — IGE: IgE (Immunoglobulin E), Serum: 6 kU/L (ref <=114)

## 2023-10-18 ENCOUNTER — Ambulatory Visit (HOSPITAL_BASED_OUTPATIENT_CLINIC_OR_DEPARTMENT_OTHER): Payer: BC Managed Care – PPO | Admitting: Cardiology

## 2023-10-18 ENCOUNTER — Encounter (HOSPITAL_BASED_OUTPATIENT_CLINIC_OR_DEPARTMENT_OTHER): Payer: Self-pay | Admitting: Cardiology

## 2023-10-18 VITALS — BP 102/64 | HR 70 | Ht 60.0 in | Wt 142.0 lb

## 2023-10-18 DIAGNOSIS — R0789 Other chest pain: Secondary | ICD-10-CM | POA: Diagnosis not present

## 2023-10-18 DIAGNOSIS — I10 Essential (primary) hypertension: Secondary | ICD-10-CM | POA: Diagnosis not present

## 2023-10-18 DIAGNOSIS — Z7189 Other specified counseling: Secondary | ICD-10-CM | POA: Diagnosis not present

## 2023-10-18 NOTE — Progress Notes (Signed)
Cardiology Office Note:  .   Date:  10/18/2023  ID:  Catherine Hancock, DOB 04-26-1961, MRN [id number] PCP: Elise Benne, MD  [facility] Providers Cardiologist:  Jodelle Red, MD Sleep Medicine:  Armanda Magic, MD {  History of Present Illness: Catherine Hancock is a 62 y.o. female  with a hx of atypical chest pain, irritable bowel syndrome, hypothyroidism, OSA on CPAP who is seen for follow up today. She had Covid 19 in 05/2020.   CV history: Got Covid in July 2022, in the hospital for 9 days, received remdesivir and steroids. Had continuous chest pain and shortness of breath since then. CT showed Covid inflammatory changes. Uses O2 at home at night with her CPAP. Followed by pulmonology. Has chest tightness that improves with use of her inhaler. This is different than her typical chest pain, which is unrelated to exertion, completely random, sometimes triggered by cough.    Also had pain that last all day that is her typical chest pain. Nonexertional, can last all day/multiple days in a row. Better with lying down, no other clear aggravating/alleviating factors. It had been more severe on occasion, sometimes a 6/10, worse than it was pre Covid. Worried that it is constant.   Today: Continues to have constant chest pain on the left side, varies between sharp and dull, no triggers, nothing makes it better. Nonradiating, in the breast area. Not positional, not exertional, not related to food. Hasn't tried pushing on it. Breathing is stable now, but has had 6 asthma attacks this year.   When she works in the yard, her BP goes up, HR goes up, feels flushed. Has been going on for years. Better when she rests.  ROS: Denies shortness of breath at rest or with normal exertion. No PND, orthopnea, LE edema or unexpected weight gain. No syncope or palpitations. ROS otherwise negative except as noted.   Studies Reviewed: Marland Kitchen    EKG:  EKG Interpretation Date/Time:  Tuesday October 18 2023 11:51:23 EST Ventricular Rate:  70 PR Interval:  192 QRS Duration:  98 QT Interval:  432 QTC Calculation: 466 R Axis:   12  Text Interpretation: Normal sinus rhythm Normal ECG Confirmed by Jodelle Red [phone]) on 10/18/2023 11:53:56 AM    Physical Exam:   VS:  BP 102/64 (BP Location: Left Arm, Patient Position: Sitting, Cuff Size: Normal)   Pulse 70   Ht 5' (1.524 m)   Wt 142 lb (64.4 kg)   SpO2 99%   BMI 27.73 kg/m    Wt Readings from Last 3 Encounters:  10/18/23 142 lb (64.4 kg)  03/23/22 168 lb 3.2 oz (76.3 kg)  01/29/22 160 lb (72.6 kg)    GEN: Well nourished, well developed in no acute distress HEENT: Normal, moist mucous membranes NECK: No JVD CARDIAC: regular rhythm, normal S1 and S2, no rubs or gallops. No murmur. Tender to palpation VASCULAR: Radial and DP pulses 2+ bilaterally. No carotid bruits RESPIRATORY:  Clear to auscultation without rales, wheezing or rhonchi  ABDOMEN: Soft, non-tender, non-distended MUSCULOSKELETAL:  Ambulates independently SKIN: Warm and dry, no edema NEUROLOGIC:  Alert and oriented x 3. No focal neuro deficits noted. PSYCHIATRIC:  Normal affect    ASSESSMENT AND PLAN: .    Atypical chest pain -no CAD by cath in 2008. CCTA without any CAD 2013. No evidence of disease on 2021 CCTA. CPX 2021 without cardiopulmonary limitation -tender on palpation today -discussed extensive prior testing, physical exam findings.  Suggests MSK chest pain. Given conservative management recommendations.  -reviewed red flag warning signs that need immediate medical attention -does not need aspirin from a cardiac perspective.   Hypertension: stable on amlodipine  CV risk counseling and prevention -recommend heart healthy/Mediterranean diet, with whole grains, fruits, vegetable, fish, lean meats, nuts, and olive oil. Limit salt. -recommend moderate walking, 3-5 times/week for 30-50 minutes each session. Aim for at least 150 minutes.week. Goal  should be pace of 3 miles/hours, or walking 1.5 miles in 30 minutes -recommend avoidance of tobacco products. Avoid excess alcohol.  Dispo: as needed  Signed, Jodelle Red, MD   Jodelle Red, MD, PhD, [facility]  [facility]  [place]  Heart & Vascular at [facility] at [facility] [address] [place], [place] [address] [phone]

## 2023-10-18 NOTE — Patient Instructions (Signed)
Medication Instructions:  ?Your physician recommends that you continue on your current medications as directed. Please refer to the Current Medication list given to you today.  ? ?Labwork: ?NONE ? ?Testing/Procedures: ?NONE ? ?Follow-Up: ?AS NEEDED  ? ?  ?

## 2023-10-28 ENCOUNTER — Encounter (INDEPENDENT_AMBULATORY_CARE_PROVIDER_SITE_OTHER): Payer: Self-pay | Admitting: Otolaryngology

## 2023-11-08 ENCOUNTER — Ambulatory Visit
Admission: RE | Admit: 2023-11-08 | Discharge: 2023-11-08 | Disposition: A | Discharge status: Home / Self Care | Payer: BC Managed Care – PPO | Source: Ambulatory Visit | Attending: Internal Medicine | Admitting: Internal Medicine

## 2023-11-08 DIAGNOSIS — R079 Chest pain, unspecified: Secondary | ICD-10-CM

## 2023-11-17 ENCOUNTER — Telehealth: Payer: Self-pay | Admitting: Internal Medicine

## 2023-11-17 NOTE — Telephone Encounter (Signed)
Patient would like results of CT scan. Patient phone number is [phone].

## 2023-11-28 NOTE — Telephone Encounter (Signed)
CT scan findings are listed below and they are stable compared to 2022 which is 2-1/2 years ago.  I will see you in March 2025 with breathing test.  We can go over these in detail at that time.      IMPRESSION: 1. Mild to moderate patchy air trapping throughout both lungs, indicative of small airways disease. Wispy patchy peribronchovascular, perilobular and subpleural ground-glass and reticulation throughout both lungs with associated mild architectural distortion. No bronchiectasis or honeycombing. Findings are not substantially changed from 06/09/2021 chest CT. 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 Findings are suggestive of an alternative diagnosis (not UIP) per consensus guidelines: Diagnosis of Idiopathic Pulmonary Fibrosis: An Official ATS/ERS/JRS/ALAT Clinical Practice Guideline. Am Rosezetta Schlatter Crit Care Med Vol 198, Iss 5, [phone], Jul 02 2017. 2. Small hiatal hernia.     Electronically Signed   By: Delbert Phenix M.D.   On: 11/18/2023 15:51

## 2023-11-29 NOTE — Telephone Encounter (Signed)
Patient made aware of CT results Reminded of appt with PFT and MR will go over all results in detail.

## 2023-12-02 ENCOUNTER — Ambulatory Visit (INDEPENDENT_AMBULATORY_CARE_PROVIDER_SITE_OTHER): Payer: BC Managed Care – PPO | Admitting: Otolaryngology

## 2023-12-02 ENCOUNTER — Encounter (INDEPENDENT_AMBULATORY_CARE_PROVIDER_SITE_OTHER): Payer: Self-pay | Admitting: Otolaryngology

## 2023-12-02 VITALS — BP 96/63 | HR 88

## 2023-12-02 DIAGNOSIS — R0981 Nasal congestion: Secondary | ICD-10-CM

## 2023-12-02 DIAGNOSIS — J329 Chronic sinusitis, unspecified: Secondary | ICD-10-CM | POA: Diagnosis not present

## 2023-12-02 DIAGNOSIS — R0982 Postnasal drip: Secondary | ICD-10-CM | POA: Diagnosis not present

## 2023-12-02 DIAGNOSIS — K219 Gastro-esophageal reflux disease without esophagitis: Secondary | ICD-10-CM | POA: Diagnosis not present

## 2023-12-02 DIAGNOSIS — J3089 Other allergic rhinitis: Secondary | ICD-10-CM

## 2023-12-02 DIAGNOSIS — G4733 Obstructive sleep apnea (adult) (pediatric): Secondary | ICD-10-CM | POA: Diagnosis not present

## 2023-12-02 DIAGNOSIS — R053 Chronic cough: Secondary | ICD-10-CM

## 2023-12-02 MED ORDER — FLUTICASONE PROPIONATE 50 MCG/ACT NA SUSP: 2.0000 | Freq: Every day | NASAL | 6 refills | Status: AC

## 2023-12-02 NOTE — Patient Instructions (Signed)

## 2023-12-02 NOTE — Progress Notes (Signed)
ENT CONSULT:  Reason for Consult: chronic cough    HPI: Discussed the use of AI scribe software for clinical note transcription with the patient, who gave verbal consent to proceed.  History of Present Illness   The patient is a 63 yoF who presents with chronic cough. She was referred by Dr. Colletta Maryland for evaluation of her chronic cough.  She has experienced a chronic cough since her forties, which can be both dry and productive. The mucus is often green, white, or clear. The last productive cough episode occurred around Christmas, approximately two months ago, for which she took Mucinex. She denies recent sinus scans but has a history of sinus surgery for nasal polyp removal about 32 years ago.  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 She takes Xyzal daily at night for allergies. She was previously on Singulair but discontinued it due to a black box warning, although she keeps it for use during cold season. She does not currently use nasal sprays.  She experiences heartburn and takes Nexium 40 mg daily. She also reports ear pain, particularly in the right ear, and a history of fluid in her ears. Her ears hurt all the time, with the right ear bothering her this week.  She describes experiencing dry mouth and notes that her sense of smell is generally good. She has a history of secondhand smoke exposure as a child but has never been a smoker herself. She uses albuterol and Symbicort inhalers but had a vision-related reaction to a powdered inhaler in the past.       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  Self-referred for shortness of breath and cough.  She tells me that she has had a lifelong history of asthma for which she is on Symbicort and Singulair on a scheduled basis.  Then in 2018 Easter she had  influenza treated with Tamiflu as an outpatient in [facility] after family cluster.  Since then she has had cough that has persisted all along.  The cough is mild and persistent.  Never really improved.  Then in early 2020 she developed insidious onset of shortness of breath that has progressed.  She tells me she can get short of breath anytime.  At night she can wake up occasionally because of the cough but never because of shortness of breath.  There is no orthopnea proximal nocturnal dyspnea but in the daytime she perceives dyspnea for anything and randomly.  She says that when she walks definitely the shortness of breath is worse and relieved by rest there is no associated chest pain.  She has an upcoming echocardiogram.  Review of the outside notes indicate she has had cardiology work-up that has been negative.  Overall the shortness of breath is rated currently as moderate with the cough as mild and the wheezing is extremely mild.   Walking desaturation test today on room air shows pulse ox 100% on room air with a heart rate of 88/min.  After walking 3 laps her pulse ox was 98% with a heart rate of 107/min.  She walked at average pace and she was mildly dyspneic with this.  Review of the chart indicates a history of right lower lobe lung nodule.  However there is no imaging for me to visualize.  She said rheumatoid factor test for unclear reasons for  years ago and was normal   She has a history of allergies.  She says she has had a skin test many many years ago and this was positive for oak.  Otherwise details not known.  She is willing to have a full work-up at this point in time.     In terms of asthma control questionnaire she says she is waking up several times at night because of asthma.  When she wakes up she has moderate symptoms she is moderately limited in activities.  She is experiencing shortness of breath quite a lot but she is wheezing hardly any of the time.  Overall score average score  would be 2.8 showing significant level of symptoms.  [name] 63 y.o. -here to review results of below work-up. In the interim she says her symptoms persist but the cough and the shortness of breath. Without any change. I she again insisted the shortness of breath happens randomly and she has to take a deep breath. She also has left inframammary tightness that is chronic. She had a recent echocardiogram that is normal. Allergy work-up and autoimmune is normal. Pulmonary function test not done yet because of the pandemic and disruptions in the PFT lab. CT scan of the chest personally visualized and interpreted is essentially normal except for thickened airways of chronic bronchitis. She is compliant with the Symbicort. She takes albuterol as needed. She has been taking Symbicort for a few years. She also has a cough but not resolved. She has not seen ENT. She used to see Dr. Jearld Fenton many years ago. She is not had pulmonary stress test.   At last evaluation by Dr. 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  Unclear if is asthma or something else going on such as acid reflux. If asthma, then need to figure out step up treatment options     Plan - - Do CBC with differential, blood IgE - Do HRCT supine and prone ( 18 months ago normal lungs on CT)  - Refer [facility] - do full PFT - refer pH Probe study with Dr Lavon Paganini --Continue Symbicort and Singulair as before -Continue 2 L nasal cannula with CPAP at night   Follow-up - Return to see Ramaswamy in 8-12 weeks but after completing above             - if asthma then consider empiric tezpire     FOLLOWUP Return in about 12 weeks (around 01/04/2024) for 15 min visit, with Dr Marchelle Gearing, Face to Face Visit.    Past Medical History:  Diagnosis Date   Asthma    Asthma    Chest pain    LHC 7/08: EF 60%, normal coronaries; ETT-Echo 6/11: EF  55-60%, no ischemia   Chronic back pain    CTS (carpal tunnel syndrome)    bil   CTS (carpal tunnel syndrome)    Depression 05/28/2020   Hypothyroidism    IBS (irritable bowel syndrome)    Internal hemorrhoids    Migraines    OSA on CPAP 11/13/2017   Mild with AHI 13/hr    Pulmonary nodule, right     Past Surgical History:  Procedure Laterality Date   BREAST EXCISIONAL BIOPSY Right    benign   breast nodule surg     right breast nodule and left lymph node removed in 1994   COLONOSCOPY  06/09/2012   Procedure:  COLONOSCOPY;  Surgeon: Hart Carwin, MD;  Location: [facility];  Service: Endoscopy;  Laterality: N/A;   EXCISION OF BREAST BIOPSY Left    2 benign lymph nodes removed   HEMORRHOID SURGERY     2007   LYMPH NODE BIOPSY     left breast   NASAL SINUS SURGERY     1992   TONSILLECTOMY     1981   TOTAL VAGINAL HYSTERECTOMY      Family History  Problem Relation Age of Onset   Colon cancer Maternal Aunt        x 4   Colon cancer Maternal Uncle    Colon cancer Maternal Grandmother    Heart disease Maternal Grandfather    Breast cancer Maternal Aunt    Ovarian cancer Maternal Aunt    Lymphoma Mother    Prostate cancer Father    COPD Father    Heart failure Father    Hypertension Father    Heart disease Paternal Uncle        x 4   Heart disease Paternal Grandfather    Mitral valve prolapse Sister     Social History:  reports that she has never smoked. She has never used smokeless tobacco. She reports that she does not drink alcohol and does not use drugs.  Allergies:  Allergies  Allergen Reactions   Gadolinium Derivatives Other (See Comments)    Severe pain, joints locked up   Latex Other (See Comments) and Shortness Of Breath    Asthma attack    Other Shortness Of Breath and Other (See Comments)    Severe pain, joints locked up gadolinium contrast-causes joints to lock up, severe pain Severe pain, joints locked up gadolinium contrast-causes joints to  lock up, severe pain  Asthma attack   Fluticasone-Salmeterol Other (See Comments)    Blurry vision Blurry vision Blurry vision Blurry vision Blurry vision   Iodinated Contrast Media Other (See Comments)    gadolinium contrast-causes joints to lock up, severe pain   Doxycycline Itching and Rash   Sulfamethoxazole-Trimethoprim Diarrhea and Nausea And Vomiting    REACTION: GI upset Other reaction(s): OTHER REACTION: GI upset Other reaction(s): OTHER Other reaction(s): OTHER    Medications: I have reviewed the patient's current medications.  The PMH, PSH, Medications, Allergies, and SH were reviewed and updated.  ROS: Constitutional: Negative for fever, weight loss and weight gain. Cardiovascular: Negative for chest pain and dyspnea on exertion. Respiratory: Is not experiencing shortness of breath at rest. Gastrointestinal: Negative for nausea and vomiting. Neurological: Negative for headaches. Psychiatric: The patient is not nervous/anxious  Blood pressure 96/63, pulse 88, SpO2 97%.  PHYSICAL EXAM:  Exam: General: Well-developed, well-nourished Communication and Voice: Clear pitch and clarity Respiratory Respiratory effort: Equal inspiration and expiration without stridor Cardiovascular Peripheral Vascular: Warm extremities with equal color/perfusion Eyes: No nystagmus with equal extraocular motion bilaterally Neuro/Psych/Balance: Patient oriented to person, place, and time; Appropriate mood and affect; Gait is intact with no imbalance; Cranial nerves I-XII are intact Head and Face Inspection: Normocephalic and atraumatic without mass or lesion Palpation: Facial skeleton intact without bony stepoffs Salivary Glands: No mass or tenderness Facial Strength: Facial motility symmetric and full bilaterally ENT Pinna: External ear intact and fully developed External canal: Canal is patent with intact skin Tympanic Membrane: Clear and mobile External Nose: No scar or  anatomic deformity Internal Nose: Septum is S-shaped with narrow nasal passages. No polyp, or purulence. Mucosal edema and erythema present. Surgical changes seen on the left.  Bilateral inferior turbinate hypertrophy.  Lips, Teeth, and gums: Mucosa and teeth intact and viable TMJ: No pain to palpation with full mobility Oral cavity/oropharynx: No erythema or exudate, no lesions present Nasopharynx: No mass or lesion with intact mucosa Hypopharynx: Intact mucosa without pooling of secretions Larynx Glottic: Full true vocal cord mobility without lesion or mass Supraglottic: Normal appearing epiglottis and AE folds Interarytenoid Space: Moderate pachydermia&edema Subglottic Space: Patent without lesion or edema Neck Neck and Trachea: Midline trachea without mass or lesion Thyroid: No mass or nodularity Lymphatics: No lymphadenopathy  Procedure:  Preoperative diagnosis: chronic cough   Postoperative diagnosis:   Same + GERD LPR  Procedure: Flexible fiberoptic laryngoscopy  Surgeon: Ashok Croon, MD  Anesthesia: Topical lidocaine and Afrin Complications: None Condition is stable throughout exam  Indications and consent:  The patient presents to the clinic. Indirect laryngoscopy view was incomplete. Thus it was recommended that they undergo a flexible fiberoptic laryngoscopy. All of the risks, benefits, and potential complications were reviewed with the patient preoperatively and verbal informed consent was obtained.  Procedure: The patient was seated upright in the clinic. Topical lidocaine and Afrin were applied to the nasal cavity. After adequate anesthesia had occurred, I then proceeded to pass the flexible telescope into the nasal cavity. The nasal cavity was patent without rhinorrhea or polyp. The nasopharynx was also patent without mass or lesion. The base of tongue was visualized and was normal. There were no signs of pooling of secretions in the piriform sinuses. The true  vocal folds were mobile bilaterally. There were no signs of glottic or supraglottic mucosal lesion or mass. There was moderate interarytenoid pachydermia and post cricoid edema. The telescope was then slowly withdrawn and the patient tolerated the procedure throughout.    PROCEDURE NOTE: nasal endoscopy  Preoperative diagnosis: chronic sinusitis symptoms and hx of FESS removal of  nasal polyps 30 yrs ago  Postoperative diagnosis: same  Procedure: Diagnostic nasal endoscopy (16109)  Surgeon: Ashok Croon, M.D.  Anesthesia: Topical lidocaine and Afrin  H&P REVIEW: The patient's history and physical were reviewed today prior to procedure. All medications were reviewed and updated as well. Complications: None Condition is stable throughout exam Indications and consent: The patient presents with symptoms of chronic sinusitis not responding to previous therapies. All the risks, benefits, and potential complications were reviewed with the patient preoperatively and informed consent was obtained. The time out was completed with confirmation of the correct procedure.   Procedure: The patient was seated upright in the clinic. Topical lidocaine and Afrin were applied to the nasal cavity. After adequate anesthesia had occurred, the rigid nasal endoscope was passed into the nasal cavity. The nasal mucosa, turbinates, septum, and sinus drainage pathways were visualized bilaterally. This revealed no purulence or significant secretions that might be cultured. There were no polyps or sites of significant inflammation. The mucosa was intact and there was no crusting present. The scope was then slowly withdrawn and the patient tolerated the procedure well. There were no complications or blood loss.    Studies Reviewed: CT chest CT chest high resolution   IMPRESSION: 1. No evidence of interstitial lung disease. 2. Nonspecific mild diffuse bronchial wall thickening, as can be seen with reactive airways  disease or chronic bronchitis. 3. Small hiatal hernia. 4. Cholelithiasis.  09/19/2020-pulmonary function test-FVC 2.64 (92% addicted), TLC 3.28 (73% percent predicted), postbronchodilator ratio 87, postbronchodilator FEV1 2.21 (99% predicted), DLCO 14.02 (79% addicted)     Assessment/Plan: Encounter Diagnoses  Name Primary?   Chronic  GERD    Chronic cough Yes   OSA on CPAP    Chronic sinusitis, unspecified location    Environmental and seasonal allergies    Post-nasal drip    Chronic nasal congestion     Assessment and Plan    Chronic Cough Chronic cough for years, sometimes productive with green or white mucus. No recent sinus infections. History of nasal polyp removal 32 years ago. Recent productive cough around Christmas treated with Mucinex. Possible postnasal drainage and silent reflux contributing to symptoms. Discussed potential benefits of a CT scan to evaluate for chronic sinus inflammation and managing postnasal drainage and swelling. If CT scan is clear, focus will shift to controlling postnasal drainage and nasal swelling. 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 Currently taking Xyzal daily. Examination shows clear secretions and mucosal edema, no pus or polyps. She had surgical changes on the left (has hx of sinus surgery for nasal polyps). Discussed benefits of starting Flonase and daily nasal irrigation to manage symptoms. - Continue Xyzal 5 mg daily - Start Flonase twice a day 2 puffs b/l nares - Recommend daily nasal irrigation with saline  Chronic Gastroesophageal Reflux Disease (GERD) Heartburn and reflux, currently taking Nexium 40 mg daily. Examination shows changes around the voice box consistent with silent reflux. Discussed potential benefits of a reflux supplement to reduce symptoms and  improve cough. - Continue Nexium 40 mg daily - Consider reflux supplement (seaweed-based paste) Reflux Gourmet after meals - diet and lifestyle changes to minimize GERD  Temporomandibular Joint (TMJ) Disorder Ear pain without evidence of ear infection or air fluid level on exam. Likely TMJ-related pain radiating to the ear as she was tender on exam when TMJ was palpated - instructions given for TMJ sy   Follow-up - Schedule CT scan of sinuses - Follow-up appointment in a couple of months - Continue seeing Dr. Marchelle Gearing for asthma-related care.        Thank you for allowing me to participate in the care of this patient. Please do not hesitate to contact me with any questions or concerns.   Ashok Croon, MD Otolaryngology [facility] Phone: [phone] Fax: [phone]    12/02/2023, 1:32 PM

## 2023-12-08 ENCOUNTER — Other Ambulatory Visit: Payer: Self-pay | Admitting: Obstetrics and Gynecology

## 2023-12-08 DIAGNOSIS — Z1231 Encounter for screening mammogram for malignant neoplasm of breast: Secondary | ICD-10-CM

## 2023-12-13 ENCOUNTER — Encounter: Payer: Self-pay | Admitting: Gastroenterology

## 2023-12-14 ENCOUNTER — Ambulatory Visit (HOSPITAL_COMMUNITY)
Admission: RE | Admit: 2023-12-14 | Discharge: 2023-12-14 | Disposition: A | Discharge status: Home / Self Care | Payer: BC Managed Care – PPO | Source: Ambulatory Visit | Attending: Otolaryngology | Admitting: Otolaryngology

## 2023-12-14 DIAGNOSIS — J329 Chronic sinusitis, unspecified: Secondary | ICD-10-CM | POA: Insufficient documentation

## 2023-12-22 ENCOUNTER — Ambulatory Visit: Payer: BC Managed Care – PPO

## 2023-12-27 ENCOUNTER — Encounter: Payer: Self-pay | Admitting: Internal Medicine

## 2024-01-06 ENCOUNTER — Ambulatory Visit: Payer: BC Managed Care – PPO | Admitting: Internal Medicine

## 2024-01-09 ENCOUNTER — Ambulatory Visit
Admission: RE | Admit: 2024-01-09 | Discharge: 2024-01-09 | Disposition: A | Discharge status: Home / Self Care | Payer: BC Managed Care – PPO | Source: Ambulatory Visit | Attending: Obstetrics and Gynecology | Admitting: Obstetrics and Gynecology

## 2024-01-09 ENCOUNTER — Other Ambulatory Visit: Payer: Self-pay | Admitting: Obstetrics and Gynecology

## 2024-01-09 DIAGNOSIS — Z1231 Encounter for screening mammogram for malignant neoplasm of breast: Secondary | ICD-10-CM

## 2024-01-09 DIAGNOSIS — N63 Unspecified lump in unspecified breast: Secondary | ICD-10-CM

## 2024-01-17 ENCOUNTER — Ambulatory Visit: Payer: BC Managed Care – PPO | Admitting: Internal Medicine

## 2024-01-18 ENCOUNTER — Encounter (INDEPENDENT_AMBULATORY_CARE_PROVIDER_SITE_OTHER): Payer: Self-pay | Admitting: Otolaryngology

## 2024-01-18 ENCOUNTER — Ambulatory Visit (INDEPENDENT_AMBULATORY_CARE_PROVIDER_SITE_OTHER): Admitting: Otolaryngology

## 2024-01-18 DIAGNOSIS — R0981 Nasal congestion: Secondary | ICD-10-CM

## 2024-01-18 DIAGNOSIS — R053 Chronic cough: Secondary | ICD-10-CM | POA: Diagnosis not present

## 2024-01-18 DIAGNOSIS — K219 Gastro-esophageal reflux disease without esophagitis: Secondary | ICD-10-CM | POA: Diagnosis not present

## 2024-01-18 DIAGNOSIS — J3089 Other allergic rhinitis: Secondary | ICD-10-CM

## 2024-01-18 DIAGNOSIS — G4733 Obstructive sleep apnea (adult) (pediatric): Secondary | ICD-10-CM

## 2024-01-18 DIAGNOSIS — R0982 Postnasal drip: Secondary | ICD-10-CM

## 2024-01-18 NOTE — Patient Instructions (Signed)
-   start steroid nasal rinses with [employer]Med Bottle  - continue Xyzal and reflux medication    [email or web address] - check out this website to learn more about reflux   -Avoid lying down for at least two hours after a meal or after drinking acidic beverages, like soda, or other caffeinated beverages. This can help to prevent stomach contents from flowing back into the esophagus. -Keep your head elevated while you sleep. Using an extra pillow or two can also help to prevent reflux. -Eat smaller and more frequent meals each day instead of a few large meals. This promotes digestion and can aid in preventing heartburn. -Wear loose-fitting clothes to ease pressure on the stomach, which can worsen heartburn and reflux. -Reduce excess weight around the midsection. This can ease pressure on the stomach. Such pressure can force some stomach contents back up the esophagus   - Take Reflux Gourmet (natural supplement available on Amazon) to help with symptoms of chronic throat irritation

## 2024-01-18 NOTE — Progress Notes (Signed)
 ENT Progress Note:   Update 01/18/2024  Discussed the use of AI scribe software for clinical note transcription with the patient, who gave verbal consent to proceed.  She returns for follow-up after CT max face.  Reports no significant changes in her symptoms.  She is currently using Flonase and takes daily Xyzal.  Here to discuss results of imaging.  She continues to cough although her cough has not been productive lately.  On reflux medication and started reflux Gourmet.  Records Reviewed:  Initial Evaluation  Reason for Consult: chronic cough    HPI: Discussed the use of AI scribe software for clinical note transcription with the patient, who gave verbal consent to proceed.  History of Present Illness   The patient is a 31 yoF who presents with chronic cough. She was referred by Dr. Colletta Hancock for evaluation of her chronic cough.  She has experienced a chronic cough since her forties, which can be both dry and productive. The mucus is often green, white, or clear. The last productive cough episode occurred around Christmas, approximately two months ago, for which she took Mucinex. She denies recent sinus scans but has a history of sinus surgery for nasal polyp removal about 32 years ago.  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 She takes Xyzal daily at night for allergies. She was previously on Singulair but discontinued it due to a black box warning, although she keeps it for use during cold season. She does not currently use nasal sprays.  She experiences heartburn and takes Nexium 40 mg daily. She also reports ear pain, particularly in the right ear, and a history of fluid in her ears. Her ears hurt all the time, with the right ear bothering her this week.  She describes experiencing dry mouth and notes that her sense of smell is generally good. She has a history of secondhand smoke exposure as a child but has never been a smoker herself. She uses  albuterol and Symbicort inhalers but had a vision-related reaction to a powdered inhaler in the past.       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  Self-referred for shortness of breath and cough.  She tells me that she has had a lifelong history of asthma for which she is on Symbicort and Singulair on a scheduled basis.  Then in 2018 Easter she had influenza treated with Tamiflu as an outpatient in [facility] after family cluster.  Since then she has had cough that has persisted all along.  The cough is mild and persistent.  Never really improved.  Then in early 2020 she developed insidious onset of shortness of breath that has progressed.  She tells me she can get short of breath anytime.  At night she can wake up occasionally because of the cough but never because of shortness of breath.  There is no orthopnea proximal nocturnal dyspnea but in the daytime she perceives dyspnea for anything and randomly.  She says that when she walks definitely the shortness of breath is worse and relieved by rest there is no associated chest pain.  She has an upcoming echocardiogram.  Review of the outside notes indicate she has had cardiology work-up that has been negative.  Overall the shortness of breath is rated currently as moderate with the cough as mild and the wheezing is  extremely mild.   Walking desaturation test today on room air shows pulse ox 100% on room air with a heart rate of 88/min.  After walking 3 laps her pulse ox was 98% with a heart rate of 107/min.  She walked at average pace and she was mildly dyspneic with this.  Review of the chart indicates a history of right lower lobe lung nodule.  However there is no imaging for me to visualize.  She said rheumatoid factor test for unclear reasons for years ago and was normal   She has a history of  allergies.  She says she has had a skin test many many years ago and this was positive for oak.  Otherwise details not known.  She is willing to have a full work-up at this point in time.     In terms of asthma control questionnaire she says she is waking up several times at night because of asthma.  When she wakes up she has moderate symptoms she is moderately limited in activities.  She is experiencing shortness of breath quite a lot but she is wheezing hardly any of the time.  Overall score average score would be 2.8 showing significant level of symptoms.  Catherine Hancock 63 y.o. -here to review results of below work-up. In the interim she says her symptoms persist but the cough and the shortness of breath. Without any change. I she again insisted the shortness of breath happens randomly and she has to take a deep breath. She also has left inframammary tightness that is chronic. She had a recent echocardiogram that is normal. Allergy work-up and autoimmune is normal. Pulmonary function test not done yet because of the pandemic and disruptions in the PFT lab. CT scan of the chest personally visualized and interpreted is essentially normal except for thickened airways of chronic bronchitis. She is compliant with the Symbicort. She takes albuterol as needed. She has been taking Symbicort for a few years. She also has a cough but not resolved. She has not seen ENT. She used to see Dr. Jearld Hancock many years ago. She is not had pulmonary stress test.   At last evaluation by Dr. Marchelle Hancock she was referred to ENT for chronic cough irritable larynx syndrome as well as for a cardiopulmonary exercise test back in March/2021.  Unclear if is asthma or something else going on such as acid reflux. If asthma, then need to figure out step up treatment options     Plan - - Do CBC with differential, blood IgE - Do HRCT supine and prone ( 18 months ago normal lungs on CT)  - Refer [facility] - do full PFT - refer pH Probe  study with Dr Catherine Hancock --Continue Symbicort and Singulair as before -Continue 2 L nasal cannula with CPAP at night   Follow-up - Return to see Catherine Hancock in 8-12 weeks but after completing above             - if asthma then consider empiric tezpire     FOLLOWUP Return in about 12 weeks (around 01/04/2024) for 15 min visit, with Dr Catherine Hancock, Face to Face Visit.    Past Medical History:  Diagnosis Date   Asthma    Asthma    Chest pain    LHC 7/08: EF 60%, normal coronaries; ETT-Echo 6/11: EF 55-60%, no ischemia   Chronic back pain    CTS (carpal tunnel syndrome)    bil   CTS (carpal tunnel syndrome)    Depression  05/28/2020   Hypothyroidism    IBS (irritable bowel syndrome)    Internal hemorrhoids    Migraines    OSA on CPAP 11/13/2017   Mild with AHI 13/hr    Pulmonary nodule, right     Past Surgical History:  Procedure Laterality Date   BREAST EXCISIONAL BIOPSY Right    benign   breast nodule surg     right breast nodule and left lymph node removed in 1994   COLONOSCOPY  06/09/2012   Procedure: COLONOSCOPY;  Surgeon: Hart Carwin, MD;  Location: [facility];  Service: Endoscopy;  Laterality: N/A;   EXCISION OF BREAST BIOPSY Left    2 benign lymph nodes removed   HEMORRHOID SURGERY     2007   LYMPH NODE BIOPSY     left breast   NASAL SINUS SURGERY     1992   TONSILLECTOMY     1981   TOTAL VAGINAL HYSTERECTOMY      Family History  Problem Relation Age of Onset   Colon cancer Maternal Aunt        x 4   Colon cancer Maternal Uncle    Colon cancer Maternal Grandmother    Heart disease Maternal Grandfather    Breast cancer Maternal Aunt    Ovarian cancer Maternal Aunt    Lymphoma Mother    Prostate cancer Father    COPD Father    Heart failure Father    Hypertension Father    Heart disease Paternal Uncle        x 4   Heart disease Paternal Grandfather    Mitral valve prolapse Sister     Social History:  reports that she has never smoked. She has never  used smokeless tobacco. She reports that she does not drink alcohol and does not use drugs.  Allergies:  Allergies  Allergen Reactions   Gadolinium Derivatives Other (See Comments)    Severe pain, joints locked up   Latex Other (See Comments) and Shortness Of Breath    Asthma attack    Other Shortness Of Breath and Other (See Comments)    Severe pain, joints locked up gadolinium contrast-causes joints to lock up, severe pain Severe pain, joints locked up gadolinium contrast-causes joints to lock up, severe pain  Asthma attack   Fluticasone-Salmeterol Other (See Comments)    Blurry vision Blurry vision Blurry vision Blurry vision Blurry vision   Iodinated Contrast Media Other (See Comments)    gadolinium contrast-causes joints to lock up, severe pain   Doxycycline Itching and Rash   Sulfamethoxazole-Trimethoprim Diarrhea and Nausea And Vomiting    REACTION: GI upset Other reaction(s): OTHER REACTION: GI upset Other reaction(s): OTHER Other reaction(s): OTHER    Medications: I have reviewed the patient's current medications.  The PMH, PSH, Medications, Allergies, and SH were reviewed and updated.  ROS: Constitutional: Negative for fever, weight loss and weight gain. Cardiovascular: Negative for chest pain and dyspnea on exertion. Respiratory: Is not experiencing shortness of breath at rest. Gastrointestinal: Negative for nausea and vomiting. Neurological: Negative for headaches. Psychiatric: The patient is not nervous/anxious  There were no vitals taken for this visit.  PHYSICAL EXAM:  Exam: General: Well-developed, well-nourished Respiratory Respiratory effort: Equal inspiration and expiration without stridor Cardiovascular Peripheral Vascular: Warm extremities with equal color/perfusion Eyes: No nystagmus with equal extraocular motion bilaterally Neuro/Psych/Balance: Patient oriented to person, place, and time; Appropriate mood and affect; Gait is intact with  no imbalance; Cranial nerves I-XII are intact Head and Face  Inspection: Normocephalic and atraumatic without mass or lesion Palpation: Facial skeleton intact without bony stepoffs Salivary Glands: No mass or tenderness Facial Strength: Facial motility symmetric and full bilaterally ENT Pinna: External ear intact and fully developed External Nose: No scar or anatomic deformity Neck Neck and Trachea: Midline trachea without mass or lesion Thyroid: No mass or nodularity Lymphatics: No lymphadenopathy  Procedure: none  Studies Reviewed: CT chest CT chest high resolution   IMPRESSION: 1. No evidence of interstitial lung disease. 2. Nonspecific mild diffuse bronchial wall thickening, as can be seen with reactive airways disease or chronic bronchitis. 3. Small hiatal hernia. 4. Cholelithiasis.  09/19/2020-pulmonary function test-FVC 2.64 (92% addicted), TLC 3.28 (73% percent predicted), postbronchodilator ratio 87, postbronchodilator FEV1 2.21 (99% predicted), DLCO 14.02 (79% addicted)   CT sinus 01/17/24 Narrative & Impression    COMPARISON:  Brain MRI 01/22/2015   FINDINGS: Paranasal sinuses:   Frontal: Left frontal sinus is clear. There is mucosal thickening at the right frontal ethmoid junction.   Ethmoid: Mucosal thickening in both ethmoid sinus regions. Previous ethmoidectomy surgeries.   Maxillary: Mucosal thickening on both sides with some layering fluid on the left. Previous FESS with patent airspace communication on the left but not visible on the right.   Sphenoid: Mucosal thickening with a small amount of layering fluid.   Right ostiomeatal unit: Previous FESS   Left ostiomeatal unit: Previous FESS   Nasal passages: Sufficiently patent. Intact nasal septum is midline.   Anatomy: Minor pneumatization superior to both anterior ethmoid notches. Symmetric and intact olfactory grooves and fovea ethmoidalis, Keros I (1-63mm). Sellar sphenoid  pneumatization pattern. No dehiscence of carotid or optic canals. No onodi cell.   Other: None   IMPRESSION: Previous FESS . Mucosal thickening in the right frontal ethmoid junction, both ethmoid sinus regions, both maxillary sinuses and the sphenoid sinus. Some layering fluid in the left maxillary sinus and the sphenoid sinus.     Assessment/Plan: Encounter Diagnoses  Name Primary?   Chronic GERD Yes   Chronic cough    OSA on CPAP    Post-nasal drip    Environmental and seasonal allergies    Chronic nasal congestion      Assessment and Plan    Chronic Cough Chronic cough for years, sometimes productive with green or white mucus. No recent sinus infections. History of nasal polyp removal 32 years ago. Recent productive cough around Christmas treated with Mucinex. Possible postnasal drainage and silent reflux contributing to symptoms. Discussed potential benefits of a CT scan to evaluate for chronic sinus inflammation and managing postnasal drainage and swelling. If CT scan is clear, focus will shift to controlling postnasal drainage and nasal swelling. 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 Currently taking Xyzal daily. Examination shows clear secretions and mucosal edema, no pus or polyps. She had surgical changes on the left (has hx of sinus surgery for nasal polyps). Discussed benefits of starting Flonase and daily nasal irrigation to manage symptoms. - Continue Xyzal 5 mg daily - Start Flonase twice a day 2 puffs b/l nares - Recommend daily nasal irrigation with saline  Chronic Gastroesophageal Reflux Disease (GERD) Heartburn and reflux, currently taking Nexium 40 mg daily. Examination shows changes around the voice box consistent with silent reflux. Discussed potential benefits of a reflux supplement  to reduce symptoms and improve cough. -  Continue Nexium 40 mg daily - Consider reflux supplement (seaweed-based paste) Reflux Gourmet after meals - diet and lifestyle changes to minimize GERD  Temporomandibular Joint (TMJ) Disorder Ear pain without evidence of ear infection or air fluid level on exam. Likely TMJ-related pain radiating to the ear as she was tender on exam when TMJ was palpated - instructions given for TMJ sy   Follow-up - Schedule CT scan of sinuses - Follow-up appointment in a couple of months - Continue seeing Dr. Marchelle Hancock for asthma-related care.     Update 01/18/2024 Chronic Cough Chronic cough for years, sometimes productive with green or white mucus. No recent sinus infections. History of nasal polyp removal 32 years ago. Recent productive cough around Christmas treated with Mucinex. Possible postnasal drainage and silent reflux contributing to symptoms. CT sinuses without evidence of significant chronic sinus inflammation, with only trace mucus along the floor of maxillary sinuses and along residual ethmoid partitions.  Sinus drainage pathways appear patent.  We discussed that focus should be on management of allergies and postnasal drainage, as well as management of reflux.  We also discussed that her cough could be neurogenic in nature although it could also be related to her asthma diagnosis.  Will do a trial of nasal steroid rinses.  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 Currently taking Xyzal daily. Nasal endoscopy in the past showed clear secretions and mucosal edema, no pus or polyps. She had surgical changes on the left (has hx of sinus surgery for nasal polyps) - Continue Xyzal 5 mg daily -Stop Flonase and start nasal steroid rinses with NeilMed bottle and budesonide  Chronic Gastroesophageal Reflux Disease  (GERD) Heartburn and reflux, currently taking Nexium 40 mg daily. Examination shows changes around the voice box consistent with silent reflux. Discussed potential benefits of a reflux supplement to reduce symptoms and improve cough. - Continue Nexium 40 mg daily - Continue Reflux Gourmet after meals - diet and lifestyle changes to minimize GERD     Ashok Croon, MD Otolaryngology [facility] Phone: [phone] Fax: [phone]    01/18/2024, 3:12 PM

## 2024-01-27 ENCOUNTER — Ambulatory Visit (INDEPENDENT_AMBULATORY_CARE_PROVIDER_SITE_OTHER): Payer: BC Managed Care – PPO | Admitting: Otolaryngology

## 2024-02-17 ENCOUNTER — Other Ambulatory Visit

## 2024-02-17 ENCOUNTER — Encounter

## 2024-02-29 ENCOUNTER — Ambulatory Visit: Payer: BC Managed Care – PPO | Admitting: Gastroenterology

## 2024-03-08 ENCOUNTER — Other Ambulatory Visit: Payer: Self-pay | Admitting: Obstetrics and Gynecology

## 2024-03-08 ENCOUNTER — Ambulatory Visit
Admission: RE | Admit: 2024-03-08 | Discharge: 2024-03-08 | Disposition: A | Discharge status: Home / Self Care | Source: Ambulatory Visit | Attending: Obstetrics and Gynecology | Admitting: Obstetrics and Gynecology

## 2024-03-08 DIAGNOSIS — N63 Unspecified lump in unspecified breast: Secondary | ICD-10-CM

## 2024-03-08 DIAGNOSIS — N6320 Unspecified lump in the left breast, unspecified quadrant: Secondary | ICD-10-CM

## 2024-03-09 ENCOUNTER — Ambulatory Visit
Admission: RE | Admit: 2024-03-09 | Discharge: 2024-03-09 | Disposition: A | Discharge status: Home / Self Care | Source: Ambulatory Visit | Attending: Obstetrics and Gynecology | Admitting: Obstetrics and Gynecology

## 2024-03-09 ENCOUNTER — Other Ambulatory Visit: Payer: Self-pay | Admitting: Obstetrics and Gynecology

## 2024-03-09 DIAGNOSIS — N6320 Unspecified lump in the left breast, unspecified quadrant: Secondary | ICD-10-CM

## 2024-03-09 DIAGNOSIS — N63 Unspecified lump in unspecified breast: Secondary | ICD-10-CM

## 2024-03-29 ENCOUNTER — Ambulatory Visit: Admitting: Urology

## 2024-03-30 NOTE — Progress Notes (Deleted)
 Name: Catherine Hancock DOB: [birth year] MRN: [id number]  History of Present Illness: Catherine Hancock is a 63 y.o. female who presents today as a new patient at [facility].  ***She is accompanied by ***. GU History includes: 1. ***.  She reports chief complaint of microscopic hematuria. > 02/14/2024: - Urine microscopy: 10 WBC/hpf, 5 RBC/hpf, bacteria (few), calcium oxalate crystals present - No urine culture  > ***: CT ***  Today: She {Actions; denies-reports:120008} increased urinary urgency, frequency, nocturia, dysuria, gross hematuria, hesitancy, straining to void, or sensations of incomplete emptying. She {Actions; denies-reports:120008} flank pain or abdominal pain. She {Actions; denies-reports:120008} fevers, nausea, or vomiting.  She {Actions; denies-reports:120008} prior history of gross hematuria.  She {Actions; denies-reports:120008} history of kidney stones.  She {Actions; denies-reports:120008} history of pyelonephritis.  She {Actions; denies-reports:120008} history of recent or recurrent UTI. She {Actions; denies-reports:120008} history of GU malignancy or pelvic radiation.  She {Actions; denies-reports:120008} history of autoimmune disease. She {Actions; denies-reports:120008} history of sickle cell disease or sickle cell trait. She ***denies history of smoking. She {Actions; denies-reports:120008} known occupational risks. She {Actions; denies-reports:120008} recent vigorous exercise which they think may be contributory to hematuria. She {Actions; denies-reports:120008} any recent trauma or prolonged pressure to the perineal area. She {Actions; denies-reports:120008} recent illness. She {Actions; denies-reports:120008} taking anticoagulants (***Aspirin  81 mg ***Coumadin ***Xarelto ***Eliquis ***Plavix).  Medications: Current Outpatient Medications  Medication Sig Dispense Refill   albuterol  (ACCUNEB ) 0.63 MG/3ML nebulizer solution Take 1 ampule by  nebulization every 6 (six) hours as needed for wheezing or shortness of breath.     albuterol  (VENTOLIN  HFA) 108 (90 Base) MCG/ACT inhaler Inhale 2 puffs into the lungs every 6 (six) hours. 8 g 0   ALPRAZolam  (XANAX ) 0.5 MG tablet Take 0.25-0.5 mg by mouth 2 (two) times daily as needed for anxiety. PRN     amLODipine (NORVASC) 5 MG tablet Take 5 mg by mouth daily.     aspirin  EC 81 MG tablet Take 1 tablet (81 mg total) by mouth daily.     BIOTIN PO Take 1 tablet by mouth daily. (Patient not taking: Reported on 10/18/2023)     budesonide -formoterol  (SYMBICORT ) 160-4.5 MCG/ACT inhaler Inhale 2 puffs into the lungs 2 (two) times daily.     Calcium Carb-Cholecalciferol (CALCIUM 600+D3) 600-800 MG-UNIT TABS Take 1 tablet by mouth daily.     citalopram  (CELEXA ) 20 MG tablet Take 20 mg by mouth at bedtime.     cyclobenzaprine  (FLEXERIL ) 10 MG tablet TAKE 1 TABLET THREE TIMES A DAY AS NEEDED FOR MUSCLE SPASMS (Patient taking differently: Take 10 mg by mouth at bedtime.) 90 tablet 0   diclofenac (VOLTAREN) 50 MG EC tablet Take 50 mg by mouth 2 (two) times daily as needed (hip pain).     DONNATAL  16.2 MG tablet Take 16.2 mg by mouth daily as needed (Stomach pain).      EPINEPHrine 0.3 mg/0.3 mL IJ SOAJ injection Inject 0.3 mg into the muscle as needed for anaphylaxis.      esomeprazole (NEXIUM) 40 MG capsule Take 40 mg by mouth 2 (two) times daily before a meal.      fluticasone  (FLONASE ) 50 MCG/ACT nasal spray Place 2 sprays into both nostrils daily. 16 g 6   HYDROcodone -acetaminophen  (NORCO/VICODIN) 5-325 MG tablet Take 1 tablet by mouth every 6 (six) hours as needed for moderate pain.     ibuprofen  (ADVIL ,MOTRIN ) 800 MG tablet Take 800 mg by mouth every 8 (eight) hours as needed (pain).  levocetirizine (XYZAL) 5 MG tablet Take 1 tablet by mouth every evening.     levothyroxine  (SYNTHROID ) 50 MCG tablet Take 50 mcg by mouth daily before breakfast.     lubiprostone (AMITIZA) 24 MCG capsule Take 24 mcg  by mouth 2 (two) times daily.     montelukast  (SINGULAIR ) 10 MG tablet Take 10 mg by mouth at bedtime.     nitroGLYCERIN  (NITROSTAT ) 0.4 MG SL tablet Place 1 tablet (0.4 mg total) under the tongue every 5 (five) minutes as needed for chest pain. 25 tablet 12   polyethylene glycol powder (GLYCOLAX /MIRALAX ) powder Take 17 g by mouth as needed for mild constipation.      Prenatal Vit-Fe Fumarate-FA (M-NATAL PLUS) 27-1 MG TABS Take 1 tablet by mouth at bedtime.     SEMAGLUTIDE PO Take by mouth. As directed     Vitamin D, Ergocalciferol, (DRISDOL) 1.25 MG (50000 UNIT) CAPS capsule Take 50,000 Units by mouth every Monday.     zinc  gluconate 50 MG tablet Take 50 mg by mouth daily.     zolpidem  (AMBIEN ) 10 MG tablet Take 5 mg by mouth at bedtime as needed for sleep.     No current facility-administered medications for this visit.    Allergies: Allergies  Allergen Reactions   Gadolinium Derivatives Other (See Comments)    Severe pain, joints locked up   Latex Other (See Comments) and Shortness Of Breath    Asthma attack    Other Shortness Of Breath and Other (See Comments)    Severe pain, joints locked up gadolinium contrast-causes joints to lock up, severe pain Severe pain, joints locked up gadolinium contrast-causes joints to lock up, severe pain  Asthma attack   Fluticasone -Salmeterol Other (See Comments)    Blurry vision Blurry vision Blurry vision Blurry vision Blurry vision   Iodinated Contrast Media Other (See Comments)    gadolinium contrast-causes joints to lock up, severe pain   Doxycycline Itching and Rash   Sulfamethoxazole-Trimethoprim Diarrhea and Nausea And Vomiting    REACTION: GI upset Other reaction(s): OTHER REACTION: GI upset Other reaction(s): OTHER Other reaction(s): OTHER    Past Medical History:  Diagnosis Date   Asthma    Asthma    Chest pain    LHC 7/08: EF 60%, normal coronaries; ETT-Echo 6/11: EF 55-60%, no ischemia   Chronic back pain    CTS  (carpal tunnel syndrome)    bil   CTS (carpal tunnel syndrome)    Depression 05/28/2020   Hypothyroidism    IBS (irritable bowel syndrome)    Internal hemorrhoids    Migraines    OSA on CPAP 11/13/2017   Mild with AHI 13/hr    Pulmonary nodule, right    Past Surgical History:  Procedure Laterality Date   BREAST EXCISIONAL BIOPSY Right    benign   breast nodule surg     right breast nodule and left lymph node removed in 1994   COLONOSCOPY  06/09/2012   Procedure: COLONOSCOPY;  Surgeon: Pietro Bridegroom, MD;  Location: [facility];  Service: Endoscopy;  Laterality: N/A;   EXCISION OF BREAST BIOPSY Left    2 benign lymph nodes removed   HEMORRHOID SURGERY     2007   LYMPH NODE BIOPSY     left breast   NASAL SINUS SURGERY     1992   TONSILLECTOMY     1981   TOTAL VAGINAL HYSTERECTOMY     Family History  Problem Relation Age of Onset   Colon  cancer Maternal Aunt        x 4   Colon cancer Maternal Uncle    Colon cancer Maternal Grandmother    Heart disease Maternal Grandfather    Breast cancer Maternal Aunt    Ovarian cancer Maternal Aunt    Lymphoma Mother    Prostate cancer Father    COPD Father    Heart failure Father    Hypertension Father    Heart disease Paternal Uncle        x 4   Heart disease Paternal Grandfather    Mitral valve prolapse Sister    Social History   Socioeconomic History   Marital status: Married    Spouse name: Evan Hillock   Number of children: 3   Years of education: 12   Highest education level: Not on file  Occupational History   Not on file  Tobacco Use   Smoking status: Never   Smokeless tobacco: Never  Vaping Use   Vaping status: Never Used  Substance and Sexual Activity   Alcohol use: No   Drug use: No   Sexual activity: Yes    Partners: Male  Other Topics Concern   Not on file  Social History Narrative   Live at home with husband and son.   Right handed.   Caffeine use: drinks 1 pepsi/day    Social Drivers of Manufacturing engineer Strain: Not on file  Food Insecurity: Not on file  Transportation Needs: Not on file  Physical Activity: Not on file  Stress: Not on file  Social Connections: Not on file  Intimate Partner Violence: Not on file    SUBJECTIVE  Review of Systems*** Constitutional: Patient denies any unintentional weight loss or change in strength lntegumentary: Patient denies any rashes or pruritus Cardiovascular: Patient denies chest pain or syncope Respiratory: Patient denies shortness of breath Gastrointestinal: ***Patient {Actions; denies-reports:120008} ***nausea, ***vomiting, ***constipation, ***diarrhea ***As per HPI Musculoskeletal: Patient denies muscle cramps or weakness Neurologic: Patient denies convulsions or seizures Allergic/Immunologic: Patient denies recent allergic reaction(s) Hematologic/Lymphatic: Patient denies bleeding tendencies Endocrine: Patient denies heat/cold intolerance  GU: As per HPI.  OBJECTIVE There were no vitals filed for this visit. There is no height or weight on file to calculate BMI.  Physical Examination Constitutional: No obvious distress; patient is non-toxic appearing  Cardiovascular: No visible lower extremity edema.  Respiratory: The patient does not have audible wheezing/stridor; respirations do not appear labored  Gastrointestinal: Abdomen non-distended Musculoskeletal: Normal ROM of UEs  Skin: No obvious rashes/open sores  Neurologic: CN 2-12 grossly intact Psychiatric: Answered questions appropriately with normal affect  Hematologic/Lymphatic/Immunologic: No obvious bruises or sites of spontaneous bleeding  UA: ***negative ***positive for *** leukocytes, *** blood, ***nitrites Urine microscopy: *** WBC/hpf, *** RBC/hpf, *** bacteria ***otherwise unremarkable ***glucosuria (secondary to ***Jardiance ***Farxiga use)  PVR: *** ml  ASSESSMENT No diagnosis found.  For asymptomatic microscopic hematuria we discussed  possible etiologies including but not limited to: vigorous exercise, sexual activity, stone, trauma, blood thinner use, urinary tract infection, urethral irritation secondary to ***vaginal atrophy, chronic kidney disease, glomerulonephropathy, ***BPH, ***radiation cystitis, malignancy. ***We discussed pt's nicotine use as a risk factor for GU cancer and encouraged ***continued cessation.***  We reviewed the AUA 2020 AMH guideline and risk stratification for this patient. Based on individual risk factors, pt was advised that the recommended workup includes ***repeat UA in 6 months ***RUS ***CT urogram ***cystoscopy. Pt decided to pursue this work-up and follow-up afterward to discuss the results and formulate  a treatment plan based on the findings. All questions were answered.  *** AUA 2020 AMH guideline     PLAN Advised the following: ***RUS ***CT ordered. 2. ***No follow-ups on file.  No orders of the defined types were placed in this encounter.   It has been explained that the patient is to follow regularly with their PCP in addition to all other providers involved in their care and to follow instructions provided by these respective offices. Patient advised to contact urology clinic if any urologic-pertaining questions, concerns, new symptoms or problems arise in the interim period.  There are no Patient Instructions on file for this visit.  Electronically signed by:  Lauretta Ponto, MSN, FNP-C, CUNP 03/30/2024 2:36 PM

## 2024-04-02 ENCOUNTER — Ambulatory Visit: Admitting: Urology

## 2024-04-02 DIAGNOSIS — R3129 Other microscopic hematuria: Secondary | ICD-10-CM

## 2024-04-11 ENCOUNTER — Telehealth: Payer: Self-pay

## 2024-04-11 NOTE — Telephone Encounter (Signed)
 [facility] ,  [place], [place]  Lm to get Cpap info gave them back fax #

## 2024-04-12 ENCOUNTER — Ambulatory Visit: Admitting: Internal Medicine

## 2024-04-12 ENCOUNTER — Encounter: Payer: Self-pay | Admitting: Internal Medicine

## 2024-04-12 ENCOUNTER — Ambulatory Visit

## 2024-04-12 ENCOUNTER — Telehealth: Payer: Self-pay | Admitting: Internal Medicine

## 2024-04-12 VITALS — BP 122/84 | HR 94 | Ht 60.0 in | Wt 147.0 lb

## 2024-04-12 DIAGNOSIS — R0789 Other chest pain: Secondary | ICD-10-CM

## 2024-04-12 DIAGNOSIS — R079 Chest pain, unspecified: Secondary | ICD-10-CM

## 2024-04-12 DIAGNOSIS — R0600 Dyspnea, unspecified: Secondary | ICD-10-CM

## 2024-04-12 DIAGNOSIS — R0609 Other forms of dyspnea: Secondary | ICD-10-CM | POA: Diagnosis not present

## 2024-04-12 DIAGNOSIS — J45909 Unspecified asthma, uncomplicated: Secondary | ICD-10-CM

## 2024-04-12 DIAGNOSIS — K449 Diaphragmatic hernia without obstruction or gangrene: Secondary | ICD-10-CM

## 2024-04-12 DIAGNOSIS — R053 Chronic cough: Secondary | ICD-10-CM | POA: Diagnosis not present

## 2024-04-12 LAB — PULMONARY FUNCTION TEST
DL/VA % pred: 95 %
DL/VA: 4.13 ml/min/mmHg/L
DLCO cor % pred: 92 %
DLCO cor: 16.05 ml/min/mmHg
DLCO unc % pred: 92 %
DLCO unc: 16.05 ml/min/mmHg
FEF 25-75 Post: 2.73 L/s
FEF 25-75 Pre: 2.5 L/s
FEF2575-%Change-Post: 9 %
FEF2575-%Pred-Post: 136 %
FEF2575-%Pred-Pre: 125 %
FEV1-%Change-Post: 1 %
FEV1-%Pred-Post: 116 %
FEV1-%Pred-Pre: 113 %
FEV1-Post: 2.44 L
FEV1-Pre: 2.4 L
FEV1FVC-%Change-Post: 4 %
FEV1FVC-%Pred-Pre: 104 %
FEV6-%Change-Post: -2 %
FEV6-%Pred-Post: 109 %
FEV6-%Pred-Pre: 112 %
FEV6-Post: 2.88 L
FEV6-Pre: 2.96 L
FEV6FVC-%Pred-Post: 104 %
FEV6FVC-%Pred-Pre: 104 %
FVC-%Change-Post: -2 %
FVC-%Pred-Post: 104 %
FVC-%Pred-Pre: 107 %
FVC-Post: 2.88 L
FVC-Pre: 2.96 L
Post FEV1/FVC ratio: 85 %
Post FEV6/FVC ratio: 100 %
Pre FEV1/FVC ratio: 81 %
Pre FEV6/FVC Ratio: 100 %
RV % pred: 69 %
RV: 1.28 L
TLC % pred: 91 %
TLC: 4.08 L

## 2024-04-12 NOTE — Progress Notes (Signed)
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 reaction(s): OTHER    Immunization History  Administered Date(s) Administered   Influenza Whole 07/25/2007   Influenza,inj,Quad PF,6+ Mos 08/13/2016, 09/12/2019, 07/31/2021   Influenza-Unspecified 07/29/2017, 09/02/2020   Pneumococcal Conjugate-13 10/11/2013, 10/11/2013   Pneumococcal Polysaccharide-23 06/14/2017   Tdap 05/13/2011, 08/13/2016, 02/06/2021   Zoster Recombinant(Shingrix) 02/06/2021   Zoster, Live 10/30/2012, 04/27/2021    Family History  Problem Relation Age of Onset   Colon cancer Maternal Aunt        x 4   Colon cancer Maternal Uncle    Colon cancer Maternal Grandmother    Heart disease Maternal Grandfather    Breast cancer Maternal Aunt    Ovarian cancer Maternal Aunt    Lymphoma Mother    Prostate cancer Father    COPD  Father    Heart failure Father    Hypertension Father    Heart disease Paternal Uncle        x 4   Heart disease Paternal Grandfather    Mitral valve prolapse Sister      Current Outpatient Medications:    albuterol  (ACCUNEB ) 0.63 MG/3ML nebulizer solution, Take 1 ampule by nebulization every 6 (six) hours as needed for wheezing or shortness of breath., Disp: , Rfl:    albuterol  (VENTOLIN  HFA) 108 (90 Base) MCG/ACT inhaler, Inhale 2 puffs into the lungs every 6 (six) hours., Disp: 8 g, Rfl: 0   ALPRAZolam  (XANAX ) 0.5 MG tablet, Take 0.25-0.5 mg by mouth 2 (two) times daily as needed for anxiety. PRN, Disp: , Rfl:    amLODipine (NORVASC) 5 MG tablet, Take 5 mg by mouth daily., Disp: , Rfl:    aspirin  EC 81 MG tablet, Take 1 tablet (81 mg total) by mouth daily., Disp: , Rfl:    BIOTIN PO, Take 1 tablet by mouth daily. (Patient not taking: Reported on 10/18/2023), Disp: , Rfl:    budesonide -formoterol  (SYMBICORT ) 160-4.5 MCG/ACT inhaler, Inhale 2 puffs into the lungs 2 (two) times daily., Disp: , Rfl:    Calcium Carb-Cholecalciferol (CALCIUM 600+D3) 600-800 MG-UNIT TABS, Take 1 tablet by mouth daily., Disp: , Rfl:    citalopram  (CELEXA ) 20 MG tablet, Take 20 mg by mouth at bedtime., Disp: ,  Rfl:    cyclobenzaprine  (FLEXERIL ) 10 MG tablet, TAKE 1 TABLET THREE TIMES A DAY AS NEEDED FOR MUSCLE SPASMS (Patient taking differently: Take 10 mg by mouth at bedtime.), Disp: 90 tablet, Rfl: 0   diclofenac (VOLTAREN) 50 MG EC tablet, Take 50 mg by mouth 2 (two) times daily as needed (hip pain)., Disp: , Rfl:    DONNATAL  16.2 MG tablet, Take 16.2 mg by mouth daily as needed (Stomach pain). , Disp: , Rfl:    EPINEPHrine 0.3 mg/0.3 mL IJ SOAJ injection, Inject 0.3 mg into the muscle as needed for anaphylaxis. , Disp: , Rfl:    esomeprazole (NEXIUM) 40 MG capsule, Take 40 mg by mouth 2 (two) times daily before a meal. , Disp: , Rfl:    fluticasone  (FLONASE ) 50 MCG/ACT nasal spray, Place 2 sprays into both  nostrils daily., Disp: 16 g, Rfl: 6   HYDROcodone -acetaminophen  (NORCO/VICODIN) 5-325 MG tablet, Take 1 tablet by mouth every 6 (six) hours as needed for moderate pain., Disp: , Rfl:    ibuprofen  (ADVIL ,MOTRIN ) 800 MG tablet, Take 800 mg by mouth every 8 (eight) hours as needed (pain)., Disp: , Rfl:    levocetirizine (XYZAL) 5 MG tablet, Take 1 tablet by mouth every evening., Disp: , Rfl:    levothyroxine  (SYNTHROID ) 50 MCG tablet, Take 50 mcg by mouth daily before breakfast., Disp: , Rfl:    lubiprostone (AMITIZA) 24 MCG capsule, Take 24 mcg by mouth 2 (two) times daily., Disp: , Rfl:    montelukast  (SINGULAIR ) 10 MG tablet, Take 10 mg by mouth at bedtime., Disp: , Rfl:    nitroGLYCERIN  (NITROSTAT ) 0.4 MG SL tablet, Place 1 tablet (0.4 mg total) under the tongue every 5 (five) minutes as needed for chest pain., Disp: 25 tablet, Rfl: 12   polyethylene glycol powder (GLYCOLAX /MIRALAX ) powder, Take 17 g by mouth as needed for mild constipation. , Disp: , Rfl:    Prenatal Vit-Fe Fumarate-FA (M-NATAL PLUS) 27-1 MG TABS, Take 1 tablet by mouth at bedtime., Disp: , Rfl:    SEMAGLUTIDE PO, Take by mouth. As directed, Disp: , Rfl:    Vitamin D, Ergocalciferol, (DRISDOL) 1.25 MG (50000 UNIT) CAPS capsule, Take 50,000 Units by mouth every Monday., Disp: , Rfl:    zinc  gluconate 50 MG tablet, Take 50 mg by mouth daily., Disp: , Rfl:    zolpidem  (AMBIEN ) 10 MG tablet, Take 5 mg by mouth at bedtime as needed for sleep., Disp: , Rfl:       Objective:   Vitals:   04/12/24 1547  BP: 122/84  Pulse: 94  SpO2: 98%  Weight: 147 lb (66.7 kg)  Height: 5' (1.524 m)    Estimated body mass index is 28.71 kg/m as calculated from the following:   Height as of this encounter: 5' (1.524 m).   Weight as of this encounter: 147 lb (66.7 kg).  @WEIGHTCHANGE @  [employer]   04/12/24 1547  Weight: 147 lb (66.7 kg)     Physical Exam   General: No distress. Looks well O2 at rest: no Cane present:  no Sitting in wheel chair: no Frail: no Obese: no Neuro: Alert and Oriented x 3. GCS 15. Speech normal Psych: Pleasant Resp:  Barrel Chest - no.  Wheeze - no, Crackles - no, No overt respiratory distress CVS: Normal heart sounds. Murmurs - no Ext: Stigmata of Connective Tissue Disease - no HEENT: Normal upper airway. PEERL +. No post nasal drip        Assessment:  ICD-10-CM   1. Moderate asthma without complication, unspecified whether persistent  J45.909 Pulmonary function test    2. Hiatal hernia  K44.9 Pulmonary function test    3. Chronic cough  R05.3 Pulmonary function test    4. DOE (dyspnea on exertion)  R06.09 Pulmonary function test         Plan:     Patient Instructions     ICD-10-CM   1. Moderate asthma without complication, unspecified whether persistent  J45.909     2. Hiatal hernia  K44.9     3. Chronic cough  R05.3     4. DOE (dyspnea on exertion)  R06.09        # Asthma  - Stable and under control with Symbicort  and Singulair   Plan - Continue Symbicort  and Singulair  - If symptoms continue we can consider Biologic  #Hiatal hernia and chronic cough  -Stable but symptomatic  Plan - I will send a note to Dr. Bonnie Butters to see if you will benefit from a pH probe study  #Postinflammatory interstitial lung abnormalities  -Your CT scan was normal in 2009, 2013 and 2020 December.  But then in October 2021 it abruptly changed after you had COVID in August 2021.  Since then the latest scan January 2025 shows postinflammatory residual scar particular in the right lower lobe  -This course spector to be stable but over the course of many years some patients it can get worse  Plan - Do spirometry DLCO in 9 months - This requires serial monitoring   #Sleep apnea  Plan  - Per your sleep doctor continue CPAP   Follow-up - Return to see Takiyah Bohnsack in 9 months or to spirometry and DLCO    FOLLOWUP Return in about 9 months (around 01/10/2025)  for 15 min visit, with Dr Bertrum Brodie, after Spiro and DLCO, with any of the APPS, Face to Face Visit.    SIGNATURE    Dr. Maire Scot, M.D., F.C.C.P,  Pulmonary and Critical Care Medicine Staff Physician, [facility] Center Director - Interstitial Lung Disease  Program  Pulmonary Fibrosis [facility] at [facility] [place], [place], [address]  Pager: [phone], If no answer or between  15:00h - 7:00h: call [phone] Telephone: [phone]  5:05 PM 04/12/2024 \

## 2024-04-12 NOTE — Patient Instructions (Addendum)
 ICD-10-CM   1. Moderate asthma without complication, unspecified whether persistent  J45.909     2. Hiatal hernia  K44.9     3. Chronic cough  R05.3     4. DOE (dyspnea on exertion)  R06.09        # Asthma  - Stable and under control with Symbicort  and Singulair   Plan - Continue Symbicort  and Singulair  - If symptoms continue we can consider Biologic  #Hiatal hernia and chronic cough  -Stable but symptomatic  Plan - I will send a note to Dr. Bonnie Butters to see if you will benefit from a pH probe study  #Postinflammatory interstitial lung abnormalities  -Your CT scan was normal in 2009, 2013 and 2020 December.  But then in October 2021 it abruptly changed after you had COVID in August 2021.  Since then the latest scan January 2025 shows postinflammatory residual scar particular in the right lower lobe  -This course spector to be stable but over the course of many years some patients it can get worse  Plan - Do spirometry DLCO in 9 months - This requires serial monitoring   #Sleep apnea  Plan  - Per your sleep doctor continue CPAP   Follow-up - Return to see Catherine Hancock in 9 months or to spirometry and DLCO

## 2024-04-12 NOTE — Telephone Encounter (Signed)
 Catherine Hancock   [name]  - You are seeing her next month at this month as a new consult.  She has a hiatal hernia.  She has chronic cough she believes acid reflux controlled because of a cough I thought maybe you should consider pH probe study.  I am t I am just letting you know   Thanks    SIGNATURE    Dr. Maire Scot, M.D., F.C.C.P,  Pulmonary and Critical Care Medicine Staff Physician, [facility] Center Director - Interstitial Lung Disease  Program  Pulmonary Fibrosis [facility] at [facility] [place], [place], [address]   Pager: [phone], If no answer  -> Check AMION or Try [phone] Telephone (clinical office): [phone] Telephone (research): [phone]  5:06 PM 04/12/2024

## 2024-05-25 DIAGNOSIS — E559 Vitamin D deficiency, unspecified: Secondary | ICD-10-CM | POA: Insufficient documentation

## 2024-05-25 DIAGNOSIS — R682 Dry mouth, unspecified: Secondary | ICD-10-CM | POA: Insufficient documentation

## 2024-05-25 DIAGNOSIS — F418 Other specified anxiety disorders: Secondary | ICD-10-CM | POA: Insufficient documentation

## 2024-05-25 DIAGNOSIS — R519 Headache, unspecified: Secondary | ICD-10-CM | POA: Insufficient documentation

## 2024-05-25 DIAGNOSIS — I129 Hypertensive chronic kidney disease with stage 1 through stage 4 chronic kidney disease, or unspecified chronic kidney disease: Secondary | ICD-10-CM | POA: Insufficient documentation

## 2024-05-25 DIAGNOSIS — D649 Anemia, unspecified: Secondary | ICD-10-CM | POA: Insufficient documentation

## 2024-05-25 DIAGNOSIS — R319 Hematuria, unspecified: Secondary | ICD-10-CM | POA: Insufficient documentation

## 2024-05-25 NOTE — Progress Notes (Signed)
 Name: Catherine Hancock DOB: Sep 22, 1961 MRN: [id number]  History of Present Illness: [name] is a 63 y.o. female who presents today as a new patient at [facility] [place].   She reports chief complaint of microscopic hematuria. > 02/14/2024: - Urine microscopy: 10 WBC/hpf, 5 RBC/hpf, bacteria (few), calcium oxalate crystals present  Today: She states that the past 6 urine tests have shown blood in her urine so her PCP sent her for a CT scan at [facility] in [place]  about 2 weeks ago and had imaging which showed right kidney stone(s). She denies prior history of kidney stones.   She reports increased urinary urgency, frequency, nocturia x1. Occasional urinary hesitancy and straining to void. Denies dysuria, gross hematuria, incontinence, or sensations of incomplete emptying.  She reports chronic bilateral low back pain.   She reports chronic nausea / vomiting and bilateral lower abdominal pain for 4-5 months. States she is being followed by GI.   She denies prior history of gross hematuria.  She reports 2 UTIs in the past year. She denies history of GU malignancy or pelvic radiation.  She denies history of autoimmune disease. She denies history of smoking. She reports taking anticoagulants (Aspirin  81 mg).   Medications: Current Outpatient Medications  Medication Sig Dispense Refill   albuterol  (ACCUNEB ) 0.63 MG/3ML nebulizer solution Take 1 ampule by nebulization every 6 (six) hours as needed for wheezing or shortness of breath.     albuterol  (VENTOLIN  HFA) 108 (90 Base) MCG/ACT inhaler Inhale 2 puffs into the lungs every 6 (six) hours. 8 g 0   ALPRAZolam  (XANAX ) 0.5 MG tablet Take 0.25-0.5 mg by mouth 2 (two) times daily as needed for anxiety. PRN     amLODipine (NORVASC) 5 MG tablet Take 5 mg by mouth daily.     aspirin  EC 81 MG tablet Take 1 tablet (81 mg total) by mouth daily.     budesonide -formoterol  (SYMBICORT ) 160-4.5 MCG/ACT inhaler Inhale 2 puffs into  the lungs 2 (two) times daily.     Calcium Carb-Cholecalciferol (CALCIUM 600+D3) 600-800 MG-UNIT TABS Take 1 tablet by mouth daily.     citalopram  (CELEXA ) 20 MG tablet Take 20 mg by mouth at bedtime.     cyclobenzaprine  (FLEXERIL ) 10 MG tablet TAKE 1 TABLET THREE TIMES A DAY AS NEEDED FOR MUSCLE SPASMS (Patient taking differently: Take 10 mg by mouth at bedtime.) 90 tablet 0   diclofenac (VOLTAREN) 50 MG EC tablet Take 50 mg by mouth 2 (two) times daily as needed (hip pain).     DONNATAL  16.2 MG tablet Take 16.2 mg by mouth daily as needed (Stomach pain).      EPINEPHrine 0.3 mg/0.3 mL IJ SOAJ injection Inject 0.3 mg into the muscle as needed for anaphylaxis.      esomeprazole (NEXIUM) 40 MG capsule Take 40 mg by mouth 2 (two) times daily before a meal.      fluticasone  (FLONASE ) 50 MCG/ACT nasal spray Place 2 sprays into both nostrils daily. 16 g 6   HYDROcodone -acetaminophen  (NORCO/VICODIN) 5-325 MG tablet Take 1 tablet by mouth every 6 (six) hours as needed for moderate pain.     ibuprofen  (ADVIL ,MOTRIN ) 800 MG tablet Take 800 mg by mouth every 8 (eight) hours as needed (pain).     levocetirizine (XYZAL) 5 MG tablet Take 1 tablet by mouth every evening.     levothyroxine  (SYNTHROID ) 50 MCG tablet Take 50 mcg by mouth daily before breakfast.     lubiprostone (AMITIZA) 24 MCG capsule  Take 24 mcg by mouth 2 (two) times daily.     montelukast  (SINGULAIR ) 10 MG tablet Take 10 mg by mouth at bedtime.     nitroGLYCERIN  (NITROSTAT ) 0.4 MG SL tablet Place 1 tablet (0.4 mg total) under the tongue every 5 (five) minutes as needed for chest pain. 25 tablet 12   polyethylene glycol powder (GLYCOLAX /MIRALAX ) powder Take 17 g by mouth as needed for mild constipation.      Prenatal Vit-Fe Fumarate-FA (M-NATAL PLUS) 27-1 MG TABS Take 1 tablet by mouth at bedtime.     SEMAGLUTIDE PO Take by mouth. As directed     Vitamin D, Ergocalciferol, (DRISDOL) 1.25 MG (50000 UNIT) CAPS capsule Take 50,000 Units by mouth  every Monday.     zolpidem  (AMBIEN ) 10 MG tablet Take 5 mg by mouth at bedtime as needed for sleep.     BIOTIN PO Take 1 tablet by mouth daily. (Patient not taking: Reported on 10/18/2023)     zinc  gluconate 50 MG tablet Take 50 mg by mouth daily. (Patient not taking: Reported on 05/29/2024)     No current facility-administered medications for this visit.    Allergies: Allergies  Allergen Reactions   Gadolinium Derivatives Other (See Comments)    Severe pain, joints locked up   Latex Other (See Comments) and Shortness Of Breath    Asthma attack    Other Shortness Of Breath and Other (See Comments)    Severe pain, joints locked up gadolinium contrast-causes joints to lock up, severe pain Severe pain, joints locked up gadolinium contrast-causes joints to lock up, severe pain  Asthma attack   Fluticasone -Salmeterol Other (See Comments)    Blurry vision Blurry vision Blurry vision Blurry vision Blurry vision   Iodinated Contrast Media Other (See Comments)    gadolinium contrast-causes joints to lock up, severe pain   Doxycycline Itching and Rash   Sulfamethoxazole-Trimethoprim Diarrhea and Nausea And Vomiting    REACTION: GI upset Other reaction(s): OTHER REACTION: GI upset Other reaction(s): OTHER Other reaction(s): OTHER    Past Medical History:  Diagnosis Date   Asthma    Asthma    Chest pain    LHC 7/08: EF 60%, normal coronaries; ETT-Echo 6/11: EF 55-60%, no ischemia   Chronic back pain    CTS (carpal tunnel syndrome)    bil   CTS (carpal tunnel syndrome)    Depression 05/28/2020   Hypothyroidism    IBS (irritable bowel syndrome)    Internal hemorrhoids    Migraines    OSA on CPAP 11/13/2017   Mild with AHI 13/hr    Pulmonary nodule, right    Past Surgical History:  Procedure Laterality Date   BREAST EXCISIONAL BIOPSY Right    benign   breast nodule surg     right breast nodule and left lymph node removed in 1994   COLONOSCOPY  06/09/2012   Procedure:  COLONOSCOPY;  Surgeon: Princella CHRISTELLA Nida, MD;  Location: [facility];  Service: Endoscopy;  Laterality: N/A;   EXCISION OF BREAST BIOPSY Left    2 benign lymph nodes removed   HEMORRHOID SURGERY     2007   LYMPH NODE BIOPSY     left breast   NASAL SINUS SURGERY     1992   TONSILLECTOMY     1981   TOTAL VAGINAL HYSTERECTOMY     Family History  Problem Relation Age of Onset   Colon cancer Maternal Aunt        x 4  Colon cancer Maternal Uncle    Colon cancer Maternal Grandmother    Heart disease Maternal Grandfather    Breast cancer Maternal Aunt    Ovarian cancer Maternal Aunt    Lymphoma Mother    Prostate cancer Father    COPD Father    Heart failure Father    Hypertension Father    Heart disease Paternal Uncle        x 4   Heart disease Paternal Grandfather    Mitral valve prolapse Sister    Social History   Socioeconomic History   Marital status: Married    Spouse name: Manus   Number of children: 3   Years of education: 12   Highest education level: Not on file  Occupational History   Not on file  Tobacco Use   Smoking status: Never   Smokeless tobacco: Never  Vaping Use   Vaping status: Never Used  Substance and Sexual Activity   Alcohol use: No   Drug use: No   Sexual activity: Yes    Partners: Male  Other Topics Concern   Not on file  Social History Narrative   Live at home with husband and son.   Right handed.   Caffeine use: drinks 1 pepsi/day    Social Drivers of Corporate investment banker Strain: Not on file  Food Insecurity: Not on file  Transportation Needs: Not on file  Physical Activity: Not on file  Stress: Not on file  Social Connections: Not on file  Intimate Partner Violence: Not on file    SUBJECTIVE  Review of Systems Constitutional: Patient denies any unintentional weight loss or change in strength lntegumentary: Patient denies any rashes or pruritus Cardiovascular: Patient denies chest pain or syncope Respiratory:  Patient denies shortness of breath Gastrointestinal: As per HPI Musculoskeletal: As per HPI  Neurologic: Patient denies convulsions or seizures Allergic/Immunologic: Patient denies recent allergic reaction(s) Hematologic/Lymphatic: Patient denies bleeding tendencies Endocrine: Patient denies heat/cold intolerance  GU: As per HPI.  OBJECTIVE Vitals:   05/29/24 0859  BP: 108/72  Pulse: (!) 105   There is no height or weight on file to calculate BMI.  Physical Examination Constitutional: No obvious distress; patient is non-toxic appearing  Cardiovascular: No visible lower extremity edema.  Respiratory: The patient does not have audible wheezing/stridor; respirations do not appear labored  Gastrointestinal: Abdomen non-distended Musculoskeletal: Normal ROM of UEs  Skin: No obvious rashes/open sores  Neurologic: CN 2-12 grossly intact Psychiatric: Answered questions appropriately with normal affect  Hematologic/Lymphatic/Immunologic: No obvious bruises or sites of spontaneous bleeding  - UA: trace leukocytes, trace blood, no nitrites - Urine microscopy: 0-5 WBC/hpf, 0-2 RBC/hpf, few bacteria  PVR: 2 ml  ASSESSMENT Hematuria, unspecified type - Plan: Urinalysis, Routine w reflex microscopic, BLADDER SCAN AMB NON-IMAGING  For microscopic hematuria we considered possible etiologies including but not limited to: vigorous exercise, sexual activity, stone, trauma, blood thinner use, urinary tract infection, urethral irritation secondary to vaginal atrophy, chronic kidney disease, glomerulonephropathy, malignancy.   We reviewed the difference between urinalysis and urine microscopy as it pertains to asymptomatic microscopic hematuria.   No evidence of microscopic hematuria on today's urine microscopy. Suspect that prior findings may have been related to GU stone(s) and/or vaginal atrophy / genitourinary syndrome of menopause (GSM), however will need to request / review additional  outside records from PCP, GYN, and [facility] including urinalyses, urine microscopy, urine cultures within the past 12 months along with recent CT imaging report.  Will plan to notify patient with recommendation(s) for next steps after review of outside records.   Pt verbalized understanding and agreement. All questions were answered.   PLAN Advised the following: Requesting records from PCP, GYN, and [facility]. 2. Return for follow up to be determined based on records review.  Orders Placed This Encounter  Procedures   Urinalysis, Routine w reflex microscopic   BLADDER SCAN AMB NON-IMAGING    It has been explained that the patient is to follow regularly with their PCP in addition to all other providers involved in their care and to follow instructions provided by these respective offices. Patient advised to contact urology clinic if any urologic-pertaining questions, concerns, new symptoms or problems arise in the interim period.  There are no Patient Instructions on file for this visit.  Electronically signed by:  Lauraine KYM Oz, MSN, FNP-C, CUNP 05/29/2024 9:40 AM

## 2024-05-28 ENCOUNTER — Ambulatory Visit: Admitting: Gastroenterology

## 2024-05-29 ENCOUNTER — Ambulatory Visit (INDEPENDENT_AMBULATORY_CARE_PROVIDER_SITE_OTHER): Admitting: Urology

## 2024-05-29 ENCOUNTER — Encounter: Payer: Self-pay | Admitting: Urology

## 2024-05-29 VITALS — BP 108/72 | HR 105

## 2024-05-29 DIAGNOSIS — Z87440 Personal history of urinary (tract) infections: Secondary | ICD-10-CM | POA: Diagnosis not present

## 2024-05-29 DIAGNOSIS — G5603 Carpal tunnel syndrome, bilateral upper limbs: Secondary | ICD-10-CM

## 2024-05-29 DIAGNOSIS — R319 Hematuria, unspecified: Secondary | ICD-10-CM

## 2024-05-29 LAB — MICROSCOPIC EXAMINATION

## 2024-05-29 LAB — URINALYSIS, ROUTINE W REFLEX MICROSCOPIC
Bilirubin, UA: NEGATIVE
Glucose, UA: NEGATIVE
Nitrite, UA: NEGATIVE
Specific Gravity, UA: 1.03 (ref 1.005–1.030)
Urobilinogen, Ur: 4 mg/dL — ABNORMAL HIGH (ref 0.2–1.0)
pH, UA: 6 (ref 5.0–7.5)

## 2024-05-31 ENCOUNTER — Other Ambulatory Visit: Payer: Self-pay | Admitting: Urology

## 2024-05-31 DIAGNOSIS — N200 Calculus of kidney: Secondary | ICD-10-CM

## 2024-05-31 DIAGNOSIS — R3129 Other microscopic hematuria: Secondary | ICD-10-CM

## 2024-06-06 ENCOUNTER — Ambulatory Visit (INDEPENDENT_AMBULATORY_CARE_PROVIDER_SITE_OTHER): Admitting: Gastroenterology

## 2024-06-06 ENCOUNTER — Encounter: Payer: Self-pay | Admitting: Gastroenterology

## 2024-06-06 VITALS — BP 122/68 | HR 68 | Ht 60.0 in | Wt 145.0 lb

## 2024-06-06 DIAGNOSIS — K581 Irritable bowel syndrome with constipation: Secondary | ICD-10-CM | POA: Diagnosis not present

## 2024-06-06 DIAGNOSIS — R1319 Other dysphagia: Secondary | ICD-10-CM

## 2024-06-06 DIAGNOSIS — K219 Gastro-esophageal reflux disease without esophagitis: Secondary | ICD-10-CM | POA: Diagnosis not present

## 2024-06-06 DIAGNOSIS — K222 Esophageal obstruction: Secondary | ICD-10-CM

## 2024-06-06 DIAGNOSIS — Z8719 Personal history of other diseases of the digestive system: Secondary | ICD-10-CM

## 2024-06-06 DIAGNOSIS — Z1211 Encounter for screening for malignant neoplasm of colon: Secondary | ICD-10-CM

## 2024-06-06 DIAGNOSIS — R1310 Dysphagia, unspecified: Secondary | ICD-10-CM | POA: Diagnosis not present

## 2024-06-06 DIAGNOSIS — K449 Diaphragmatic hernia without obstruction or gangrene: Secondary | ICD-10-CM

## 2024-06-06 DIAGNOSIS — Z79899 Other long term (current) drug therapy: Secondary | ICD-10-CM

## 2024-06-06 DIAGNOSIS — R053 Chronic cough: Secondary | ICD-10-CM

## 2024-06-06 DIAGNOSIS — Z800 Family history of malignant neoplasm of digestive organs: Secondary | ICD-10-CM

## 2024-06-06 MED ORDER — NA SULFATE-K SULFATE-MG SULF 17.5-3.13-1.6 GM/177ML PO SOLN: 1.0000 | ORAL | 0 refills | Status: AC

## 2024-06-06 NOTE — Patient Instructions (Signed)
 _______________________________________________________  If your blood pressure at your visit was 140/90 or greater, please contact your primary care physician to follow up on this.  If you are age 63 or younger, your body mass index should be between 19-25. Your Body mass index is 28.32 kg/m. If this is out of the aformentioned range listed, please consider follow up with your Primary Care Provider.  ________________________________________________________  The [facility] providers would like to encourage you to use MYCHART to communicate with providers for non-urgent requests or questions.  Due to long hold times on the telephone, sending your provider a message by [facility] may be a faster and more efficient way to get a response.  Please allow 48 business hours for a response.  Please remember that this is for non-urgent requests.  _______________________________________________________  [facility] is using a team-based approach to care.  Your team is made up of your doctor and two to three APPS. Our APPS (Nurse Practitioners and Physician Assistants) work with your physician to ensure care continuity for you. They are fully qualified to address your health concerns and develop a treatment plan. They communicate directly with your gastroenterologist to care for you. Seeing the Advanced Practice Practitioners on your physician's team can help you by facilitating care more promptly, often allowing for earlier appointments, access to diagnostic testing, procedures, and other specialty referrals.   You have been scheduled for an endoscopy and colonoscopy. Please follow the written instructions given to you at your visit today.  If you use inhalers (even only as needed), please bring them with you on the day of your procedure.  DO NOT TAKE 7 DAYS PRIOR TO TEST- Trulicity (dulaglutide) Ozempic, Wegovy (semaglutide) Mounjaro (tirzepatide) Bydureon Bcise (exanatide extended  release)  DO NOT TAKE 1 DAY PRIOR TO YOUR TEST Rybelsus (semaglutide) Adlyxin (lixisenatide) Victoza (liraglutide) Byetta (exanatide) ___________________________________________________________________________  Due to recent changes in healthcare laws, you may see the results of your imaging and laboratory studies on MyChart before your provider has had a chance to review them.  We understand that in some cases there may be results that are confusing or concerning to you. Not all laboratory results come back in the same time frame and the provider may be waiting for multiple results in order to interpret others.  Please give us  48 hours in order for your provider to thoroughly review all the results before contacting the office for clarification of your results.   It was a pleasure to see you today!  Vito Cirigliano, D.O.

## 2024-06-06 NOTE — Progress Notes (Signed)
 Chief Complaint: GERD, colon cancer screening   Referring Provider:     Toy Art, MD    HPI:     Catherine Hancock is a 63 y.o. female with a history of asthma, hypothyroidism, migraines, OSA (on CPAP), depression, referred to the Gastroenterology Clinic for evaluation of GERD and colon cancer screening.   Longstanding history of GERD.   Currently on Nexium 40 mg BID.  Reflux symptoms well controlled on current therapy. Index symptoms of heartburn, regurgitation.  Does endorse intermittent solid food dysphagia, with increased frequency recently.  Known history of Schatzki's ring, and improvement in dysphagia with EGD with esophageal dilations in the past.   She was recently evaluated in the ENT clinic for chronic cough for 20+ years.  Does also report episodic right ear pain and fluid in her ears. Was evaluated with CT sinuses, started Flonase , continued Xyzal, recommended daily nasal irrigation.  Laryngoscopy with changes around the voicebox consistent with silent reflux and recommended continued Nexium, consider reflux Gourmet supplement.  Separately, reports history of IBS-C, generally well-controlled with Amitiza along with MiraLAX  1 capful daily which maintains soft stools without straining to have BM.    Family history notable for maternal aunt, maternal uncle, and maternal grandmother with colon cancer.  Endoscopic History: - 06/09/2012: Colonoscopy (Dr. Obie): Internal hemorrhoids, otherwise normal.  Repeat 10 years - 10/06/2016: Barium esophagram: Small sliding-type hiatal hernia, Schatzki's ring at the GE junction with 12.5 mm barium tablet temporarily held up at this level with eventual spontaneous passage, severe gastroesophageal reflux to the level of the thoracic inlet - 10/12/2016: EGD [facility]): Moderately tortuous lower esophagus, LA Grade B erosive esophagitis, Schatzki's ring dilated with 18 mm TTS balloon, small hiatal hernia, patchy mild antral  gastritis - 07/05/2017: EGD [facility]): Gastric inlet patch, significantly tortuous mid and lower esophagus, 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 by 4 mm wide, nonobstructing Schatzki's ring dilated with 19 mm TTS balloon, patchy moderate antral gastritis, normal duodenum  Past Medical History:  Diagnosis Date   Anal fissure    Asthma    Asthma    Chest pain    LHC 7/08: EF 60%, normal coronaries; ETT-Echo 6/11: EF 55-60%, no ischemia   Chronic back pain    Chronic headache    CTS (carpal tunnel syndrome)    bil   CTS (carpal tunnel syndrome)    Depression 05/28/2020   Hypothyroidism    IBS (irritable bowel syndrome)    Internal hemorrhoids    Migraines    OSA on CPAP 11/13/2017   Mild with AHI 13/hr    Pulmonary nodule, right    Status post dilation of esophageal narrowing    UTI (urinary tract infection)      Past Surgical History:  Procedure Laterality Date   BREAST EXCISIONAL BIOPSY Right    benign   breast nodule surg     right breast nodule and left lymph node removed in 1994   COLONOSCOPY  06/09/2012   Procedure: COLONOSCOPY;  Surgeon: Princella CHRISTELLA Obie, MD;  Location: [facility];  Service: Endoscopy;  Laterality: N/A;   EXCISION OF BREAST BIOPSY Left    2 benign lymph nodes removed   EYE SURGERY  2023   left eye   HEMORRHOID SURGERY     2007   LYMPH NODE BIOPSY     left breast   NASAL SINUS SURGERY     1992  TONSILLECTOMY     1981   TOTAL VAGINAL HYSTERECTOMY     Family History  Problem Relation Age of Onset   Lymphoma Mother    Prostate cancer Father    COPD Father    Heart failure Father    Hypertension Father    Mitral valve prolapse Sister    Colon cancer Maternal Grandmother    Heart disease Maternal Grandfather    Heart disease Paternal Grandfather    Colon cancer Maternal Aunt        x 4   Breast cancer Maternal Aunt    Ovarian cancer Maternal Aunt    Colon cancer Maternal Uncle    Heart disease Paternal Uncle        x 4    Liver disease Neg Hx    Liver cancer Neg Hx    Social History   Tobacco Use   Smoking status: Never   Smokeless tobacco: Never  Vaping Use   Vaping status: Never Used  Substance Use Topics   Alcohol use: No   Drug use: No   Current Outpatient Medications  Medication Sig Dispense Refill   albuterol  (ACCUNEB ) 0.63 MG/3ML nebulizer solution Take 1 ampule by nebulization every 6 (six) hours as needed for wheezing or shortness of breath.     albuterol  (VENTOLIN  HFA) 108 (90 Base) MCG/ACT inhaler Inhale 2 puffs into the lungs every 6 (six) hours. 8 g 0   ALPRAZolam  (XANAX ) 0.5 MG tablet Take 0.25-0.5 mg by mouth 2 (two) times daily as needed for anxiety. PRN     amLODipine (NORVASC) 5 MG tablet Take 5 mg by mouth daily.     aspirin  EC 81 MG tablet Take 1 tablet (81 mg total) by mouth daily.     budesonide -formoterol  (SYMBICORT ) 160-4.5 MCG/ACT inhaler Inhale 2 puffs into the lungs 2 (two) times daily.     Calcium Carb-Cholecalciferol (CALCIUM 600+D3) 600-800 MG-UNIT TABS Take 1 tablet by mouth daily.     citalopram  (CELEXA ) 20 MG tablet Take 20 mg by mouth at bedtime.     cyclobenzaprine  (FLEXERIL ) 10 MG tablet TAKE 1 TABLET THREE TIMES A DAY AS NEEDED FOR MUSCLE SPASMS (Patient taking differently: Take 10 mg by mouth at bedtime.) 90 tablet 0   diclofenac (VOLTAREN) 50 MG EC tablet Take 50 mg by mouth 2 (two) times daily as needed (hip pain).     DONNATAL  16.2 MG tablet Take 16.2 mg by mouth daily as needed (Stomach pain).      EPINEPHrine 0.3 mg/0.3 mL IJ SOAJ injection Inject 0.3 mg into the muscle as needed for anaphylaxis.      esomeprazole (NEXIUM) 40 MG capsule Take 40 mg by mouth 2 (two) times daily before a meal.      fluticasone  (FLONASE ) 50 MCG/ACT nasal spray Place 2 sprays into both nostrils daily. 16 g 6   HYDROcodone -acetaminophen  (NORCO/VICODIN) 5-325 MG tablet Take 1 tablet by mouth every 6 (six) hours as needed for moderate pain.     ibuprofen  (ADVIL ,MOTRIN ) 800 MG tablet  Take 800 mg by mouth every 8 (eight) hours as needed (pain).     levocetirizine (XYZAL) 5 MG tablet Take 1 tablet by mouth every evening.     levothyroxine  (SYNTHROID ) 50 MCG tablet Take 50 mcg by mouth daily before breakfast.     lubiprostone (AMITIZA) 24 MCG capsule Take 24 mcg by mouth 2 (two) times daily.     montelukast  (SINGULAIR ) 10 MG tablet Take 10 mg by mouth at bedtime.  nitroGLYCERIN  (NITROSTAT ) 0.4 MG SL tablet Place 1 tablet (0.4 mg total) under the tongue every 5 (five) minutes as needed for chest pain. 25 tablet 12   polyethylene glycol powder (GLYCOLAX /MIRALAX ) powder Take 17 g by mouth as needed for mild constipation.      Prenatal Vit-Fe Fumarate-FA (M-NATAL PLUS) 27-1 MG TABS Take 1 tablet by mouth at bedtime.     SEMAGLUTIDE PO Take by mouth. As directed     Vitamin D, Ergocalciferol, (DRISDOL) 1.25 MG (50000 UNIT) CAPS capsule Take 50,000 Units by mouth every Monday.     zinc  gluconate 50 MG tablet Take 50 mg by mouth daily.     zolpidem  (AMBIEN ) 10 MG tablet Take 5 mg by mouth at bedtime as needed for sleep.     BIOTIN PO Take 1 tablet by mouth daily. (Patient not taking: Reported on 10/18/2023)     No current facility-administered medications for this visit.   Allergies  Allergen Reactions   Gadolinium Derivatives Other (See Comments)    Severe pain, joints locked up   Latex Other (See Comments) and Shortness Of Breath    Asthma attack    Other Shortness Of Breath and Other (See Comments)    Severe pain, joints locked up gadolinium contrast-causes joints to lock up, severe pain Severe pain, joints locked up gadolinium contrast-causes joints to lock up, severe pain  Asthma attack   Fluticasone -Salmeterol Other (See Comments)    Blurry vision Blurry vision Blurry vision Blurry vision Blurry vision   Iodinated Contrast Media Other (See Comments)    gadolinium contrast-causes joints to lock up, severe pain   Doxycycline Itching and Rash    Sulfamethoxazole-Trimethoprim Diarrhea and Nausea And Vomiting    REACTION: GI upset Other reaction(s): OTHER REACTION: GI upset Other reaction(s): OTHER Other reaction(s): OTHER     Review of Systems: All systems reviewed and negative except where noted in HPI.     Physical Exam:    Wt Readings from Last 3 Encounters:  06/06/24 145 lb (65.8 kg)  04/12/24 147 lb (66.7 kg)  10/18/23 142 lb (64.4 kg)    BP 122/68   Pulse 68   Ht 5' (1.524 m)   Wt 145 lb (65.8 kg)   BMI 28.32 kg/m  Constitutional:  Pleasant, in no acute distress. Psychiatric: Normal mood and affect. Behavior is normal. Cardiovascular: Normal rate, regular rhythm. No edema Pulmonary/chest: Effort normal and breath sounds normal. No wheezing, rales or rhonchi. Abdominal: Soft, nondistended, nontender. Bowel sounds active throughout. There are no masses palpable. No hepatomegaly. Neurological: Alert and oriented to person place and time. Skin: Skin is warm and dry. No rashes noted.   ASSESSMENT AND PLAN;   1) Colon cancer [place] 2) Family history of colon cancer - Due for repeat colonoscopy for ongoing screening - Schedule colonoscopy  3) GERD 4) Dysphagia 5) History of Schatzki's ring 6) Hiatal hernia - EGD with esophageal dilation and/or biopsies as appropriate - Continue Nexium 40 mg twice daily - Continue antireflux lifestyle/dietary modification - Will evaluate grade/severity of hiatal hernia time of EGD - Of note, one of her previous endoscopies reported a single column varix which was not seen on other endoscopies.  Reviewed CT from 12/2021 which shows normal-appearing liver and no ascites, varices, or other features of portal hypertension.  Can certainly evaluate for varices at time of EGD as above  7) IBS-C - Well-controlled on current therapy  8) Medication management - Hold semaglutide 1 week prior to procedures.  Typically  injects on Sundays, so can hold Sunday dose and schedule  procedure to be done on a Monday for ease of management  9) Chronic cough - Will evaluate for erosive esophagitis or other endoscopic evidence of LPR as above  The indications, risks, and benefits of EGD and colonoscopy were explained to the patient in detail. Risks include but are not limited to bleeding, perforation, adverse reaction to medications, and cardiopulmonary compromise. Sequelae include but are not limited to the possibility of surgery, hospitalization, and mortality. The patient verbalized understanding and wished to proceed. All questions answered, referred to scheduler and bowel prep ordered. Further recommendations pending results of the exam.      Sandor LULLA Flatter, DO, FACG  06/06/2024, 8:35 AM   Pradhan, Laurance POUR, MD

## 2024-06-08 ENCOUNTER — Ambulatory Visit (HOSPITAL_COMMUNITY)
Admission: RE | Admit: 2024-06-08 | Discharge: 2024-06-08 | Disposition: A | Discharge status: Home / Self Care | Source: Ambulatory Visit | Attending: Urology | Admitting: Urology

## 2024-06-08 DIAGNOSIS — N200 Calculus of kidney: Secondary | ICD-10-CM | POA: Diagnosis present

## 2024-06-08 DIAGNOSIS — R3129 Other microscopic hematuria: Secondary | ICD-10-CM

## 2024-07-09 ENCOUNTER — Other Ambulatory Visit: Payer: Self-pay | Admitting: Gastroenterology

## 2024-07-09 ENCOUNTER — Ambulatory Visit (AMBULATORY_SURGERY_CENTER): Admitting: Gastroenterology

## 2024-07-09 ENCOUNTER — Encounter: Payer: Self-pay | Admitting: Gastroenterology

## 2024-07-09 VITALS — BP 135/69 | HR 68 | Temp 98.4°F | Resp 11 | Ht 60.0 in | Wt 145.0 lb

## 2024-07-09 DIAGNOSIS — K449 Diaphragmatic hernia without obstruction or gangrene: Secondary | ICD-10-CM | POA: Diagnosis not present

## 2024-07-09 DIAGNOSIS — R1310 Dysphagia, unspecified: Secondary | ICD-10-CM | POA: Diagnosis not present

## 2024-07-09 DIAGNOSIS — K2950 Unspecified chronic gastritis without bleeding: Secondary | ICD-10-CM | POA: Diagnosis not present

## 2024-07-09 DIAGNOSIS — Z800 Family history of malignant neoplasm of digestive organs: Secondary | ICD-10-CM

## 2024-07-09 DIAGNOSIS — K317 Polyp of stomach and duodenum: Secondary | ICD-10-CM

## 2024-07-09 DIAGNOSIS — K219 Gastro-esophageal reflux disease without esophagitis: Secondary | ICD-10-CM | POA: Diagnosis not present

## 2024-07-09 DIAGNOSIS — K222 Esophageal obstruction: Secondary | ICD-10-CM | POA: Diagnosis not present

## 2024-07-09 DIAGNOSIS — Z1211 Encounter for screening for malignant neoplasm of colon: Secondary | ICD-10-CM

## 2024-07-09 MED ORDER — SODIUM CHLORIDE 0.9 % IV SOLN: 500.0000 mL | INTRAVENOUS | Status: DC

## 2024-07-09 NOTE — Patient Instructions (Signed)
 Soft diet today then advance as tolerated tomorrow per post dilation diet. Continue present medications Awaiting pathology results Repeat upper as needed for re-treatment Repeat Colonoscopy in 10 years for screening purposes  YOU HAD AN ENDOSCOPIC PROCEDURE TODAY AT THE [facility]:   Refer to the procedure report that was given to you for any specific questions about what was found during the examination.  If the procedure report does not answer your questions, please call your gastroenterologist to clarify.  If you requested that your care partner not be given the details of your procedure findings, then the procedure report has been included in a sealed envelope for you to review at your convenience later.  YOU SHOULD EXPECT: Some feelings of bloating in the abdomen. Passage of more gas than usual.  Walking can help get rid of the air that was put into your GI tract during the procedure and reduce the bloating. If you had a lower endoscopy (such as a colonoscopy or flexible sigmoidoscopy) you may notice spotting of blood in your stool or on the toilet paper. If you underwent a bowel prep for your procedure, you may not have a normal bowel movement for a few days.  Please Note:  You might notice some irritation and congestion in your nose or some drainage.  This is from the oxygen used during your procedure.  There is no need for concern and it should clear up in a day or so.  SYMPTOMS TO REPORT IMMEDIATELY:  Following lower endoscopy (colonoscopy or flexible sigmoidoscopy):  Excessive amounts of blood in the stool  Significant tenderness or worsening of abdominal pains  Swelling of the abdomen that is new, acute  Fever of 100F or higher  Following upper endoscopy (EGD)  Vomiting of blood or coffee ground material  New chest pain or pain under the shoulder blades  Painful or persistently difficult swallowing  New shortness of breath  Fever of 100F or higher  Black,  tarry-looking stools  For urgent or emergent issues, a gastroenterologist can be reached at any hour by calling [phone]. Do not use MyChart messaging for urgent concerns.    DIET:  We do recommend a small meal at first, but then you may proceed to your regular diet.  Drink plenty of fluids but you should avoid alcoholic beverages for 24 hours.  ACTIVITY:  You should plan to take it easy for the rest of today and you should NOT DRIVE or use heavy machinery until tomorrow (because of the sedation medicines used during the test).    FOLLOW UP: Our staff will call the number listed on your records the next business day following your procedure.  We will call around 7:15- 8:00 am to check on you and address any questions or concerns that you may have regarding the information given to you following your procedure. If we do not reach you, we will leave a message.     If any biopsies were taken you will be contacted by phone or by letter within the next 1-3 weeks.  Please call us  at [phone] if you have not heard about the biopsies in 3 weeks.    SIGNATURES/CONFIDENTIALITY: You and/or your care partner have signed paperwork which will be entered into your electronic medical record.  These signatures attest to the fact that that the information above on your After Visit Summary has been reviewed and is understood.  Full responsibility of the confidentiality of this discharge information lies with you and/or  your care-partner.

## 2024-07-09 NOTE — Progress Notes (Signed)
 GASTROENTEROLOGY PROCEDURE H&P NOTE   Primary Care Physician: Toy Laurance POUR, MD    Reason for Procedure:   GERD, chronic cough, dysphagia, Fhx of colon cancer   Plan:    EGD, colonoscopy  Patient is appropriate for endoscopic procedure(s) in the ambulatory ([facility]) setting.  The nature of the procedure, as well as the risks, benefits, and alternatives were carefully and thoroughly reviewed with the patient. Ample time for discussion and questions allowed. The patient understood, was satisfied, and agreed to proceed.     HPI: Catherine Hancock is a 63 y.o. female who presents for EGD for evaluation of GERD, chronic cough, regurgitation, dysphagia, and BE screening.   Family history notable for maternal aunt, maternal uncle, and maternal grandmother with colon cancer. Presents for colonoscopy today for ongoing screening.   Endoscopic History: - 06/09/2012: Colonoscopy (Dr. Obie): Internal hemorrhoids, otherwise normal.  Repeat 10 years - 10/06/2016: Barium esophagram: Small sliding-type hiatal hernia, Schatzki's ring at the GE junction with 12.5 mm barium tablet temporarily held up at this level with eventual spontaneous passage, severe gastroesophageal reflux to the level of the thoracic inlet - 10/12/2016: EGD [facility]): Moderately tortuous lower esophagus, LA Grade B erosive esophagitis, Schatzki's ring dilated with 18 mm TTS balloon, small hiatal hernia, patchy mild antral gastritis - 07/05/2017: EGD [facility]): Gastric inlet patch, significantly tortuous mid and lower esophagus, 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 by 4 mm wide, nonobstructing Schatzki's ring dilated with 19 mm TTS balloon, patchy moderate antral gastritis, normal duodenum  Past Medical History:  Diagnosis Date   Anal fissure    Asthma    Asthma    Chest pain    LHC 7/08: EF 60%, normal coronaries; ETT-Echo 6/11: EF 55-60%, no ischemia   Chronic back pain    Chronic headache    CTS (carpal tunnel  syndrome)    bil   CTS (carpal tunnel syndrome)    Depression 05/28/2020   Hypothyroidism    IBS (irritable bowel syndrome)    Internal hemorrhoids    Migraines    OSA on CPAP 11/13/2017   Mild with AHI 13/hr    Pulmonary nodule, right    Status post dilation of esophageal narrowing    UTI (urinary tract infection)     Past Surgical History:  Procedure Laterality Date   BREAST EXCISIONAL BIOPSY Right    benign   breast nodule surg     right breast nodule and left lymph node removed in 1994   COLONOSCOPY  06/09/2012   Procedure: COLONOSCOPY;  Surgeon: Princella CHRISTELLA Obie, MD;  Location: [facility];  Service: Endoscopy;  Laterality: N/A;   EXCISION OF BREAST BIOPSY Left    2 benign lymph nodes removed   EYE SURGERY  2023   left eye   HEMORRHOID SURGERY     2007   LYMPH NODE BIOPSY     left breast   NASAL SINUS SURGERY     1992   TONSILLECTOMY     1981   TOTAL VAGINAL HYSTERECTOMY      Prior to Admission medications   Medication Sig Start Date End Date Taking? Authorizing Provider  albuterol  (ACCUNEB ) 0.63 MG/3ML nebulizer solution Take 1 ampule by nebulization every 6 (six) hours as needed for wheezing or shortness of breath. 07/17/20  Yes [provider]  amLODipine (NORVASC) 5 MG tablet Take 5 mg by mouth daily. 01/18/22  Yes [provider]  fluticasone  (FLONASE ) 50 MCG/ACT nasal spray Place  2 sprays into both nostrils daily. 12/02/23  Yes Soldatova, Liuba, MD  levothyroxine  (SYNTHROID ) 50 MCG tablet Take 50 mcg by mouth daily before breakfast.   Yes [provider]  Na Sulfate-K Sulfate-Mg Sulfate concentrate (SUPREP BOWEL PREP KIT) 17.5-3.13-1.6 GM/177ML SOLN Take 1 kit (354 mLs total) by mouth as directed. 06/06/24  Yes Janit Cutter V, DO  promethazine  (PHENERGAN ) 25 MG tablet See Instructions, TAKE 1 TABLET TWICE A DAY AS NEEDED FOR NAUSEA/VOMITING, # 60 tab, 11 Refill(s), Pharmacy: [facility] 08/12/22  Yes [provider]  raloxifene (EVISTA) 60 MG tablet Take 1 tablet every day by oral route. 02/21/24  Yes [provider]  albuterol  (VENTOLIN  HFA) 108 (90 Base) MCG/ACT inhaler Inhale 2 puffs into the lungs every 6 (six) hours. 06/05/20   Arrien, Mauricio Daniel, MD  ALPRAZolam  (XANAX ) 0.5 MG tablet Take 0.25-0.5 mg by mouth 2 (two) times daily as needed for anxiety. PRN 11/20/11   [provider]  aspirin  EC 81 MG tablet Take 1 tablet (81 mg total) by mouth daily. 03/18/17   End, Lonni, MD  BIOTIN PO Take 1 tablet by mouth daily. Patient not taking: Reported on 10/18/2023    [provider]  budesonide -formoterol  (SYMBICORT ) 160-4.5 MCG/ACT inhaler Inhale 2 puffs into the lungs 2 (two) times daily. 02/01/20   [provider]  Calcium Carb-Cholecalciferol (CALCIUM 600+D3) 600-800 MG-UNIT TABS Take 1 tablet by mouth daily. 12/09/17   [provider]  citalopram  (CELEXA ) 20 MG tablet Take 20 mg by mouth at bedtime. 11/10/18   [provider]  cyclobenzaprine  (FLEXERIL ) 10 MG tablet TAKE 1 TABLET THREE TIMES A DAY AS NEEDED FOR MUSCLE SPASMS Patient taking differently: Take 10 mg by mouth at bedtime. 03/10/16   Ines Onetha NOVAK, MD  diclofenac (VOLTAREN) 50 MG EC tablet Take 50 mg by mouth 2 (two) times daily as needed (hip pain). 03/16/18   [provider]  DONNATAL  16.2 MG tablet Take 16.2 mg by mouth daily as needed (Stomach pain).  02/13/17   [provider]  EPINEPHrine 0.3 mg/0.3 mL IJ SOAJ injection Inject 0.3 mg into the muscle as needed for anaphylaxis.  05/25/19   [provider]  esomeprazole (NEXIUM) 40 MG capsule Take 40 mg by mouth 2 (two) times daily before a meal.  03/02/17 06/06/24  [provider]  guaiFENesin  (MUCINEX ) 600 MG 12 hr tablet     [provider]  HYDROcodone -acetaminophen  (NORCO/VICODIN) 5-325 MG tablet Take 1 tablet by mouth every 6 (six) hours as needed for moderate pain.    [provider]  ibuprofen  (ADVIL ,MOTRIN ) 800 MG tablet Take 800 mg by mouth every 8 (eight) hours as needed (pain).    [provider]  levocetirizine (XYZAL) 5 MG tablet Take 1 tablet by mouth every evening. 07/16/19   [provider]  lubiprostone (AMITIZA) 24 MCG capsule Take 24 mcg by mouth 2 (two) times daily. 03/16/18   [provider]  montelukast  (SINGULAIR ) 10 MG tablet Take 10 mg by mouth at bedtime.    [provider]  nitroGLYCERIN  (NITROSTAT ) 0.4 MG SL tablet Place 1 tablet (0.4 mg total) under the tongue every 5 (five) minutes as needed for chest pain. 03/06/12 06/06/24  Rolan Ezra RAMAN, MD  polyethylene glycol powder (GLYCOLAX /MIRALAX ) powder Take 17 g by mouth as needed for mild constipation.     [provider]  Prenatal Vit-Fe Fumarate-FA (M-NATAL PLUS) 27-1 MG TABS Take 1 tablet by mouth at  bedtime. 10/22/20   [provider]  SEMAGLUTIDE PO Take by mouth. As directed    [provider]  Vitamin D, Ergocalciferol, (DRISDOL) 1.25 MG (50000 UNIT) CAPS capsule Take 50,000 Units by mouth every Monday. 04/08/20   [provider]  zinc  gluconate 50 MG tablet Take 50 mg by mouth daily. Patient not taking: Reported on 07/09/2024    [provider]  zolpidem  (AMBIEN ) 10 MG tablet Take 5 mg by mouth at bedtime as needed for sleep. 11/20/11   [provider]    Current Outpatient Medications  Medication Sig Dispense Refill   albuterol  (ACCUNEB ) 0.63 MG/3ML nebulizer solution Take 1 ampule by nebulization every 6 (six) hours as needed for wheezing or shortness of breath.     amLODipine (NORVASC) 5 MG tablet Take 5 mg by mouth daily.     fluticasone  (FLONASE ) 50 MCG/ACT nasal spray Place 2 sprays into both nostrils daily. 16 g 6   levothyroxine  (SYNTHROID ) 50 MCG tablet Take 50 mcg by mouth daily before breakfast.     Na Sulfate-K Sulfate-Mg Sulfate concentrate (SUPREP BOWEL PREP KIT) 17.5-3.13-1.6 GM/177ML  SOLN Take 1 kit (354 mLs total) by mouth as directed. 324 mL 0   promethazine  (PHENERGAN ) 25 MG tablet See Instructions, TAKE 1 TABLET TWICE A DAY AS NEEDED FOR NAUSEA/VOMITING, # 60 tab, 11 Refill(s), Pharmacy: [facility]     raloxifene (EVISTA) 60 MG tablet Take 1 tablet every day by oral route.     albuterol  (VENTOLIN  HFA) 108 (90 Base) MCG/ACT inhaler Inhale 2 puffs into the lungs every 6 (six) hours. 8 g 0   ALPRAZolam  (XANAX ) 0.5 MG tablet Take 0.25-0.5 mg by mouth 2 (two) times daily as needed for anxiety. PRN     aspirin  EC 81 MG tablet Take 1 tablet (81 mg total) by mouth daily.     BIOTIN PO Take 1 tablet by mouth daily. (Patient not taking: Reported on 10/18/2023)     budesonide -formoterol  (SYMBICORT ) 160-4.5 MCG/ACT inhaler Inhale 2 puffs into the lungs 2 (two) times daily.     Calcium Carb-Cholecalciferol (CALCIUM 600+D3) 600-800 MG-UNIT TABS Take 1 tablet by mouth daily.     citalopram  (CELEXA ) 20 MG tablet Take 20 mg by mouth at bedtime.     cyclobenzaprine  (FLEXERIL ) 10 MG tablet TAKE 1 TABLET THREE TIMES A DAY AS NEEDED FOR MUSCLE SPASMS (Patient taking differently: Take 10 mg by mouth at bedtime.) 90 tablet 0   diclofenac (VOLTAREN) 50 MG EC tablet Take 50 mg by mouth 2 (two) times daily as needed (hip pain).     DONNATAL  16.2 MG tablet Take 16.2 mg by mouth daily as needed (Stomach pain).      EPINEPHrine 0.3 mg/0.3 mL IJ SOAJ injection Inject 0.3 mg into the muscle as needed for anaphylaxis.      esomeprazole (NEXIUM) 40 MG capsule Take 40 mg by mouth 2 (two) times daily before a meal.      guaiFENesin  (MUCINEX ) 600 MG 12 hr tablet      HYDROcodone -acetaminophen  (NORCO/VICODIN) 5-325 MG tablet Take 1 tablet by mouth every 6 (six) hours as needed for moderate pain.     ibuprofen  (ADVIL ,MOTRIN ) 800 MG tablet Take 800 mg by mouth every 8 (eight) hours as needed (pain).     levocetirizine (XYZAL) 5 MG tablet Take 1 tablet by mouth every evening.     lubiprostone  (AMITIZA) 24 MCG capsule Take 24 mcg by mouth 2 (two) times daily.  montelukast  (SINGULAIR ) 10 MG tablet Take 10 mg by mouth at bedtime.     nitroGLYCERIN  (NITROSTAT ) 0.4 MG SL tablet Place 1 tablet (0.4 mg total) under the tongue every 5 (five) minutes as needed for chest pain. 25 tablet 12   polyethylene glycol powder (GLYCOLAX /MIRALAX ) powder Take 17 g by mouth as needed for mild constipation.      Prenatal Vit-Fe Fumarate-FA (M-NATAL PLUS) 27-1 MG TABS Take 1 tablet by mouth at bedtime.     SEMAGLUTIDE PO Take by mouth. As directed     Vitamin D, Ergocalciferol, (DRISDOL) 1.25 MG (50000 UNIT) CAPS capsule Take 50,000 Units by mouth every Monday.     zinc  gluconate 50 MG tablet Take 50 mg by mouth daily. (Patient not taking: Reported on 07/09/2024)     zolpidem  (AMBIEN ) 10 MG tablet Take 5 mg by mouth at bedtime as needed for sleep.     Current Facility-Administered Medications  Medication Dose Route Frequency Provider Last Rate Last Admin   0.9 %  sodium chloride  infusion  500 mL Intravenous Continuous Jjesus Dingley V, DO        Allergies as of 07/09/2024 - Review Complete 07/09/2024  Allergen Reaction Noted   Gadolinium derivatives Other (See Comments) 10/12/2016   Latex Other (See Comments) and Shortness Of Breath 10/12/2016   Other Shortness Of Breath and Other (See Comments) 02/12/2015   Fluticasone -salmeterol Other (See Comments) 07/26/2013   Iodinated contrast media Other (See Comments) 02/12/2015   Doxycycline Itching and Rash 07/21/2020   Sulfamethoxazole-trimethoprim Diarrhea and Nausea And Vomiting 07/26/2013    Family History  Problem Relation Age of Onset   Lymphoma Mother    Prostate cancer Father    COPD Father    Heart failure Father    Hypertension Father    Mitral valve prolapse Sister    Colon cancer Maternal Grandmother    Heart disease Maternal Grandfather    Heart disease Paternal Grandfather    Colon cancer Maternal Aunt        x 4   Breast cancer  Maternal Aunt    Ovarian cancer Maternal Aunt    Colon cancer Maternal Uncle    Heart disease Paternal Uncle        x 4   Liver disease Neg Hx    Liver cancer Neg Hx     Social History   Socioeconomic History   Marital status: Married    Spouse name: Manus   Number of children: 3   Years of education: 12   Highest education level: Not on file  Occupational History   Occupation: retired  Tobacco Use   Smoking status: Never   Smokeless tobacco: Never  Vaping Use   Vaping status: Never Used  Substance and Sexual Activity   Alcohol use: No   Drug use: No   Sexual activity: Yes    Partners: Male  Other Topics Concern   Not on file  Social History Narrative   Live at home with husband and son.   Right handed.   Caffeine use: drinks 1 pepsi/day    Social Drivers of Corporate investment banker Strain: Not on file  Food Insecurity: Not on file  Transportation Needs: Not on file  Physical Activity: Not on file  Stress: Not on file  Social Connections: Not on file  Intimate Partner Violence: Not on file    Physical Exam: Vital signs in last 24 hours: @BP  117/75   Pulse 80   Temp 98.4 F (  36.9 C) (Temporal)   Ht 5' (1.524 m)   Wt 145 lb (65.8 kg)   SpO2 100%   BMI 28.32 kg/m  GEN: NAD EYE: Sclerae anicteric ENT: MMM CV: Non-tachycardic Pulm: CTA b/l GI: Soft, NT/ND NEURO:  Alert & Oriented x 3   Sandor Flatter, DO [facility]   07/09/2024 1:57 PM

## 2024-07-09 NOTE — Progress Notes (Signed)
 Sedate, gd SR, tolerated procedure well, VSS, report to RN

## 2024-07-09 NOTE — Op Note (Signed)
 [facility] Patient Name: Catherine Hancock Procedure Date: 07/09/2024 1:54 PM MRN: [id number] Endoscopist: Sandor Flatter , MD, [id number] Age: 63 Referring MD:  Date of Birth: 19-May-1961 Gender: Female Account #: [id number] Procedure:                Colonoscopy Indications:              Screening for colorectal malignant neoplasm (last                            colonoscopy was more than 10 years ago).                           Family history notable for maternal aunt, maternal                            uncle, and maternal grandmother with colon cancer. Medicines:                Monitored Anesthesia Care Procedure:                Pre-Anesthesia Assessment:                           - Prior to the procedure, a History and Physical                            was performed, and patient medications and                            allergies were reviewed. The patient's tolerance of                            previous anesthesia was also reviewed. The risks                            and benefits of the procedure and the sedation                            options and risks were discussed with the patient.                            All questions were answered, and informed consent                            was obtained. Prior Anticoagulants: The patient has                            taken no anticoagulant or antiplatelet agents. ASA                            Grade Assessment: II - A patient with mild systemic                            disease. After reviewing the risks and benefits,  the patient was deemed in satisfactory condition to                            undergo the procedure.                           After obtaining informed consent, the colonoscope                            was passed under direct vision. Throughout the                            procedure, the patient's blood pressure, pulse, and                            oxygen saturations  were monitored continuously. The                            CF [id number] #[id number] was introduced through the anus                            and advanced to the the terminal ileum. The                            colonoscopy was performed without difficulty. The                            patient tolerated the procedure well. The quality                            of the bowel preparation was good. The terminal                            ileum, ileocecal valve, appendiceal orifice, and                            rectum were photographed. Scope In: 2:18:53 PM Scope Out: 2:35:58 PM Scope Withdrawal Time: 0 hours 11 minutes 38 seconds  Total Procedure Duration: 0 hours 17 minutes 5 seconds  Findings:                 The perianal and digital rectal examinations were                            normal.                           The entire colon appeared normal.                           The terminal ileum appeared normal.                           The retroflexed view of the distal rectum and anal  verge was normal and showed no anal or rectal                            abnormalities. Complications:            No immediate complications. Estimated Blood Loss:     Estimated blood loss: none. Impression:               - The entire examined colon is normal.                           - The examined portion of the ileum was normal.                           - The distal rectum and anal verge are normal on                            retroflexion view.                           - No specimens collected. Recommendation:           - Patient has a contact number available for                            emergencies. The signs and symptoms of potential                            delayed complications were discussed with the                            patient. Return to normal activities tomorrow.                            Written discharge instructions were provided to the                             patient.                           - Resume previous diet.                           - Continue present medications.                           - Repeat colonoscopy in 10 years for screening                            purposes.                           - Return to GI office PRN. Sandor Flatter, MD 07/09/2024 2:52:09 PM

## 2024-07-09 NOTE — Op Note (Addendum)
 [facility] Patient Name: Catherine Hancock Procedure Date: 07/09/2024 2:01 PM MRN: [id number] Endoscopist: Sandor Flatter , MD, [id number] Age: 63 Referring MD:  Date of Birth: Dec 13, 1960 Gender: Female Account #: [id number] Procedure:                Upper GI endoscopy Indications:              Dysphagia, Esophageal reflux, Chronic cough Medicines:                Monitored Anesthesia Care Procedure:                Pre-Anesthesia Assessment:                           - Prior to the procedure, a History and Physical                            was performed, and patient medications and                            allergies were reviewed. The patient's tolerance of                            previous anesthesia was also reviewed. The risks                            and benefits of the procedure and the sedation                            options and risks were discussed with the patient.                            All questions were answered, and informed consent                            was obtained. Prior Anticoagulants: The patient has                            taken no anticoagulant or antiplatelet agents. ASA                            Grade Assessment: II - A patient with mild systemic                            disease. After reviewing the risks and benefits,                            the patient was deemed in satisfactory condition to                            undergo the procedure.                           After obtaining informed consent, the endoscope was  passed under direct vision. Throughout the                            procedure, the patient's blood pressure, pulse, and                            oxygen saturations were monitored continuously. The                            Olympus Scope [id number] was introduced through the                            mouth, and advanced to the second part of duodenum.                            The  upper GI endoscopy was accomplished without                            difficulty. The patient tolerated the procedure                            well. Scope In: Scope Out: Findings:                 Two benign-appearing, intrinsic mild stenoses were                            found 34-35 cm from the incisors. The narrowest                            stenosis measured 1 cm (in length). The stenoses                            were traversed.                           The Z-line was regular and was found 36 cm from the                            incisors.                           A 1-2 cm sliding type hiatal hernia was present.                           The gastroesophageal flap valve was visualized                            endoscopically and classified as Hill Grade III                            (minimal fold, loose to endoscope, hiatal hernia                            likely).  Two 2 to 3 mm sessile polyps with no bleeding were                            found in the gastric fundus and in the gastric                            body. The polyp was removed with a cold biopsy                            forceps. Resection and retrieval were complete.                            Estimated blood loss was minimal.                           Normal mucosa was found in the entire examined                            stomach.                           The examined duodenum was normal. Complications:            No immediate complications. Estimated Blood Loss:     Estimated blood loss was minimal. Impression:               - Benign-appearing esophageal stenoses.                           - Z-line regular, 36 cm from the incisors.                           - 1-2 cm sliding type hiatal hernia.                           - Gastroesophageal flap valve classified as Hill                            Grade III (minimal fold, loose to endoscope, hiatal                             hernia likely).                           - Two gastric polyps. Resected and retrieved.                           - Normal mucosa was found in the entire stomach.                           - Normal examined duodenum. Recommendation:           - Patient has a contact number available for                            emergencies. The signs and  symptoms of potential                            delayed complications were discussed with the                            patient. Return to normal activities tomorrow.                            Written discharge instructions were provided to the                            patient.                           - Soft diet today then advance as tolerated                            tomorrow per post dilation protocol.                           - Continue present medications.                           - Await pathology results.                           - Repeat upper endoscopy PRN for retreatment.                           - Perform a colonoscopy today.                           - Return to GI clinic PRN. Sandor Flatter, MD 07/09/2024 2:48:36 PM Addendum Number: 1   Addendum Date: 07/17/2024 3:08:50 PM      TTS balloon dilation was performed of the lower esophageal strictures       with an 18-19-20 mm TTS balloon with maximum inflation at 19 mm. After       dilation performed, balloon catheter removed and esophagus was inspected       and only notable for small, but appropriate mucosal disruption,       consistent with appropriate dilation. Sandor Flatter, MD 07/17/2024 3:10:16 PM

## 2024-07-09 NOTE — Progress Notes (Signed)
 Called to room to assist during endoscopic procedure.  Patient ID and intended procedure confirmed with present staff. Received instructions for my participation in the procedure from the performing physician.

## 2024-07-09 NOTE — Progress Notes (Signed)
 Pt's states no medical or surgical changes since previsit or office visit.

## 2024-07-10 ENCOUNTER — Telehealth: Payer: Self-pay

## 2024-07-10 NOTE — Telephone Encounter (Signed)
  Follow up Call-     07/09/2024    1:17 PM  Call back number  Post procedure Call Back phone  # [phone]  Permission to leave phone message Yes     Patient questions:  Do you have a fever, pain , or abdominal swelling? No. Pain Score  0 *  Have you tolerated food without any problems? Yes.    Have you been able to return to your normal activities? Yes.    Do you have any questions about your discharge instructions: Diet   No. Medications  No. Follow up visit  No.  Do you have questions or concerns about your Care? No.  Actions: * If pain score is 4 or above: No action needed, pain <4.  Patient states she had some chest pain when she went home after procedure but felt better after she kept passing gas. She states she has no pain or discomfort at this time. Advised to follow up with office if anything changes.

## 2024-07-12 ENCOUNTER — Ambulatory Visit: Payer: Self-pay | Admitting: Gastroenterology

## 2024-07-12 LAB — SURGICAL PATHOLOGY

## 2024-07-16 NOTE — Progress Notes (Incomplete)
 History of Present Illness: Catherine Hancock is a 63 y.o. year old female here for follow-up of microscopic hematuria.  Past Medical History:  Diagnosis Date   Anal fissure    Asthma    Asthma    Chest pain    LHC 7/08: EF 60%, normal coronaries; ETT-Echo 6/11: EF 55-60%, no ischemia   Chronic back pain    Chronic headache    CTS (carpal tunnel syndrome)    bil   CTS (carpal tunnel syndrome)    Depression 05/28/2020   Hypothyroidism    IBS (irritable bowel syndrome)    Internal hemorrhoids    Migraines    OSA on CPAP 11/13/2017   Mild with AHI 13/hr    Pulmonary nodule, right    Status post dilation of esophageal narrowing    UTI (urinary tract infection)     Past Surgical History:  Procedure Laterality Date   BREAST EXCISIONAL BIOPSY Right    benign   breast nodule surg     right breast nodule and left lymph node removed in 1994   COLONOSCOPY  06/09/2012   Procedure: COLONOSCOPY;  Surgeon: Princella CHRISTELLA Nida, MD;  Location: [facility];  Service: Endoscopy;  Laterality: N/A;   EXCISION OF BREAST BIOPSY Left    2 benign lymph nodes removed   EYE SURGERY  2023   left eye   HEMORRHOID SURGERY     2007   LYMPH NODE BIOPSY     left breast   NASAL SINUS SURGERY     1992   TONSILLECTOMY     1981   TOTAL VAGINAL HYSTERECTOMY      Home Medications:  (Not in a hospital admission)   Allergies:  Allergies  Allergen Reactions   Gadolinium Derivatives Other (See Comments)    Severe pain, joints locked up   Latex Other (See Comments) and Shortness Of Breath    Asthma attack    Other Shortness Of Breath and Other (See Comments)    Severe pain, joints locked up gadolinium contrast-causes joints to lock up, severe pain Severe pain, joints locked up gadolinium contrast-causes joints to lock up, severe pain  Asthma attack   Fluticasone -Salmeterol Other (See Comments)    Blurry vision Blurry vision Blurry vision Blurry vision Blurry vision   Iodinated Contrast  Media Other (See Comments)    gadolinium contrast-causes joints to lock up, severe pain   Doxycycline Itching and Rash   Sulfamethoxazole-Trimethoprim Diarrhea and Nausea And Vomiting    REACTION: GI upset Other reaction(s): OTHER REACTION: GI upset Other reaction(s): OTHER Other reaction(s): OTHER    Family History  Problem Relation Age of Onset   Lymphoma Mother    Prostate cancer Father    COPD Father    Heart failure Father    Hypertension Father    Mitral valve prolapse Sister    Colon cancer Maternal Grandmother    Heart disease Maternal Grandfather    Heart disease Paternal Grandfather    Colon cancer Maternal Aunt        x 4   Breast cancer Maternal Aunt    Ovarian cancer Maternal Aunt    Colon cancer Maternal Uncle    Heart disease Paternal Uncle        x 4   Liver disease Neg Hx    Liver cancer Neg Hx     Social History:  reports that she has never smoked. She has never used smokeless tobacco. She reports that she does  not drink alcohol and does not use drugs.  ROS: A complete review of systems was performed.  All systems are negative except for pertinent findings as noted.  Physical Exam:  Vital signs in last 24 hours: @VSRANGES @ General:  Alert and oriented, No acute distress HEENT: Normocephalic, atraumatic Neck: No JVD or lymphadenopathy Cardiovascular: Regular rate  Lungs: Normal inspiratory/expiratory excursion Abdomen: Soft, nontender, nondistended, no abdominal masses Back: No CVA tenderness Extremities: No edema Neurologic: Grossly intact  I have reviewed prior pt notes  I have reviewed notes from referring/previous physicians  I have reviewed urinalysis results  I have independently reviewed prior imaging  I have reviewed prior urine culture   Impression/Assessment:  ***  Plan:  ***  Garnette HERO Kalei Mckillop 07/16/2024, 12:19 PM  Garnette HERO. Sammantha Mehlhaff MD

## 2024-07-17 ENCOUNTER — Ambulatory Visit: Admitting: Urology

## 2024-07-17 DIAGNOSIS — R3129 Other microscopic hematuria: Secondary | ICD-10-CM

## 2024-11-09 ENCOUNTER — Telehealth: Payer: Self-pay

## 2024-11-09 NOTE — Telephone Encounter (Signed)
 SABRA

## 2024-11-12 ENCOUNTER — Ambulatory Visit: Attending: Cardiology | Admitting: Cardiology

## 2024-11-12 VITALS — BP 98/68 | HR 77 | Ht 60.0 in | Wt 157.0 lb

## 2024-11-12 DIAGNOSIS — G4733 Obstructive sleep apnea (adult) (pediatric): Secondary | ICD-10-CM

## 2024-11-12 DIAGNOSIS — I10 Essential (primary) hypertension: Secondary | ICD-10-CM

## 2024-11-12 NOTE — Patient Instructions (Signed)
 Medication Instructions:  Your physician recommends that you continue on your current medications as directed. Please refer to the Current Medication list given to you today.  *If you need a refill on your cardiac medications before your next appointment, please call your pharmacy*  Lab Work: None.  If you have labs (blood work) drawn today and your tests are completely normal, you will receive your results only by: MyChart Message (if you have MyChart) OR A paper copy in the mail If you have any lab test that is abnormal or we need to change your treatment, we will call you to review the results.  Testing/Procedures: None.  Follow-Up: At [facility], you and your health needs are our priority.  As part of our continuing mission to provide you with exceptional heart care, our providers are all part of one team.  This team includes your primary Cardiologist (physician) and Advanced Practice Providers or APPs (Physician Assistants and Nurse Practitioners) who all work together to provide you with the care you need, when you need it.  Your next appointment:   1 year(s)  Provider:   Dr. Wilbert Bihari, MD   We recommend signing up for the patient portal called MyChart.  Sign up information is provided on this After Visit Summary.  MyChart is used to connect with patients for Virtual Visits (Telemedicine).  Patients are able to view lab/test results, encounter notes, upcoming appointments, etc.  Non-urgent messages can be sent to your provider as well.   To learn more about what you can do with MyChart, go to [email or web address].

## 2024-11-12 NOTE — Progress Notes (Signed)
 "    Sleep Medicine CONSULT Note    Date:  11/12/2024   ID:  Catherine Hancock, DOB 1960-11-03, MRN [id number]  PCP:  Toy Laurance POUR, MD  Cardiologist: Shelda Bruckner, MD   Chief Complaint  Patient presents with   New Patient (Initial Visit)    OSA    History of Present Illness:  Catherine Hancock is a 64 y.o. female who is being seen today for the evaluation of OSA at the request of Shelda Bruckner, MD.  This is a 64yo female with a hx of asthma and noncardiac chest pain who was referred for sleep study by Dr. Mady.  She apparently was complaining of significant snoring and her Epworth Sleepiness Scale was 13.  Was found to have mild obstructive sleep apnea with an AHI 13.1 and underwent CPAP titration to 13 cm H2O. I last saw her in 2021 and then was lost to followup.  SHe is now referred back for sleep medicine consult to reestablish care.  SHe is doing well with her PAP device.  She tolerates the nasal pillow mask and feels the pressure is adequate.  She has never felt rested when she gets up and says that she is not a morning person.  She feels tired during the day and has to nap some. She denies any significant nasal dryness or nasal congestion.  She has problems with mouth dryness.  She does not think that she snores. An Epworth Sleepiness Scale score was calculated the office today and this endorsed at 16 arguing for some residual daytime sleepiness. She goes to sleep around MN and gets up at 9AM and does not wake up during the night. Patient denies any episodes of bruxism, No hypnogognic hallucinations or cataplectic events.  Her device is over 37 years old and would like to get a new one.   Past Medical History:  Diagnosis Date   Anal fissure    Asthma    Asthma    Chest pain    LHC 7/08: EF 60%, normal coronaries; ETT-Echo 6/11: EF 55-60%, no ischemia   Chronic back pain    Chronic headache    CTS (carpal tunnel syndrome)    bil   CTS (carpal tunnel syndrome)     Depression 05/28/2020   Hypothyroidism    IBS (irritable bowel syndrome)    Internal hemorrhoids    Migraines    OSA on CPAP 11/13/2017   Mild with AHI 13/hr    Pulmonary nodule, right    Status post dilation of esophageal narrowing    UTI (urinary tract infection)     Past Surgical History:  Procedure Laterality Date   BREAST EXCISIONAL BIOPSY Right    benign   breast nodule surg     right breast nodule and left lymph node removed in 1994   COLONOSCOPY  06/09/2012   Procedure: COLONOSCOPY;  Surgeon: Princella CHRISTELLA Nida, MD;  Location: [facility];  Service: Endoscopy;  Laterality: N/A;   EXCISION OF BREAST BIOPSY Left    2 benign lymph nodes removed   EYE SURGERY  2023   left eye   HEMORRHOID SURGERY     2007   LYMPH NODE BIOPSY     left breast   NASAL SINUS SURGERY     1992   TONSILLECTOMY     1981   TOTAL VAGINAL HYSTERECTOMY      Current Medications: Active Medications[1]  Allergies:   Gadolinium derivatives, Latex, Other, Fluticasone -salmeterol, Iodinated contrast media, Doxycycline,  and Sulfamethoxazole-trimethoprim   Social History   Socioeconomic History   Marital status: Married    Spouse name: Manus   Number of children: 3   Years of education: 12   Highest education level: Not on file  Occupational History   Occupation: retired  Tobacco Use   Smoking status: Never   Smokeless tobacco: Never  Vaping Use   Vaping status: Never Used  Substance and Sexual Activity   Alcohol use: No   Drug use: No   Sexual activity: Yes    Partners: Male  Other Topics Concern   Not on file  Social History Narrative   Live at home with husband and son.   Right handed.   Caffeine use: drinks 1 pepsi/day    Social Drivers of Health   Tobacco Use: Low Risk (07/09/2024)   Patient History    Smoking Tobacco Use: Never    Smokeless Tobacco Use: Never    Passive Exposure: Not on file  Financial Resource Strain: Not on file  Food Insecurity: Not on file   Transportation Needs: Not on file  Physical Activity: Not on file  Stress: Not on file  Social Connections: Not on file  Depression (EYV7-0): Not on file  Alcohol Screen: Not on file  Housing: Not on file  Utilities: Not on file  Health Literacy: Not on file     Family History:  The patient's family history includes Breast cancer in her maternal aunt; COPD in her father; Colon cancer in her maternal aunt, maternal grandmother, and maternal uncle; Heart disease in her maternal grandfather, paternal grandfather, and paternal uncle; Heart failure in her father; Hypertension in her father; Lymphoma in her mother; Mitral valve prolapse in her sister; Ovarian cancer in her maternal aunt; Prostate cancer in her father.   ROS:   Please see the history of present illness.    ROS All other systems reviewed and are negative.      No data to display             PHYSICAL EXAM:   VS:  BP 98/68   Pulse 77   Ht 5' (1.524 m)   Wt 157 lb (71.2 kg)   SpO2 99%   BMI 30.66 kg/m    GEN: Well nourished, well developed, in no acute distress  HEENT: normal  Neck: no JVD, carotid bruits, or masses Cardiac: RRR; no murmurs, rubs, or gallops,no edema.  Intact distal pulses bilaterally.  Respiratory:  clear to auscultation bilaterally, normal work of breathing GI: soft, nontender, nondistended, + BS MS: no deformity or atrophy  Skin: warm and dry, no rash Neuro:  Alert and Oriented x 3, Strength and sensation are intact Psych: euthymic mood, full affect  Wt Readings from Last 3 Encounters:  11/12/24 157 lb (71.2 kg)  07/09/24 145 lb (65.8 kg)  06/06/24 145 lb (65.8 kg)      Studies/Labs Reviewed:   PAP download  Recent Labs: No results found for requested labs within last 365 days.      ASSESSMENT:    1. OSA (obstructive sleep apnea)   2. Essential hypertension      PLAN:  In order of problems listed above:  OSA - The patient is tolerating PAP therapy well without any  problems. The PAP download performed by his DME was personally reviewed and interpreted by me today and showed an AHI of 2.8/hr on 8 cm H2O with 63% compliance in using more than 4 hours nightly.  The patient  has been using and benefiting from PAP use and will continue to benefit from therapy.  -she has not used her device recently due to oral surgery so that is why compliance is down -I will order her a new ResMed Airsense 11 CPAP at 8cm H2O with heated humidity and mask of choice -she wants to change her DME to [facility]   HTN -BP controlled on exam today -continue Amlodipine 5mg  daily with PRN refills   Time Spent: 20 minutes total time of encounter, including 15 minutes spent in face-to-face patient care on the date of this encounter. This time includes coordination of care and counseling regarding above mentioned problem list. Remainder of non-face-to-face time involved reviewing chart documents/testing relevant to the patient encounter and documentation in the medical record. I have independently reviewed documentation from referring provider  Medication Adjustments/Labs and Tests Ordered: Current medicines are reviewed at length with the patient today.  Concerns regarding medicines are outlined above.  Medication changes, Labs and Tests ordered today are listed in the Patient Instructions below.  There are no Patient Instructions on file for this visit.   Signed, Wilbert Bihari, MD  11/12/2024 8:39 AM    [facility] [address] [place], [place], [place]  [address] Phone: [phone]; Fax: [phone]       [1]  Current Meds  Medication Sig   albuterol  (ACCUNEB ) 0.63 MG/3ML nebulizer solution Take 1 ampule by nebulization every 6 (six) hours as needed for wheezing or shortness of breath.   albuterol  (VENTOLIN  HFA) 108 (90 Base) MCG/ACT inhaler Inhale 2 puffs into the lungs every 6 (six) hours.   ALPRAZolam  (XANAX ) 0.5 MG tablet Take 0.25-0.5 mg by  mouth 2 (two) times daily as needed for anxiety. PRN   amLODipine (NORVASC) 5 MG tablet Take 5 mg by mouth daily.   aspirin  EC 81 MG tablet Take 1 tablet (81 mg total) by mouth daily.   budesonide -formoterol  (SYMBICORT ) 160-4.5 MCG/ACT inhaler Inhale 2 puffs into the lungs 2 (two) times daily.   Calcium Carb-Cholecalciferol (CALCIUM 600+D3) 600-800 MG-UNIT TABS Take 1 tablet by mouth daily.   citalopram  (CELEXA ) 20 MG tablet Take 20 mg by mouth at bedtime.   cyclobenzaprine  (FLEXERIL ) 10 MG tablet TAKE 1 TABLET THREE TIMES A DAY AS NEEDED FOR MUSCLE SPASMS (Patient taking differently: Take 10 mg by mouth at bedtime.)   diclofenac (VOLTAREN) 50 MG EC tablet Take 50 mg by mouth 2 (two) times daily as needed (hip pain).   DONNATAL  16.2 MG tablet Take 16.2 mg by mouth daily as needed (Stomach pain).    EPINEPHrine 0.3 mg/0.3 mL IJ SOAJ injection Inject 0.3 mg into the muscle as needed for anaphylaxis.    esomeprazole (NEXIUM) 40 MG capsule Take 40 mg by mouth 2 (two) times daily before a meal.    guaiFENesin  (MUCINEX ) 600 MG 12 hr tablet  (Patient taking differently: Take 600 mg by mouth 2 (two) times daily as needed.)   HYDROcodone -acetaminophen  (NORCO/VICODIN) 5-325 MG tablet Take 1 tablet by mouth every 6 (six) hours as needed for moderate pain.   ibuprofen  (ADVIL ,MOTRIN ) 800 MG tablet Take 800 mg by mouth every 8 (eight) hours as needed (pain).   levocetirizine (XYZAL) 5 MG tablet Take 1 tablet by mouth every evening.   levothyroxine  (SYNTHROID ) 50 MCG tablet Take 50 mcg by mouth daily before breakfast.   lubiprostone (AMITIZA) 24 MCG capsule Take 24 mcg by mouth 2 (two) times daily.   montelukast  (SINGULAIR ) 10 MG  tablet Take 10 mg by mouth at bedtime.   nitroGLYCERIN  (NITROSTAT ) 0.4 MG SL tablet Place 1 tablet (0.4 mg total) under the tongue every 5 (five) minutes as needed for chest pain.   polyethylene glycol powder (GLYCOLAX /MIRALAX ) powder Take 17 g by mouth as needed for mild constipation.     Prenatal Vit-Fe Fumarate-FA (M-NATAL PLUS) 27-1 MG TABS Take 1 tablet by mouth at bedtime.   promethazine  (PHENERGAN ) 25 MG tablet See Instructions, TAKE 1 TABLET TWICE A DAY AS NEEDED FOR NAUSEA/VOMITING, # 60 tab, 11 Refill(s), Pharmacy: [facility]   raloxifene (EVISTA) 60 MG tablet Take 1 tablet every day by oral route.   Vitamin D, Ergocalciferol, (DRISDOL) 1.25 MG (50000 UNIT) CAPS capsule Take 50,000 Units by mouth every Monday.   zolpidem  (AMBIEN ) 10 MG tablet Take 5 mg by mouth at bedtime as needed for sleep.   "

## 2024-11-16 ENCOUNTER — Telehealth: Payer: Self-pay | Admitting: *Deleted

## 2024-11-16 DIAGNOSIS — I10 Essential (primary) hypertension: Secondary | ICD-10-CM

## 2024-11-16 DIAGNOSIS — G4733 Obstructive sleep apnea (adult) (pediatric): Secondary | ICD-10-CM

## 2024-11-16 NOTE — Telephone Encounter (Signed)
-----   Message from Wilbert Bihari, MD sent at 11/12/2024  8:45 AM EST ----- order her a new ResMed Airsense 11 CPAP at 8cm H2O with heated humidity and mask of choice and PAP supplies  She wants to change her DME to [facility] in [place] Office [phone] FAX   # [phone]

## 2024-11-16 NOTE — Telephone Encounter (Signed)
 Order faxed to [facility] to [phone].  Upon patient request DME selection is [facility]. Patient understands he will be contacted by [facility] set up his cpap. Patient understands to call if [facility] does not contact him with new setup in a timely manner. Patient understands they will be called once confirmation has been received from [facility] that they have received their new machine to schedule 10 week follow up appointment.   [facility] notified of new cpap order  Please add to airview Patient was grateful for the call and thanked me.

## 2024-12-07 NOTE — Telephone Encounter (Signed)
 Called and spoke to Loyda at [facility] and she states they needed the patients insurance information and when I called today she says they just received it but then order and all paperwork was sent 11/16/24. Loyda says she will expedite the order and call the patient as soon as the authorization is approved.

## 2025-01-08 ENCOUNTER — Ambulatory Visit: Admitting: Urology

## 2025-04-11 ENCOUNTER — Ambulatory Visit: Admitting: Diagnostic Neuroimaging
